# Patient Record
Sex: Male | Born: 1990 | Race: Black or African American | Hispanic: No | Marital: Single | Smoking: Former smoker
Health system: Southern US, Community
[De-identification: ages and names within clinical notes are randomized; demographics above are authoritative.]

## PROBLEM LIST (undated history)

## (undated) DIAGNOSIS — I10 Essential (primary) hypertension: Secondary | ICD-10-CM

## (undated) DIAGNOSIS — I509 Heart failure, unspecified: Secondary | ICD-10-CM

## (undated) HISTORY — PX: THYROID SURGERY: SHX805

---
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
## 2010-06-09 ENCOUNTER — Emergency Department: Payer: Self-pay | Admitting: Emergency Medicine

## 2010-10-12 ENCOUNTER — Emergency Department: Payer: Self-pay | Admitting: Emergency Medicine

## 2011-05-08 ENCOUNTER — Emergency Department: Payer: Self-pay | Admitting: *Deleted

## 2011-05-08 LAB — COMPREHENSIVE METABOLIC PANEL
Albumin: 4.2 g/dL (ref 3.4–5.0)
Alkaline Phosphatase: 54 U/L (ref 50–136)
Anion Gap: 12 (ref 7–16)
Bilirubin,Total: 0.8 mg/dL (ref 0.2–1.0)
Chloride: 103 mmol/L (ref 98–107)
Creatinine: 0.9 mg/dL (ref 0.60–1.30)
EGFR (African American): >60
EGFR (Non-African Amer.): >60
Osmolality: 275 (ref 275–301)
SGOT(AST): 29 U/L (ref 15–37)
SGPT (ALT): 30 U/L
Total Protein: 8.2 g/dL (ref 6.4–8.2)

## 2011-05-08 LAB — URINALYSIS, COMPLETE
Bilirubin,UR: NEGATIVE
Glucose,UR: NEGATIVE mg/dL (ref 0–75)
Nitrite: NEGATIVE
Ph: 6 (ref 4.5–8.0)
Protein: NEGATIVE
Specific Gravity: 1.012 (ref 1.003–1.030)
Squamous Epithelial: 1
WBC UR: 398 /HPF (ref 0–5)

## 2011-05-08 LAB — CBC
HCT: 47.1 % (ref 40.0–52.0)
HGB: 15.6 g/dL (ref 13.0–18.0)
MCH: 28.8 pg (ref 26.0–34.0)
MCHC: 33.2 g/dL (ref 32.0–36.0)
Platelet: 190 10*3/uL (ref 150–440)
RBC: 5.43 10*6/uL (ref 4.40–5.90)
WBC: 14.1 10*3/uL — ABNORMAL HIGH (ref 3.8–10.6)

## 2011-05-10 LAB — URINE CULTURE

## 2011-05-14 LAB — CULTURE, BLOOD (SINGLE)

## 2012-02-02 ENCOUNTER — Emergency Department: Payer: Self-pay | Admitting: Emergency Medicine

## 2012-04-13 ENCOUNTER — Emergency Department: Payer: Self-pay | Admitting: Emergency Medicine

## 2012-04-13 LAB — BASIC METABOLIC PANEL
Anion Gap: 8 (ref 7–16)
BUN: 6 mg/dL — ABNORMAL LOW (ref 7–18)
Chloride: 103 mmol/L (ref 98–107)
Co2: 27 mmol/L (ref 21–32)
EGFR (African American): >60
Potassium: 3.5 mmol/L (ref 3.5–5.1)
Sodium: 138 mmol/L (ref 136–145)

## 2012-04-13 LAB — CBC WITH DIFFERENTIAL/PLATELET
Basophil #: 0.1 10*3/uL (ref 0.0–0.1)
Basophil %: 0.7 %
Eosinophil %: 0.5 %
HCT: 50.1 % (ref 40.0–52.0)
Lymphocyte #: 2.9 10*3/uL (ref 1.0–3.6)
Lymphocyte %: 23.8 %
MCH: 30.4 pg (ref 26.0–34.0)
MCHC: 34.6 g/dL (ref 32.0–36.0)
MCV: 88 fL (ref 80–100)
Monocyte #: 0.7 x10 3/mm (ref 0.2–1.0)
Monocyte %: 6 %
Neutrophil #: 8.3 10*3/uL — ABNORMAL HIGH (ref 1.4–6.5)
Platelet: 216 10*3/uL (ref 150–440)
RBC: 5.71 10*6/uL (ref 4.40–5.90)
RDW: 13.3 % (ref 11.5–14.5)
WBC: 12 10*3/uL — ABNORMAL HIGH (ref 3.8–10.6)

## 2013-04-14 ENCOUNTER — Emergency Department: Payer: Self-pay | Admitting: Emergency Medicine

## 2013-04-19 ENCOUNTER — Other Ambulatory Visit: Payer: Self-pay | Admitting: Ophthalmology

## 2013-04-26 LAB — WOUND CULTURE

## 2014-08-09 ENCOUNTER — Emergency Department
Admission: EM | Admit: 2014-08-09 | Discharge: 2014-08-09 | Disposition: A | Discharge status: Home / Self Care | Payer: Self-pay | Attending: Emergency Medicine | Admitting: Emergency Medicine

## 2014-08-09 ENCOUNTER — Emergency Department: Payer: Medicaid Other

## 2014-08-09 ENCOUNTER — Emergency Department: Payer: Self-pay

## 2014-08-09 ENCOUNTER — Encounter: Payer: Self-pay | Admitting: Emergency Medicine

## 2014-08-09 DIAGNOSIS — Z720 Tobacco use: Secondary | ICD-10-CM | POA: Insufficient documentation

## 2014-08-09 DIAGNOSIS — I10 Essential (primary) hypertension: Secondary | ICD-10-CM | POA: Insufficient documentation

## 2014-08-09 DIAGNOSIS — M79605 Pain in left leg: Secondary | ICD-10-CM | POA: Insufficient documentation

## 2014-08-09 DIAGNOSIS — M79604 Pain in right leg: Secondary | ICD-10-CM | POA: Insufficient documentation

## 2014-08-09 DIAGNOSIS — R0989 Other specified symptoms and signs involving the circulatory and respiratory systems: Secondary | ICD-10-CM | POA: Insufficient documentation

## 2014-08-09 LAB — CBC
HCT: 49 % (ref 40.0–52.0)
Hemoglobin: 16.4 g/dL (ref 13.0–18.0)
MCH: 28.8 pg (ref 26.0–34.0)
MCHC: 33.4 g/dL (ref 32.0–36.0)
MCV: 86.2 fL (ref 80.0–100.0)
PLATELETS: 208 10*3/uL (ref 150–440)
RBC: 5.69 MIL/uL (ref 4.40–5.90)
RDW: 14.1 % (ref 11.5–14.5)
WBC: 10.8 10*3/uL — ABNORMAL HIGH (ref 3.8–10.6)

## 2014-08-09 LAB — COMPREHENSIVE METABOLIC PANEL
ALBUMIN: 3.9 g/dL (ref 3.5–5.0)
ALK PHOS: 46 U/L (ref 38–126)
ALT: 21 U/L (ref 17–63)
AST: 17 U/L (ref 15–41)
Anion gap: 9 (ref 5–15)
BUN: 13 mg/dL (ref 6–20)
CALCIUM: 8.9 mg/dL (ref 8.9–10.3)
CO2: 25 mmol/L (ref 22–32)
Chloride: 105 mmol/L (ref 101–111)
Creatinine, Ser: 1.1 mg/dL (ref 0.61–1.24)
Glucose, Bld: 95 mg/dL (ref 65–99)
Potassium: 3.7 mmol/L (ref 3.5–5.1)
Sodium: 139 mmol/L (ref 135–145)
TOTAL PROTEIN: 7 g/dL (ref 6.5–8.1)
Total Bilirubin: 0.7 mg/dL (ref 0.3–1.2)

## 2014-08-09 LAB — FIBRIN DERIVATIVES D-DIMER (ARMC ONLY): Fibrin derivatives D-dimer (ARMC): 639.36 — ABNORMAL HIGH (ref 0–499)

## 2014-08-09 LAB — BRAIN NATRIURETIC PEPTIDE: B NATRIURETIC PEPTIDE 5: 214 pg/mL — AB (ref 0.0–100.0)

## 2014-08-09 MED ORDER — TRAMADOL HCL 50 MG PO TABS: 50.0000 mg | ORAL_TABLET | Freq: Four times a day (QID) | ORAL | Status: AC | PRN

## 2014-08-09 MED ORDER — KETOROLAC TROMETHAMINE 30 MG/ML IJ SOLN
30.0000 mg | Freq: Once | INTRAMUSCULAR | Status: AC
  Administered 2014-08-09: 30 mg via INTRAMUSCULAR
  Filled 2014-08-09: qty 1

## 2014-08-09 MED ORDER — IPRATROPIUM-ALBUTEROL 0.5-2.5 (3) MG/3ML IN SOLN
3.0000 mL | Freq: Once | RESPIRATORY_TRACT | Status: AC
  Administered 2014-08-09: 3 mL via RESPIRATORY_TRACT
  Filled 2014-08-09: qty 3

## 2014-08-09 MED ORDER — LISINOPRIL 20 MG PO TABS: 20.0000 mg | ORAL_TABLET | Freq: Every day | ORAL | Status: DC

## 2014-08-09 NOTE — ED Provider Notes (Signed)
[facility] Emergency Department Provider Note  ____________________________________________  Time seen: On arrival  I have reviewed the triage vital signs and the nursing notes.   HISTORY  Chief Complaint Shortness of Breath; Knee Pain; and Hemoptysis    HPI Terrance Hawkins is a 24 y.o. male who presents with complaints of bilateral knee pain and shortness of breath which seems to be worse at night. He feels that he is having tremendous sinus drainage at night which is causing him to cough. He notes that he did have a small amount of pinkish blood when he coughed once. He denies chest pain. No fevers no chills. He does smoke. 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 Is likely the cause of his knee pain     History reviewed. No pertinent past medical history.  There are no active problems to display for this patient.   History reviewed. No pertinent past surgical history.  No current outpatient prescriptions on file.  Allergies Review of patient's allergies indicates no known allergies.  History reviewed. No pertinent family history.  Social History History  Substance Use Topics  . Smoking status: Current Every Day Smoker  . Smokeless tobacco: Not on file  . Alcohol Use: Yes    Review of Systems  Constitutional: Negative for fever. Eyes: Negative for visual changes. ENT: Negative for sore throat Cardiovascular: Negative for chest pain. Respiratory: Positive for shortness of breath Gastrointestinal: Negative for abdominal pain, vomiting and diarrhea. Genitourinary: Negative for dysuria. Musculoskeletal: Negative for back pain. Positive for knee pain bilaterally Skin: Negative for rash. Neurological: Negative for headaches or focal weakness Psychiatric: No anxiety  10-point ROS otherwise negative.  ____________________________________________   PHYSICAL EXAM:  VITAL SIGNS: ED Triage  Vitals  Enc Vitals Group     BP 08/09/14 0923 150/115 mmHg     Pulse Rate 08/09/14 0923 95     Resp 08/09/14 0923 20     Temp 08/09/14 0923 98.3 F (36.8 C)     Temp Source 08/09/14 0923 Oral     SpO2 08/09/14 0923 100 %     Weight 08/09/14 0923 300 lb (136.079 kg)     Height 08/09/14 0923 5\' 7"  (1.702 m)     Head Cir --      Peak Flow --      Pain Score 08/09/14 0924 8     Pain Loc --      Pain Edu? --      Excl. in GC? --      Constitutional: Obese. Alert and oriented. Well appearing and in no distress. Eyes: Conjunctivae are normal.  ENT   Head: Normocephalic and atraumatic.   Mouth/Throat: Mucous membranes are moist. Cardiovascular: Normal rate, regular rhythm. Normal and symmetric distal pulses are present in all extremities. No murmurs, rubs, or gallops. Respiratory: Normal respiratory effort without tachypnea nor retractions. Breath sounds are clear and equal bilaterally.  Gastrointestinal: Soft and non-tender in all quadrants. No distention. There is no CVA tenderness. Genitourinary: deferred Musculoskeletal: Nontender with normal range of motion in all extremities. Severe valgus abnormalities of bilateral extremities. 2+ distal pulses bilaterally Neurologic:  Normal speech and language. No gross focal neurologic deficits are appreciated. Skin:  Skin is warm, dry and intact. No rash noted. Psychiatric: Mood and affect are normal. Patient exhibits appropriate insight and judgment.  ____________________________________________    LABS (pertinent positives/negatives)  Labs Reviewed - No data to display  ____________________________________________   EKG  None  ____________________________________________    RADIOLOGY I have personally reviewed any xrays that were ordered on this patient: Enlarged heart and pulmonary vascular congestion ____________________________________________   PROCEDURES  Procedure(s) performed: none  Critical Care  performed: none  ____________________________________________   INITIAL IMPRESSION / ASSESSMENT AND PLAN / ED COURSE  Pertinent labs & imaging results that were available during my care of the patient were reviewed by me and considered in my medical decision making (see chart for details).  Patient well-appearing with no evidence of hypoxia. Differential includes sinusitis, upper respiratory infection, bronchospasm, sleep apnea. We'll give DuoNeb and check a chest x-ray and reevaluate  ----------------------------------------- 12:51 PM on 08/09/2014 -----------------------------------------  Patient with mildly elevated BNP and mildly elevated d-dimer. I do not believe the patient has a blood clot given no tachycardia and symptoms more consistent consistent with pulmonary vascular congestion given PND. Had a long discussion with patient regarding the need for regular visits with his physician, blood pressure control, smoking cessation, diet adjustment and increasing exercise.  ____________________________________________   FINAL CLINICAL IMPRESSION(S) / ED DIAGNOSES  Final diagnoses:  Essential hypertension  Pulmonary vascular congestion  Leg pain, bilateral     Jene Every, MD 08/09/14 1620

## 2014-08-09 NOTE — Discharge Instructions (Signed)

## 2014-08-09 NOTE — ED Notes (Signed)
Pt complaining of right leg pain (offered other means to reduce pain) . Has significant musculoskeletal history/deformity. Will continue to monitor.

## 2014-08-09 NOTE — ED Notes (Signed)
Patient to ED with report of increasing shortness of breath, especially at night when he lays down as well as bilateral knee pain. Reports symptoms have been going on for a couple of weeks.

## 2014-08-12 ENCOUNTER — Emergency Department: Payer: Medicaid Other

## 2014-08-12 ENCOUNTER — Other Ambulatory Visit: Payer: Self-pay

## 2014-08-12 ENCOUNTER — Encounter: Payer: Self-pay | Admitting: Medical Oncology

## 2014-08-12 ENCOUNTER — Emergency Department
Admission: EM | Admit: 2014-08-12 | Discharge: 2014-08-12 | Discharge status: Against Medical Advice | Payer: Medicaid Other | Attending: Emergency Medicine | Admitting: Emergency Medicine

## 2014-08-12 DIAGNOSIS — J810 Acute pulmonary edema: Secondary | ICD-10-CM | POA: Insufficient documentation

## 2014-08-12 DIAGNOSIS — I10 Essential (primary) hypertension: Secondary | ICD-10-CM | POA: Insufficient documentation

## 2014-08-12 DIAGNOSIS — Z720 Tobacco use: Secondary | ICD-10-CM | POA: Insufficient documentation

## 2014-08-12 DIAGNOSIS — I160 Hypertensive urgency: Secondary | ICD-10-CM

## 2014-08-12 DIAGNOSIS — R0600 Dyspnea, unspecified: Secondary | ICD-10-CM

## 2014-08-12 HISTORY — DX: Essential (primary) hypertension: I10

## 2014-08-12 LAB — COMPREHENSIVE METABOLIC PANEL
ALT: 40 U/L (ref 17–63)
AST: 33 U/L (ref 15–41)
Albumin: 3.9 g/dL (ref 3.5–5.0)
Alkaline Phosphatase: 43 U/L (ref 38–126)
Anion gap: 5 (ref 5–15)
BUN: 12 mg/dL (ref 6–20)
CHLORIDE: 110 mmol/L (ref 101–111)
CO2: 24 mmol/L (ref 22–32)
CREATININE: 0.98 mg/dL (ref 0.61–1.24)
Calcium: 8.6 mg/dL — ABNORMAL LOW (ref 8.9–10.3)
GFR calc non Af Amer: >60 mL/min (ref >60)
GLUCOSE: 96 mg/dL (ref 65–99)
POTASSIUM: 3.3 mmol/L — AB (ref 3.5–5.1)
Sodium: 139 mmol/L (ref 135–145)
TOTAL PROTEIN: 6.7 g/dL (ref 6.5–8.1)
Total Bilirubin: 1 mg/dL (ref 0.3–1.2)

## 2014-08-12 LAB — CBC
HCT: 45.5 % (ref 40.0–52.0)
HEMOGLOBIN: 15.3 g/dL (ref 13.0–18.0)
MCH: 28.6 pg (ref 26.0–34.0)
MCHC: 33.6 g/dL (ref 32.0–36.0)
MCV: 85.2 fL (ref 80.0–100.0)
PLATELETS: 208 10*3/uL (ref 150–440)
RBC: 5.34 MIL/uL (ref 4.40–5.90)
RDW: 14 % (ref 11.5–14.5)
WBC: 10.5 10*3/uL (ref 3.8–10.6)

## 2014-08-12 LAB — APTT: aPTT: 31 seconds (ref 24–36)

## 2014-08-12 LAB — TROPONIN I: TROPONIN I: 0.03 ng/mL (ref <0.031)

## 2014-08-12 MED ORDER — LEVOFLOXACIN IN D5W 750 MG/150ML IV SOLN: 750.0000 mg | Freq: Once | INTRAVENOUS | Status: DC

## 2014-08-12 MED ORDER — CLONIDINE HCL 0.1 MG PO TABS
0.1000 mg | ORAL_TABLET | Freq: Once | ORAL | Status: AC
  Administered 2014-08-12: 0.1 mg via ORAL
  Filled 2014-08-12: qty 1

## 2014-08-12 MED ORDER — FUROSEMIDE 40 MG PO TABS
20.0000 mg | ORAL_TABLET | Freq: Once | ORAL | Status: AC
  Administered 2014-08-12: 20 mg via ORAL
  Filled 2014-08-12: qty 1

## 2014-08-12 MED ORDER — LABETALOL HCL 5 MG/ML IV SOLN: 10.0000 mg | Freq: Once | INTRAVENOUS | Status: DC

## 2014-08-12 MED ORDER — FUROSEMIDE 10 MG/ML IJ SOLN: 40.0000 mg | Freq: Once | INTRAMUSCULAR | Status: DC

## 2014-08-12 MED ORDER — FUROSEMIDE 20 MG PO TABS: 20.0000 mg | ORAL_TABLET | Freq: Every day | ORAL | Status: DC

## 2014-08-12 MED ORDER — CLONIDINE HCL 0.1 MG PO TABS: 0.1000 mg | ORAL_TABLET | Freq: Two times a day (BID) | ORAL | Status: DC

## 2014-08-12 MED ORDER — IOHEXOL 350 MG/ML SOLN
100.0000 mL | Freq: Once | INTRAVENOUS | Status: AC | PRN
  Administered 2014-08-12: 100 mL via INTRAVENOUS

## 2014-08-12 NOTE — Discharge Instructions (Signed)
Hypertension °Hypertension, commonly called high blood pressure, is when the force of blood pumping through your arteries is too strong. Your arteries are the blood vessels that carry blood from your heart throughout your body. A blood pressure reading consists of a higher number over a lower number, such as 110/72. The higher number (systolic) is the pressure inside your arteries when your heart pumps. The lower number (diastolic) is the pressure inside your arteries when your heart relaxes. Ideally you want your blood pressure below 120/80. °Hypertension forces your heart to work harder to pump blood. Your arteries may become narrow or stiff. Having hypertension puts you at risk for heart disease, stroke, and other problems.  °RISK FACTORS °Some risk factors for high blood pressure are controllable. Others are not.  °Risk factors you cannot control include:  °· Race. You may be at higher risk if you are African American. °· Age. Risk increases with age. °· Gender. Men are at higher risk than women before age 45 years. After age 65, women are at higher risk than men. °Risk factors you can control include: °· Not getting enough exercise or physical activity. °· Being overweight. °· Getting too much fat, sugar, calories, or salt in your diet. °· Drinking too much alcohol. °SIGNS AND SYMPTOMS °Hypertension does not usually cause signs or symptoms. Extremely high blood pressure (hypertensive crisis) may cause headache, anxiety, shortness of breath, and nosebleed. °DIAGNOSIS  °To check if you have hypertension, your health care provider will measure your blood pressure while you are seated, with your arm held at the level of your heart. It should be measured at least twice using the same arm. Certain conditions can cause a difference in blood pressure between your right and left arms. A blood pressure reading that is higher than normal on one occasion does not mean that you need treatment. If one blood pressure reading  is high, ask your health care provider about having it checked again. °TREATMENT  °Treating high blood pressure includes making lifestyle changes and possibly taking medicine. Living a healthy lifestyle can help lower high blood pressure. You may need to change some of your habits. °Lifestyle changes may include: °· Following the DASH diet. This diet is high in fruits, vegetables, and whole grains. It is low in salt, red meat, and added sugars. °· Getting at least 2½ hours of brisk physical activity every week. °· Losing weight if necessary. °· Not smoking. °· Limiting alcoholic beverages. °· Learning ways to reduce stress. ° If lifestyle changes are not enough to get your blood pressure under control, your health care provider may prescribe medicine. You may need to take more than one. Work closely with your health care provider to understand the risks and benefits. °HOME CARE INSTRUCTIONS °· Have your blood pressure rechecked as directed by your health care provider.   °· Take medicines only as directed by your health care provider. Follow the directions carefully. Blood pressure medicines must be taken as prescribed. The medicine does not work as well when you skip doses. Skipping doses also puts you at risk for problems.   °· Do not smoke.   °· Monitor your blood pressure at home as directed by your health care provider.  °SEEK MEDICAL CARE IF:  °· You think you are having a reaction to medicines taken. °· You have recurrent headaches or feel dizzy. °· You have swelling in your ankles. °· You have trouble with your vision. °SEEK IMMEDIATE MEDICAL CARE IF: °· You develop a severe headache or confusion. °·   You have unusual weakness, numbness, or feel faint.  You have severe chest or abdominal pain.  You vomit repeatedly.  You have trouble breathing. MAKE SURE YOU:   Understand these instructions.  Will watch your condition.  Will get help right away if you are not doing well or get worse. Document  Released: 01/10/2005 Document Revised: 05/27/2013 Document Reviewed: 11/02/2012 [facility] Patient Information 2015 [place], [place]. This information is not intended to replace advice given to you by your health care provider. Make sure you discuss any questions you have with your health care provider.  Pulmonary Edema Pulmonary edema is abnormal fluid buildup in the lungs that can make it hard to breathe. HOME CARE  Talk to your doctor about an exercise program.  Eat a healthy diet:  Eat fresh fruits, vegetables, and lean meats.  Limit high fat and salty foods.  Avoid processed, canned, or fried foods.  Avoid fast food.  Follow your doctor's advice about taking medicine and recording the medicine you take.  Follow your doctor's advice about keeping a record of your weight.  Talk to your doctor about keeping track of your blood pressure.  Do not smoke.  Do not use nicotine patches or nicotine gum.  Make a follow-up appointment with your doctor.  Ask your doctor for a copy of your latest heart tracing (ECG) and keep a copy with you at all times. GET HELP RIGHT AWAY IF:   You have chest pain. THIS IS AN EMERGENCY. Do not wait to see if the pain will go away. Call for local emergency medical help. Do not drive yourself to the hospital.  You have sweating, feel sick to your stomach (nauseous), or are experiencing shortness of breath.  Your weight increases more than your doctor tells you it should.  You start to have shortness of breath.  You notice more swelling in your hands, feet, ankles, or belly.  You have dizziness, blurred vision, headache, or unsteadiness that does not go away.  You cough up bloody spit.  You have a cough that does not go away.  You are unable to sleep because it is hard to breathe.  You begin to feel a "jumping" or "fluttering" sensation (palpitations) in the chest that is unusual for you. MAKE SURE YOU:   Understand these  instructions.  Will watch your condition.  Will get help right away if you are not doing well or get worse. Document Released: 12/29/2008 Document Revised: 01/15/2013 Document Reviewed: 09/17/2012 [facility] Patient Information 2015 [place], [place]. This information is not intended to replace advice given to you by your health care provider. Make sure you discuss any questions you have with your health care provider.

## 2014-08-12 NOTE — ED Provider Notes (Addendum)
[facility] Emergency Department Provider Note     Time seen: ----------------------------------------- 10:21 AM on 08/12/2014 -----------------------------------------    I have reviewed the triage vital signs and the nursing notes.   HISTORY  Chief Complaint Shortness of Breath    HPI Terrance Hawkins is a 24 y.o. male who presents to ER with severe dyspnea on exertion. Patient states she was seen several days ago with shortness of breath and had some blood pressure medication adjustments, he continues to have severe shortness of breath. He has significant orthopnea to the point where he has to sleep sitting up. States his only had a couple doses of lisinopril that he was given a discharge last time. He states the right knee and right leg gives him significant pain, thinks he may be putting on extra fluid.   Past Medical History  Diagnosis Date  . Hypertension     There are no active problems to display for this patient.   History reviewed. No pertinent past surgical history.  Allergies Review of patient's allergies indicates no known allergies.  Social History History  Substance Use Topics  . Smoking status: Current Every Day Smoker  . Smokeless tobacco: Not on file  . Alcohol Use: Yes    Review of Systems Constitutional: Negative for fever. Eyes: Negative for visual changes. ENT: Negative for sore throat. Cardiovascular: Negative for chest pain. Respiratory: Positive for shortness of breath Gastrointestinal: Negative for abdominal pain, vomiting and diarrhea. Genitourinary: Negative for dysuria. Musculoskeletal: Negative for back pain. Positive for leg pain, worse on the right Skin: Negative for rash. Neurological: Negative for headaches, focal weakness or numbness.  10-point ROS otherwise negative.  ____________________________________________   PHYSICAL EXAM:  VITAL SIGNS: ED Triage Vitals  Enc Vitals Group     BP  08/12/14 1015 188/113 mmHg     Pulse Rate 08/12/14 1015 96     Resp 08/12/14 1015 20     Temp 08/12/14 1015 98 F (36.7 C)     Temp Source 08/12/14 1015 Oral     SpO2 08/12/14 1015 96 %     Weight 08/12/14 1015 300 lb (136.079 kg)     Height 08/12/14 1015  (1.702 m)     Head Cir --      Peak Flow --      Pain Score 08/12/14 1016 10     Pain Loc --      Pain Edu? --      Excl. in GC? --     Constitutional: Alert and oriented. Mild distress Eyes: Conjunctivae are normal. PERRL. Normal extraocular movements. ENT   Head: Normocephalic and atraumatic.   Nose: No congestion/rhinnorhea.   Mouth/Throat: Mucous membranes are moist.   Neck: No stridor. Cardiovascular: Normal rate, regular rhythm. Normal and symmetric distal pulses are present in all extremities. No murmurs, rubs, or gallops. Respiratory: Mild tachypnea, Breath sounds are clear and equal bilaterally. No wheezes/rales/rhonchi. Gastrointestinal: Soft and nontender. No distention. No abdominal bruits. There is no CVA tenderness. Musculoskeletal: Nontender with normal range of motion in all extremities. No joint effusions.  No lower extremity tenderness nor edema. Neurologic:  Normal speech and language. No gross focal neurologic deficits are appreciated. Speech is normal. No gait instability. Skin:  Skin is warm, dry and intact. No rash noted. Psychiatric: Mood and affect are normal. Speech and behavior are normal. Patient exhibits appropriate insight and judgment. ____________________________________________  EKG: Interpreted by me. Normal sinus rhythm with a rate of 88 bpm,  septal infarct age indeterminate, normal axis. Normal QT interval.  ____________________________________________  ED COURSE:  Pertinent labs & imaging results that were available during my care of the patient were reviewed by me and considered in my medical decision making (see chart for details). We'll probably go ahead and CT the  chest, recheck cardiac labs. ____________________________________________    LABS (pertinent positives/negatives)  Labs Reviewed  COMPREHENSIVE METABOLIC PANEL - Abnormal; Notable for the following:    Potassium 3.3 (*)    Calcium 8.6 (*)    All other components within normal limits  CBC  TROPONIN I  APTT  BRAIN NATRIURETIC PEPTIDE    RADIOLOGY Images were viewed by me  CT angiogram reveals patchy groundglass consolidations throughout both lungs   ____________________________________________  FINAL ASSESSMENT AND PLAN  Dyspnea and orthopnea, pulmonary edema  Plan: Patient with labs and imaging as dictated above. CT findings would explain his dyspnea. Patient also severely short of breath, with hypertensive urgency. Will start on blood pressure medications as well as Lasix. Patient would likely need observation and echocardiogram.  Patient is advised me he cannot stay in the hospital due to child care needs. I advised him I cannot assure his safety, patient remains very short of breath. He'll be started on clonidine and Lasix here. But he is refusing hospitalization.   Emily FilbertWilliams, [place] E, MD   Emily FilbertJonathan E Pranay Hilbun, MD 08/12/14 [id number]  Emily FilbertJonathan E Nahome Bublitz, MD 08/12/14 1226

## 2014-08-12 NOTE — ED Notes (Signed)
PT ambulatory to triage with reports that he was seen here a few days ago with sob but states that he has continued to have sob. Pt states that he was placed on BP med upon discharge. Denies chest pain.

## 2014-08-12 NOTE — ED Notes (Signed)
Pt states he has been coughing up blood tinged sputum

## 2014-09-07 ENCOUNTER — Emergency Department: Payer: Self-pay

## 2014-09-07 ENCOUNTER — Inpatient Hospital Stay
Admission: EM | Admit: 2014-09-07 | Discharge: 2014-09-09 | DRG: 292 | Disposition: A | Discharge status: Home / Self Care | Payer: Self-pay | Attending: Internal Medicine | Admitting: Internal Medicine

## 2014-09-07 ENCOUNTER — Inpatient Hospital Stay
Admit: 2014-09-07 | Discharge: 2014-09-07 | Disposition: A | Discharge status: Home / Self Care | Payer: Self-pay | Attending: Internal Medicine | Admitting: Internal Medicine

## 2014-09-07 ENCOUNTER — Encounter: Payer: Self-pay | Admitting: Emergency Medicine

## 2014-09-07 DIAGNOSIS — I10 Essential (primary) hypertension: Secondary | ICD-10-CM

## 2014-09-07 DIAGNOSIS — E876 Hypokalemia: Secondary | ICD-10-CM

## 2014-09-07 DIAGNOSIS — Z720 Tobacco use: Secondary | ICD-10-CM

## 2014-09-07 DIAGNOSIS — I429 Cardiomyopathy, unspecified: Secondary | ICD-10-CM | POA: Diagnosis present

## 2014-09-07 DIAGNOSIS — Z9114 Patient's other noncompliance with medication regimen: Secondary | ICD-10-CM | POA: Diagnosis present

## 2014-09-07 DIAGNOSIS — Z6841 Body Mass Index (BMI) 40.0 and over, adult: Secondary | ICD-10-CM

## 2014-09-07 DIAGNOSIS — F1210 Cannabis abuse, uncomplicated: Secondary | ICD-10-CM

## 2014-09-07 DIAGNOSIS — I5041 Acute combined systolic (congestive) and diastolic (congestive) heart failure: Principal | ICD-10-CM

## 2014-09-07 DIAGNOSIS — F1410 Cocaine abuse, uncomplicated: Secondary | ICD-10-CM

## 2014-09-07 DIAGNOSIS — I509 Heart failure, unspecified: Secondary | ICD-10-CM

## 2014-09-07 DIAGNOSIS — R0602 Shortness of breath: Secondary | ICD-10-CM

## 2014-09-07 DIAGNOSIS — J810 Acute pulmonary edema: Secondary | ICD-10-CM

## 2014-09-07 DIAGNOSIS — G4733 Obstructive sleep apnea (adult) (pediatric): Secondary | ICD-10-CM

## 2014-09-07 LAB — COMPREHENSIVE METABOLIC PANEL
ALT: 19 U/L (ref 17–63)
AST: 17 U/L (ref 15–41)
Albumin: 4 g/dL (ref 3.5–5.0)
Alkaline Phosphatase: 45 U/L (ref 38–126)
Anion gap: 7 (ref 5–15)
BUN: 10 mg/dL (ref 6–20)
CALCIUM: 9 mg/dL (ref 8.9–10.3)
CHLORIDE: 106 mmol/L (ref 101–111)
CO2: 26 mmol/L (ref 22–32)
Creatinine, Ser: 1.09 mg/dL (ref 0.61–1.24)
GFR calc Af Amer: >60 mL/min (ref >60)
Glucose, Bld: 96 mg/dL (ref 65–99)
POTASSIUM: 3.3 mmol/L — AB (ref 3.5–5.1)
SODIUM: 139 mmol/L (ref 135–145)
Total Bilirubin: 1.1 mg/dL (ref 0.3–1.2)
Total Protein: 7.1 g/dL (ref 6.5–8.1)

## 2014-09-07 LAB — CBC WITH DIFFERENTIAL/PLATELET
BASOS PCT: 1 %
Basophils Absolute: 0.1 10*3/uL (ref 0–0.1)
EOS PCT: 1 %
Eosinophils Absolute: 0.1 10*3/uL (ref 0–0.7)
HCT: 46.9 % (ref 40.0–52.0)
Hemoglobin: 15.6 g/dL (ref 13.0–18.0)
LYMPHS PCT: 21 %
Lymphs Abs: 2.1 10*3/uL (ref 1.0–3.6)
MCH: 28.4 pg (ref 26.0–34.0)
MCHC: 33.3 g/dL (ref 32.0–36.0)
MCV: 85.5 fL (ref 80.0–100.0)
MONO ABS: 1 10*3/uL (ref 0.2–1.0)
Monocytes Relative: 10 %
NEUTROS ABS: 6.8 10*3/uL — AB (ref 1.4–6.5)
Neutrophils Relative %: 67 %
Platelets: 215 10*3/uL (ref 150–440)
RBC: 5.49 MIL/uL (ref 4.40–5.90)
RDW: 14.1 % (ref 11.5–14.5)
WBC: 10 10*3/uL (ref 3.8–10.6)

## 2014-09-07 LAB — URINE DRUG SCREEN, QUALITATIVE (ARMC ONLY)
AMPHETAMINES, UR SCREEN: NOT DETECTED
Barbiturates, Ur Screen: NOT DETECTED
Benzodiazepine, Ur Scrn: NOT DETECTED
Cannabinoid 50 Ng, Ur ~~LOC~~: POSITIVE — AB
Cocaine Metabolite,Ur ~~LOC~~: POSITIVE — AB
MDMA (Ecstasy)Ur Screen: NOT DETECTED
METHADONE SCREEN, URINE: NOT DETECTED
OPIATE, UR SCREEN: NOT DETECTED
Phencyclidine (PCP) Ur S: NOT DETECTED
Tricyclic, Ur Screen: NOT DETECTED

## 2014-09-07 LAB — TROPONIN I: TROPONIN I: 0.03 ng/mL (ref <0.031)

## 2014-09-07 LAB — TSH: TSH: 0.824 u[IU]/mL (ref 0.350–4.500)

## 2014-09-07 LAB — BRAIN NATRIURETIC PEPTIDE: B NATRIURETIC PEPTIDE 5: 267 pg/mL — AB (ref 0.0–100.0)

## 2014-09-07 LAB — HEMOGLOBIN A1C: Hgb A1c MFr Bld: 5.3 % (ref 4.0–6.0)

## 2014-09-07 MED ORDER — FUROSEMIDE 10 MG/ML IJ SOLN
20.0000 mg | Freq: Once | INTRAMUSCULAR | Status: AC
  Administered 2014-09-07: 20 mg via INTRAVENOUS
  Filled 2014-09-07: qty 4

## 2014-09-07 MED ORDER — FUROSEMIDE 20 MG PO TABS
20.0000 mg | ORAL_TABLET | Freq: Two times a day (BID) | ORAL | Status: DC
  Administered 2014-09-07 – 2014-09-08 (×2): 20 mg via ORAL
  Filled 2014-09-07 (×2): qty 1

## 2014-09-07 MED ORDER — SODIUM CHLORIDE 0.9 % IJ SOLN
3.0000 mL | INTRAMUSCULAR | Status: DC | PRN
  Administered 2014-09-08 – 2014-09-09 (×2): 3 mL via INTRAVENOUS
  Filled 2014-09-07 (×2): qty 10

## 2014-09-07 MED ORDER — ACETAMINOPHEN 650 MG RE SUPP: 650.0000 mg | Freq: Four times a day (QID) | RECTAL | Status: DC | PRN

## 2014-09-07 MED ORDER — SODIUM CHLORIDE 0.9 % IJ SOLN
3.0000 mL | Freq: Two times a day (BID) | INTRAMUSCULAR | Status: DC
  Administered 2014-09-07: 3 mL via INTRAVENOUS

## 2014-09-07 MED ORDER — NITROGLYCERIN 2 % TD OINT
1.0000 [in_us] | TOPICAL_OINTMENT | Freq: Four times a day (QID) | TRANSDERMAL | Status: DC
  Administered 2014-09-07 – 2014-09-09 (×5): 1 [in_us] via TOPICAL
  Filled 2014-09-07 (×5): qty 1

## 2014-09-07 MED ORDER — NICOTINE 21 MG/24HR TD PT24
21.0000 mg | MEDICATED_PATCH | Freq: Every day | TRANSDERMAL | Status: DC
  Administered 2014-09-07 – 2014-09-09 (×3): 21 mg via TRANSDERMAL
  Filled 2014-09-07 (×3): qty 1

## 2014-09-07 MED ORDER — HEPARIN SODIUM (PORCINE) 5000 UNIT/ML IJ SOLN
5000.0000 [IU] | Freq: Three times a day (TID) | INTRAMUSCULAR | Status: DC
  Administered 2014-09-07 – 2014-09-09 (×5): 5000 [IU] via SUBCUTANEOUS
  Filled 2014-09-07 (×5): qty 1

## 2014-09-07 MED ORDER — DOCUSATE SODIUM 100 MG PO CAPS
100.0000 mg | ORAL_CAPSULE | Freq: Two times a day (BID) | ORAL | Status: DC
  Administered 2014-09-07 – 2014-09-09 (×4): 100 mg via ORAL
  Filled 2014-09-07 (×5): qty 1

## 2014-09-07 MED ORDER — LABETALOL HCL 5 MG/ML IV SOLN
INTRAVENOUS | Status: AC
  Administered 2014-09-07: 10 mg via INTRAVENOUS
  Filled 2014-09-07: qty 4

## 2014-09-07 MED ORDER — POTASSIUM CHLORIDE CRYS ER 20 MEQ PO TBCR
40.0000 meq | EXTENDED_RELEASE_TABLET | Freq: Two times a day (BID) | ORAL | Status: DC
  Administered 2014-09-08 – 2014-09-09 (×3): 40 meq via ORAL
  Filled 2014-09-07 (×3): qty 2

## 2014-09-07 MED ORDER — FUROSEMIDE 20 MG PO TABS
20.0000 mg | ORAL_TABLET | Freq: Every day | ORAL | Status: DC
  Administered 2014-09-07: 20 mg via ORAL
  Filled 2014-09-07: qty 1

## 2014-09-07 MED ORDER — ONDANSETRON HCL 4 MG PO TABS: 4.0000 mg | ORAL_TABLET | Freq: Four times a day (QID) | ORAL | Status: DC | PRN

## 2014-09-07 MED ORDER — ACETAMINOPHEN 325 MG PO TABS
650.0000 mg | ORAL_TABLET | Freq: Four times a day (QID) | ORAL | Status: DC | PRN
  Administered 2014-09-08: 650 mg via ORAL
  Filled 2014-09-07: qty 2

## 2014-09-07 MED ORDER — LISINOPRIL 20 MG PO TABS
20.0000 mg | ORAL_TABLET | Freq: Two times a day (BID) | ORAL | Status: DC
  Administered 2014-09-07 – 2014-09-09 (×4): 20 mg via ORAL
  Filled 2014-09-07 (×4): qty 1

## 2014-09-07 MED ORDER — CARVEDILOL 12.5 MG PO TABS
12.5000 mg | ORAL_TABLET | Freq: Two times a day (BID) | ORAL | Status: DC
  Administered 2014-09-07 – 2014-09-08 (×2): 12.5 mg via ORAL
  Filled 2014-09-07 (×2): qty 1

## 2014-09-07 MED ORDER — ONDANSETRON HCL 4 MG/2ML IJ SOLN: 4.0000 mg | Freq: Four times a day (QID) | INTRAMUSCULAR | Status: DC | PRN

## 2014-09-07 MED ORDER — MORPHINE SULFATE 2 MG/ML IJ SOLN
2.0000 mg | INTRAMUSCULAR | Status: DC | PRN
  Administered 2014-09-07 – 2014-09-09 (×3): 2 mg via INTRAVENOUS
  Filled 2014-09-07 (×3): qty 1

## 2014-09-07 MED ORDER — LABETALOL HCL 5 MG/ML IV SOLN
10.0000 mg | Freq: Once | INTRAVENOUS | Status: AC
Start: 2014-09-07 — End: 2014-09-07
  Administered 2014-09-07: 10 mg via INTRAVENOUS

## 2014-09-07 MED ORDER — POTASSIUM CHLORIDE CRYS ER 20 MEQ PO TBCR
40.0000 meq | EXTENDED_RELEASE_TABLET | Freq: Once | ORAL | Status: AC
  Administered 2014-09-07: 40 meq via ORAL
  Filled 2014-09-07: qty 2

## 2014-09-07 MED ORDER — CLONIDINE HCL 0.1 MG PO TABS: 0.1000 mg | ORAL_TABLET | Freq: Two times a day (BID) | ORAL | Status: DC

## 2014-09-07 MED ORDER — LISINOPRIL 20 MG PO TABS
20.0000 mg | ORAL_TABLET | Freq: Every day | ORAL | Status: DC
  Administered 2014-09-07: 20 mg via ORAL
  Filled 2014-09-07: qty 1

## 2014-09-07 NOTE — H&P (Addendum)
[facility] [place] [facility]   PATIENT NAME: Terrance Hawkins    MR#:  [id number]  DATE OF BIRTH:  1990/09/03  SUBJECTIVE:  CHIEF COMPLAINT:   Chief Complaint  Patient presents with  . Shortness of Breath   patient is a 24 year old African American male testicle history significant for laser of nontreated obstructive sleep apnea, history of hypertension, obesity who presents to the hospital with complaints of significant shortness of breath, lower extremity swelling as well as orthopnea and PND. Chest x-ray reveals congestive heart failure and he is admitted for diuresis. Feels a little bit better today. Admits of having some chest pains prior to coming to the hospital, radiating to the left neck area  Review of Systems  Constitutional: Negative for fever, chills and weight loss.  HENT: Negative for congestion.   Eyes: Negative for blurred vision and double vision.  Respiratory: Positive for cough and shortness of breath. Negative for sputum production and wheezing.   Cardiovascular: Positive for chest pain, orthopnea, leg swelling and PND. Negative for palpitations.  Gastrointestinal: Negative for nausea, vomiting, abdominal pain, diarrhea, constipation and blood in stool.  Genitourinary: Negative for dysuria, urgency, frequency and hematuria.  Musculoskeletal: Negative for falls.  Neurological: Negative for dizziness, tremors, focal weakness and headaches.  Endo/Heme/Allergies: Does not bruise/bleed easily.  Psychiatric/Behavioral: Negative for depression. The patient does not have insomnia.     VITAL SIGNS: Blood pressure 171/89, pulse 89, temperature 97.7 F (36.5 C), temperature source Oral, resp. rate 16, height  (1.702 m), weight 136.079 kg (300 lb), SpO2 100 %.  PHYSICAL EXAMINATION:   GENERAL:  24 y.o.-year-old obese younger patient sitting in the bed in mild respiratory distress.  EYES: Pupils equal, round, reactive to light and  accommodation. No scleral icterus. Extraocular muscles intact.  HEENT: Head atraumatic, normocephalic. Oropharynx and nasopharynx clear.  NECK:  Supple, no jugular venous distention. No thyroid enlargement, no tenderness.  LUNGS: He managed breath sounds bilaterally at bases, no wheezing, few rales and crepitations in the lung fields bilaterally. Dermatology uses accessory muscles of respiration especially on exertion.  CARDIOVASCULAR: S1, S2 normal. No murmurs, rubs, or gallops.  ABDOMEN: Soft, nontender, nondistended. Bowel sounds present. No organomegaly or mass.  EXTREMITIES: No pedal edema, cyanosis, or clubbing.  NEUROLOGIC: Cranial nerves II through XII are intact. Muscle strength 5/5 in all extremities. Sensation intact. Gait not checked.  PSYCHIATRIC: The patient is alert and oriented x 3.  SKIN: No obvious rash, lesion, or ulcer.   ORDERS/RESULTS REVIEWED:   CBC  Recent Labs Lab 09/07/14 0324  WBC 10.0  HGB 15.6  HCT 46.9  PLT 215  MCV 85.5  MCH 28.4  MCHC 33.3  RDW 14.1  LYMPHSABS 2.1  MONOABS 1.0  EOSABS 0.1  BASOSABS 0.1   ------------------------------------------------------------------------------------------------------------------  Chemistries   Recent Labs Lab 09/07/14 0324  NA 139  K 3.3*  CL 106  CO2 26  GLUCOSE 96  BUN 10  CREATININE 1.09  CALCIUM 9.0  AST 17  ALT 19  ALKPHOS 45  BILITOT 1.1   ------------------------------------------------------------------------------------------------------------------ estimated creatinine clearance is 137.9 mL/min (by C-G formula based on Cr of 1.09). ------------------------------------------------------------------------------------------------------------------  Recent Labs  09/07/14 0324  TSH 0.824    Cardiac Enzymes  Recent Labs Lab 09/07/14 0324  TROPONINI 0.03    ------------------------------------------------------------------------------------------------------------------ Invalid input(s): POCBNP ---------------------------------------------------------------------------------------------------------------  RADIOLOGY: Dg Chest 2 View  09/07/2014   CLINICAL DATA:  Acute onset of left-sided chest pain, radiating to the neck. Shortness of  breath. Initial encounter.  EXAM: CHEST  2 VIEW  COMPARISON:  Chest radiograph performed 08/09/2014, and CTA of the chest performed 08/12/2014  FINDINGS: The lungs are well-aerated. Vascular congestion is noted. Increased interstitial markings raise concern for mild interstitial edema. There is no evidence of pleural effusion or pneumothorax.  The heart is mildly enlarged. No acute osseous abnormalities are seen.  IMPRESSION: Vascular congestion and mild cardiomegaly. Increased interstitial markings raise concern for mild interstitial edema.   Electronically Signed   By: Roanna Raider M.D.   On: 09/07/2014 04:17    EKG:  Orders placed or performed during the hospital encounter of 09/07/14  . ED EKG  . ED EKG  . EKG 12-Lead  . EKG 12-Lead    ASSESSMENT AND PLAN:  Active Problems:   CHF (congestive heart failure) 1.  acute pulmonary edema due to acute CHF, continue diuresis with Lasix, start patient on nitroglycerin to control his blood pressure better, follow ins and outs, as well as weight. 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 Obstructive sleep apnea. Patient was advised to get sleep study ASAP. He will be referred to Dr. Zenon Mayo office that as a sleep study upon discharge 4. Hypokalemia, supplement orally 5. Obesity. Check TSH as well as lipid panel 6. Tobacco abuse. Discussed this patient approximately 5 minutes. Nicotine replacement therapy will be  initiated   Management plans discussed with the patient, family and they are in agreement.   DRUG ALLERGIES: No Known Allergies  CODE STATUS:     Code Status Orders        Start     Ordered   09/07/14 0949  Full code   Continuous     09/07/14 0948      TOTAL TIME TAKING CARE OF THIS PATIENT: 45 minutes. Discussed extensively with family members and patient explained obstructive sleep apnea and the congestive heart failure relationship. All questions were answered, the family as well as patient voiced understanding. Time spent approximately 15 minutes.  Katharina Caper M.D on 09/07/2014 at 3:49 PM  Between 7am to 6pm - Pager - [phone]  After 6pm go to [email or web address] - password [email or web address] [facility]  [place] [facility]  Office  [phone]  CC: Primary care physician; No PCP Per Patient

## 2014-09-07 NOTE — ED Provider Notes (Signed)
[facility] Emergency Department Provider Note  ____________________________________________  Time seen: Approximately 5:00 AM  I have reviewed the triage vital signs and the nursing notes.   HISTORY  Chief Complaint Shortness of Breath    HPI Terrance Hawkins is a 24 y.o. male with history of hypertensionpresents with over one month of worsening thoughts apnea and dyspnea on exertion, constant since onset. Patient was seen in this emergency department on 08/12/2014 for similar symptoms. He had a CTA of the chest that was negative for PE however it did show pulmonary opacities which were concerning for edema. In addition, his BNP was elevated. He was offered admission but was unable to stay due to inability to arrange child care. He was discharged with Lasix and his symptoms initially improved however he has run out of Lasix and his symptoms have recurred. He has mild intermittent chest tightness, noncardiac. No cough, sneezing, runny nose, congestion, vomiting, diarrhea, fevers or chills. Current severity of symptoms is mild to moderate.   Past Medical History  Diagnosis Date  . Hypertension     There are no active problems to display for this patient.   History reviewed. No pertinent past surgical history.  Current Outpatient Rx  Name  Route  Sig  Dispense  Refill  . cloNIDine (CATAPRES) 0.1 MG tablet   Oral   Take 1 tablet (0.1 mg total) by mouth 2 (two) times daily.   60 tablet   11   . furosemide (LASIX) 20 MG tablet   Oral   Take 1 tablet (20 mg total) by mouth daily.   7 tablet   0   . lisinopril (PRINIVIL,ZESTRIL) 20 MG tablet   Oral   Take 1 tablet (20 mg total) by mouth daily.   30 tablet   11   . traMADol (ULTRAM) 50 MG tablet   Oral   Take 1 tablet (50 mg total) by mouth every 6 (six) hours as needed.   20 tablet   0     Allergies Review of patient's allergies indicates no known allergies.  History reviewed. No pertinent  family history.  Social History Social History  Substance Use Topics  . Smoking status: Current Every Day Smoker  . Smokeless tobacco: None  . Alcohol Use: Yes    Review of Systems Constitutional: No fever/chills Eyes: No visual changes. ENT: No sore throat. Cardiovascular: + chest pain. Respiratory: +shortness of breath. Gastrointestinal: No abdominal pain.  No nausea, no vomiting.  No diarrhea.  No constipation. Genitourinary: Negative for dysuria. Musculoskeletal: Negative for back pain. Skin: Negative for rash. Neurological: Negative for headaches, focal weakness or numbness.  10-point ROS otherwise negative.  ____________________________________________   PHYSICAL EXAM:  VITAL SIGNS: ED Triage Vitals  Enc Vitals Group     BP 09/07/14 0252 161/104 mmHg     Pulse Rate 09/07/14 0252 99     Resp 09/07/14 0252 22     Temp 09/07/14 0252 98.4 F (36.9 C)     Temp Source 09/07/14 0252 Oral     SpO2 09/07/14 0252 97 %     Weight 09/07/14 0252 300 lb (136.079 kg)     Height 09/07/14 0252 5\' 7"  (1.702 m)     Head Cir --      Peak Flow --      Pain Score 09/07/14 0314 0     Pain Loc --      Pain Edu? --      Excl. in GC? --  Constitutional: Alert and oriented. Chronically ill-appearing in no acute distress. Morbidly obese. Eyes: Conjunctivae are normal. PERRL. EOMI. Head: Atraumatic. Nose: No congestion/rhinnorhea. Mouth/Throat: Mucous membranes are moist.  Oropharynx non-erythematous. Neck: No stridor.  Cardiovascular: Normal rate, regular rhythm. Grossly normal heart sounds.  Good peripheral circulation. Respiratory: Normal respiratory effort.  No retractions. Diminished breath sounds in bilateral bases. Mild tachypnea. Gastrointestinal: Soft and nontender. No distention. No abdominal bruits. No CVA tenderness. Genitourinary: Deferred Musculoskeletal: No lower extremity tenderness nor edema.  No joint effusions. Neurologic:  Normal speech and language. No  gross focal neurologic deficits are appreciated. No gait instability. Skin:  Skin is warm, dry and intact. No rash noted. Psychiatric: Mood and affect are normal. Speech and behavior are normal.  ____________________________________________   LABS (all labs ordered are listed, but only abnormal results are displayed)  Labs Reviewed  CBC WITH DIFFERENTIAL/PLATELET - Abnormal; Notable for the following:    Neutro Abs 6.8 (*)    All other components within normal limits  COMPREHENSIVE METABOLIC PANEL - Abnormal; Notable for the following:    Potassium 3.3 (*)    All other components within normal limits  BRAIN NATRIURETIC PEPTIDE - Abnormal; Notable for the following:    B Natriuretic Peptide 267.0 (*)    All other components within normal limits  TROPONIN I   ____________________________________________  EKG  ED ECG REPORT I, Gayla Doss, the attending physician, personally viewed and interpreted this ECG.   Date: 09/07/2014  EKG Time: 03:17  Rate: 92  Rhythm: normal sinus rhythm  Axis: normal  Intervals:none  ST&T Change: No acute ST segment elevation. Q waves in V2, V3  ____________________________________________  RADIOLOGY  CXR  FINDINGS: The lungs are well-aerated. Vascular congestion is noted. Increased interstitial markings raise concern for mild interstitial edema. There is no evidence of pleural effusion or pneumothorax.  The heart is mildly enlarged. No acute osseous abnormalities are seen.  IMPRESSION: Vascular congestion and mild cardiomegaly. Increased interstitial markings raise concern for mild interstitial edema.  ____________________________________________   PROCEDURES  Procedure(s) performed: None  Critical Care performed: No  ____________________________________________   INITIAL IMPRESSION / ASSESSMENT AND PLAN / ED COURSE  Pertinent labs & imaging results that were available during my care of the patient were reviewed by me  and considered in my medical decision making (see chart for details).  Terrance Hawkins is a 24 y.o. male with history of hypertensionpresents with over one month of worsening thoughts apnea and dyspnea on exertion, constant since onset. On exam, he is chronically ill-appearing, mildly tachypneic with diminished breath sounds at bilateral bases. Labs notable for  BNP elevation and chest x-ray concerning for mild interstitial edema. Suspect congestive heart failure exacerbation as the mostly because of his symptoms. He had CTA of the chest last month which was negative for PE, making this diagnosis less likely. Troponin negative. We'll give IV Lasix. Discussed with hospitalist for admission. ____________________________________________   FINAL CLINICAL IMPRESSION(S) / ED DIAGNOSES  Final diagnoses:  Acute exacerbation of CHF (congestive heart failure)  SOB (shortness of breath)      Gayla Doss, MD 09/07/14 [phone]

## 2014-09-07 NOTE — ED Notes (Signed)
Pt's monitor alarming; sats 84% on room air; pt sleeping upon entering room; awakened; sat up; sats improved to 96%

## 2014-09-07 NOTE — Progress Notes (Signed)
Room air. NSR. Pt has not reported any pain. Nicotine patch placed. Takes meds ok. Up in room and tolerated it well. UA sent. EF 40-45%. A & o. K was PO replaced. Pt has no further concerns at this time.

## 2014-09-07 NOTE — Progress Notes (Signed)
Skin checked off by Allayne Butcher RN

## 2014-09-07 NOTE — Consult Note (Signed)
[facility] CLINIC CARDIOLOGY [facility]  CARDIOLOGY CONSULT NOTE  Patient ID: HAIM HANSSON MRN: [id number] DOB/AGE: 17-Aug-1990 24 y.o.  Admit date: 8/14/2016chf/ Referring Physician dr Winona Legato Primary Physician [facility] Primary Cardiologist   Reason for Consultation chf  HPI: Patient is an  24 year old African American male with history of hypertension and a cardiomyopathy.  He has a history hypertension.  He admits to noncompliance with his medications he also eats a high sodium diet.  He has not been on any of his medications recently.  He presented to the emergency room l with shortness of breath and fatigue.  Chest x-ray revealed volume overload with cardiomegaly.  He was placed on antihypertensive is and started back on diuretics.  He has improved somewhat.  He has some atypical chest pain and has ruled out for myocardial infarction.  Echocardiogram revealed mild-to-moderate reduced LV function EF 40 45% globally.  There is trivial to mild MR and TR.  There is no regional wall motion abnormalities.  ROS Review of Systems - General ROS: positive for  - fatigue and weight gain Respiratory ROS: positive for - shortness of breath Cardiovascular ROS: positive for - chest pain, dyspnea on exertion and edema Gastrointestinal ROS: no abdominal pain, change in bowel habits, or black or bloody stools Neurological ROS: no TIA or stroke symptoms  Past Medical History  Diagnosis Date  . Hypertension     History reviewed. No pertinent family history.  Social History   Social History  . Marital Status: Single    Spouse Name: N/A  . Number of Children: N/A  . Years of Education: N/A   Occupational History  . Not on file.   Social History Main Topics  . Smoking status: Current Every Day Smoker  . Smokeless tobacco: Not on file  . Alcohol Use: Yes  . Drug Use: Yes    Special: Marijuana  . Sexual Activity: Not on file   Other Topics Concern  . Not on file    Social History Narrative    History reviewed. No pertinent past surgical history.   Prescriptions prior to admission  Medication Sig Dispense Refill Last Dose  . cloNIDine (CATAPRES) 0.1 MG tablet Take 1 tablet (0.1 mg total) by mouth 2 (two) times daily. 60 tablet 11 09/06/2014 at Unknown time  . furosemide (LASIX) 20 MG tablet Take 1 tablet (20 mg total) by mouth daily. 7 tablet 0 Past Week at Unknown time  . lisinopril (PRINIVIL,ZESTRIL) 20 MG tablet Take 1 tablet (20 mg total) by mouth daily. 30 tablet 11 09/06/2014 at Unknown time  . traMADol (ULTRAM) 50 MG tablet Take 1 tablet (50 mg total) by mouth every 6 (six) hours as needed. 20 tablet 0 Past Week at Unknown time    Physical Exam: Blood pressure 171/89, pulse 89, temperature 97.7 F (36.5 C), temperature source Oral, resp. rate 16, height  (1.702 m), weight 136.079 kg (300 lb), SpO2 100 %.    General appearance: alert and cooperative Resp: rhonchi bilaterally Cardio: regular rate and rhythm, S1, S2 normal, no murmur, click, rub or gallop GI: soft, non-tender; bowel sounds normal; no masses,  no organomegaly Extremities: extremities normal, atraumatic, no cyanosis or edema Neurologic: Grossly normal Labs:   Lab Results  Component Value Date   WBC 10.0 09/07/2014   HGB 15.6 09/07/2014   HCT 46.9 09/07/2014   MCV 85.5 09/07/2014   PLT 215 09/07/2014    Recent Labs Lab 09/07/14 0324  NA 139  K 3.3*  CL 106  CO2 26  BUN 10  CREATININE 1.09  CALCIUM 9.0  PROT 7.1  BILITOT 1.1  ALKPHOS 45  ALT 19  AST 17  GLUCOSE 96   Lab Results  Component Value Date   TROPONINI 0.03 09/07/2014      Radiology:   Cardiomegaly with mild volume overload EKG:  Sinus rhythm with no ischemia ASSESSMENT AND PLAN:   Patient with history hypertension non compliant with his medications EKG reveals evidence of sinus rhythm.   mild volume overload.  We discussed importance compliance with his medications.  Will get him back on  diuretic and anti hypertensives.  Stressed importance of low-fat low-sodium diet.  Patient expresses understanding.  ECHO showed mildly reduced LV function.  Does not appear to require ischemic evaluation at present.  Would continue to carefully diurese discharge when stable with outpatient follow-up.  Patient has [place] heart Association class 3 systolic  /diastolic heart failure. Signed: Dalia Heading MD, [facility] 09/07/2014, 4:07 PM

## 2014-09-07 NOTE — Progress Notes (Signed)
*  PRELIMINARY RESULTS* Echocardiogram 2D Echocardiogram has been performed.  Garrel Ridgel Stills 09/07/2014, 11:58 AM

## 2014-09-07 NOTE — Progress Notes (Signed)
MD vaickute was notified of K was 3.3 and order was to give  K once and then bid.

## 2014-09-07 NOTE — ED Notes (Signed)
Patient reports feeling short of breath.  Reports being seen in the ED approximately 1 month ago for the same, but did not seek follow up.

## 2014-09-08 LAB — CBC
HCT: 47.5 % (ref 40.0–52.0)
HEMOGLOBIN: 16 g/dL (ref 13.0–18.0)
MCH: 29.1 pg (ref 26.0–34.0)
MCHC: 33.7 g/dL (ref 32.0–36.0)
MCV: 86.2 fL (ref 80.0–100.0)
Platelets: 198 10*3/uL (ref 150–440)
RBC: 5.51 MIL/uL (ref 4.40–5.90)
RDW: 14.2 % (ref 11.5–14.5)
WBC: 9.9 10*3/uL (ref 3.8–10.6)

## 2014-09-08 LAB — LIPID PANEL
CHOL/HDL RATIO: 5.2 ratio
CHOLESTEROL: 150 mg/dL (ref 0–200)
HDL: 29 mg/dL — AB (ref >40)
LDL Cholesterol: 95 mg/dL (ref 0–99)
TRIGLYCERIDES: 132 mg/dL (ref <150)
VLDL: 26 mg/dL (ref 0–40)

## 2014-09-08 LAB — TSH: TSH: 2.106 u[IU]/mL (ref 0.350–4.500)

## 2014-09-08 LAB — BASIC METABOLIC PANEL
Anion gap: 8 (ref 5–15)
BUN: 11 mg/dL (ref 6–20)
CHLORIDE: 105 mmol/L (ref 101–111)
CO2: 27 mmol/L (ref 22–32)
Calcium: 8.7 mg/dL — ABNORMAL LOW (ref 8.9–10.3)
Creatinine, Ser: 1.04 mg/dL (ref 0.61–1.24)
GFR calc Af Amer: >60 mL/min (ref >60)
GFR calc non Af Amer: >60 mL/min (ref >60)
GLUCOSE: 92 mg/dL (ref 65–99)
POTASSIUM: 3.5 mmol/L (ref 3.5–5.1)
Sodium: 140 mmol/L (ref 135–145)

## 2014-09-08 LAB — HEMOGLOBIN A1C: Hgb A1c MFr Bld: 5.3 % (ref 4.0–6.0)

## 2014-09-08 MED ORDER — HYDROCODONE-ACETAMINOPHEN 5-325 MG PO TABS
1.0000 | ORAL_TABLET | ORAL | Status: DC | PRN
  Administered 2014-09-08 (×2): 1 via ORAL
  Filled 2014-09-08 (×3): qty 1

## 2014-09-08 MED ORDER — CARVEDILOL 25 MG PO TABS
25.0000 mg | ORAL_TABLET | Freq: Two times a day (BID) | ORAL | Status: DC
  Administered 2014-09-08 – 2014-09-09 (×2): 25 mg via ORAL
  Filled 2014-09-08 (×2): qty 1

## 2014-09-08 MED ORDER — FUROSEMIDE 10 MG/ML IJ SOLN
20.0000 mg | Freq: Two times a day (BID) | INTRAMUSCULAR | Status: DC
  Administered 2014-09-08 – 2014-09-09 (×2): 20 mg via INTRAVENOUS
  Filled 2014-09-08 (×2): qty 2

## 2014-09-08 NOTE — Progress Notes (Signed)
[facility] - [place] at [facility]   PATIENT NAME: Terrance Hawkins    MR#:  [id number]  DATE OF BIRTH:  November 25, 1990  SUBJECTIVE:  CHIEF COMPLAINT:   Chief Complaint  Patient presents with  . Shortness of Breath   patient is a 24 year old African American male testicle history significant for laser of nontreated obstructive sleep apnea, history of hypertension, obesity who presents to the hospital with complaints of significant shortness of breath, lower extremity swelling as well as orthopnea and PND. Chest x-ray reveals congestive heart failure and he is admitted for diuresis. Feels a little bit better today. Admits of having some chest pains prior to coming to the hospital, radiating to the left neck area Shortness of breath is not improving, still has significant swelling in lower extremities. Urine drug screen is positive for cocaine as well as marijuana.  Review of Systems  Constitutional: Negative for fever, chills and weight loss.  HENT: Negative for congestion.   Eyes: Negative for blurred vision and double vision.  Respiratory: Positive for cough and shortness of breath. Negative for sputum production and wheezing.   Cardiovascular: Positive for chest pain, orthopnea, leg swelling and PND. Negative for palpitations.  Gastrointestinal: Negative for nausea, vomiting, abdominal pain, diarrhea, constipation and blood in stool.  Genitourinary: Negative for dysuria, urgency, frequency and hematuria.  Musculoskeletal: Negative for falls.  Neurological: Negative for dizziness, tremors, focal weakness and headaches.  Endo/Heme/Allergies: Does not bruise/bleed easily.  Psychiatric/Behavioral: Negative for depression. The patient does not have insomnia.     VITAL SIGNS: Blood pressure 149/93, pulse 90, temperature 97.6 F (36.4 C), temperature source Oral, resp. rate 20, height 5\' 7"  (1.702 m), weight 136.261 kg (300 lb 6.4 oz), SpO2 95 %.  PHYSICAL  EXAMINATION:   GENERAL:  24 y.o.-year-old obese younger patient sitting in the bed in mild respiratory distress.  EYES: Pupils equal, round, reactive to light and accommodation. No scleral icterus. Extraocular muscles intact.  HEENT: Head atraumatic, normocephalic. Oropharynx and nasopharynx clear.  NECK:  Supple, no jugular venous distention. No thyroid enlargement, no tenderness.  LUNGS: Diminished breath sounds bilaterally at bases, no wheezing bilaterally. Intermittently uses accessory muscles of respiration especially on exertion.  CARDIOVASCULAR: S1, S2 normal. No murmurs, rubs, or gallops.  ABDOMEN: Soft, nontender, nondistended. Bowel sounds present. No organomegaly or mass.  EXTREMITIES: No pedal edema, cyanosis, or clubbing.  NEUROLOGIC: Cranial nerves II through XII are intact. Muscle strength 5/5 in all extremities. Sensation intact. Gait not checked.  PSYCHIATRIC: The patient is alert and oriented x 3.  SKIN: No obvious rash, lesion, or ulcer. Right lower extremity periosteal tenderness on palpation of tibial bone   ORDERS/RESULTS REVIEWED:   CBC  Recent Labs Lab 09/07/14 0324 09/08/14 0502  WBC 10.0 9.9  HGB 15.6 16.0  HCT 46.9 47.5  PLT 215 198  MCV 85.5 86.2  MCH 28.4 29.1  MCHC 33.3 33.7  RDW 14.1 14.2  LYMPHSABS 2.1  --   MONOABS 1.0  --   EOSABS 0.1  --   BASOSABS 0.1  --    ------------------------------------------------------------------------------------------------------------------  Chemistries   Recent Labs Lab 09/07/14 0324 09/08/14 0502  NA 139 140  K 3.3* 3.5  CL 106 105  CO2 26 27  GLUCOSE 96 92  BUN 10 11  CREATININE 1.09 1.04  CALCIUM 9.0 8.7*  AST 17  --   ALT 19  --   ALKPHOS 45  --   BILITOT 1.1  --    ------------------------------------------------------------------------------------------------------------------  estimated creatinine clearance is 144.7 mL/min (by C-G formula based on Cr of  1.04). ------------------------------------------------------------------------------------------------------------------  Recent Labs  09/08/14 0502  TSH 2.106    Cardiac Enzymes  Recent Labs Lab 09/07/14 0324  TROPONINI 0.03   ------------------------------------------------------------------------------------------------------------------ Invalid input(s): POCBNP ---------------------------------------------------------------------------------------------------------------  RADIOLOGY: Dg Chest 2 View  09/07/2014   CLINICAL DATA:  Acute onset of left-sided chest pain, radiating to the neck. Shortness of breath. Initial encounter.  EXAM: CHEST  2 VIEW  COMPARISON:  Chest radiograph performed 08/09/2014, and CTA of the chest performed 08/12/2014  FINDINGS: The lungs are well-aerated. Vascular congestion is noted. Increased interstitial markings raise concern for mild interstitial edema. There is no evidence of pleural effusion or pneumothorax.  The heart is mildly enlarged. No acute osseous abnormalities are seen.  IMPRESSION: Vascular congestion and mild cardiomegaly. Increased interstitial markings raise concern for mild interstitial edema.   Electronically Signed   By: Roanna Raider M.D.   On: 09/07/2014 04:17    EKG:  Orders placed or performed during the hospital encounter of 09/07/14  . ED EKG  . ED EKG  . EKG 12-Lead  . EKG 12-Lead    ASSESSMENT AND PLAN:  Active Problems:   CHF (congestive heart failure) 1.  acute pulmonary edema due to acute combined systolic as well as diastolic CHF, continue diuresis with Lasix, changed from by mouth to IV, continue patient on nitroglycerin topically to control his blood pressure better, follow ins and outs, as well as weight. Net negative of  approximately 700 mL echocardiogram observed and appreciate cardiologist input, follow up with cardiologist as outpatient 2. Malignant hypertension , continue for now patient on nitroglycerin  topically, continue diuresis , now on advanced ACE inhibitor dose, follow creatinine closely, check tomorrow morning 3. Obstructive sleep apnea. Patient was advised to get sleep study ASAP. He will be referred to Dr. Zenon Mayo office that as a sleep study upon discharge 4. Hypokalemia, supplemented orally, resolved 5. Obesity. Check TSH as well as lipid panel and normal, LDL 95, triglycerides 132 6. Tobacco abuse. Discussed this patient approximately 5 minutes. Nicotine replacement therapy to be continued 8 . Cocaine as well as marijuana abuse. Discussed with patient extensively about cardiac risks. All questions were answered, voiced understanding   Management plans discussed with the patient, family and they are in agreement.   DRUG ALLERGIES: No Known Allergies  CODE STATUS:     Code Status Orders        Start     Ordered   09/07/14 0949  Full code   Continuous     09/07/14 0948      TOTAL TIME TAKING CARE OF THIS PATIENT: 45 minutes. Discussed extensively with family members and patient about the risks, using substances, including cocaine, discharge planning was also discussed  Orlanda Frankum M.D on 09/08/2014 at 3:05 PM  Between 7am to 6pm - Pager - [phone]  After 6pm go to [email or web address] - password [email or web address]  [place] [facility]  Office  [phone]  CC: Primary care physician; No PCP Per Patient

## 2014-09-08 NOTE — Care Management (Signed)
Patient from home alone.  States that he does not have health insurance and is unemployed.  Patient states that he has used [facility] before in the past but stopped going due to copays.  I provided patient a copy of the [facility] application, as well as an application to medication management.  Patient states that he will complete and submit the packs.  Patient states that he has applied for disability, but has not yet been approved.

## 2014-09-08 NOTE — Progress Notes (Signed)
Initial Nutrition Assessment     INTERVENTION:   Education: provided verbal/written education on heart healthy diet; discussed sources of sodium, limiting total fat but in particular saturated and trans fat. Reviewed shopping and cooking tips, discussed how to eat healthy when eating out as pt reports he eats out a lot. Encouraged pt to not use salt shaker, pt states he does not. Pt appeared fairly receptive to education, although very sleepy on visit. Fair adherance likely Meals and Snacks: Cater to patient preferences   NUTRITION DIAGNOSIS:   Food and nutrition related knowledge deficit related to  (newly dx CHF) as evidenced by  (no prior education on heart healthy diet).  GOAL:   Patient will meet greater than or equal to 90% of their needs  MONITOR:    (Energy Intake, Anthropometrics, Electrolyte/Renal profile, Knowledge)  REASON FOR ASSESSMENT:   Diagnosis    ASSESSMENT:    Pt admitted with acute CHF, EF 40-45 %, malignant HTN. Per MD note, pt had not been taking HTN meds, eating high sodium diet  Past Medical History  Diagnosis Date  . Hypertension      Diet Order:  Diet Heart Room service appropriate?: Yes; Fluid consistency:: Thin   Energy Intake: recorded po intake 100% of meals  Food and nutrition related history: pt reports good appetite prior to admission Electrolyte and Renal Profile:  Recent Labs Lab 09/07/14 0324 09/08/14 0502  BUN 10 11  CREATININE 1.09 1.04  NA 139 140  K 3.3* 3.5   Glucose Profile: No results for input(s): GLUCAP in the last 72 hours. Lipid Profile:     Component Value Date/Time   CHOL 150 09/08/2014 0502   TRIG 132 09/08/2014 0502   HDL 29* 09/08/2014 0502   CHOLHDL 5.2 09/08/2014 0502   VLDL 26 09/08/2014 0502   LDLCALC 95 09/08/2014 0502    Meds: lasix, coalce  Nutrition Focused Physical Exam: Nutrition-Focused physical exam completed. Findings are WDL for fat depletion, muscle depletion. Mild edema.   Height:    Ht Readings from Last 1 Encounters:  09/07/14  (1.702 m)    Weight:   Wt Readings from Last 1 Encounters:  09/08/14 300 lb 6.4 oz (136.261 kg)    Ideal Body Weight:   67 kg  BMI:  Body mass index is 47.04 kg/(m^2).  Estimated Nutritional Needs:   Kcal:  1681-2010 kcals   Protein:  67-80 g (1.0-1.2 g/kg)   Fluid:  1680-2010 mL  EDUCATION NEEDS:   Education needs addressed  MODERATE Care Level  Terrance Starcher MS, RD, LDN [phone] Pager

## 2014-09-08 NOTE — Plan of Care (Signed)
Problem: Consults Goal: Tobacco Cessation referral if indicated Outcome: Progressing Handouts given  Problem: Phase II Progression Outcomes Goal: Case manager referral Outcome: Progressing ordered

## 2014-09-09 DIAGNOSIS — I5041 Acute combined systolic (congestive) and diastolic (congestive) heart failure: Secondary | ICD-10-CM

## 2014-09-09 DIAGNOSIS — E876 Hypokalemia: Secondary | ICD-10-CM

## 2014-09-09 DIAGNOSIS — Z720 Tobacco use: Secondary | ICD-10-CM

## 2014-09-09 DIAGNOSIS — I10 Essential (primary) hypertension: Secondary | ICD-10-CM

## 2014-09-09 DIAGNOSIS — G4733 Obstructive sleep apnea (adult) (pediatric): Secondary | ICD-10-CM

## 2014-09-09 DIAGNOSIS — J810 Acute pulmonary edema: Secondary | ICD-10-CM

## 2014-09-09 DIAGNOSIS — F1210 Cannabis abuse, uncomplicated: Secondary | ICD-10-CM

## 2014-09-09 DIAGNOSIS — F1410 Cocaine abuse, uncomplicated: Secondary | ICD-10-CM

## 2014-09-09 LAB — BASIC METABOLIC PANEL
Anion gap: 6 (ref 5–15)
BUN: 12 mg/dL (ref 6–20)
CHLORIDE: 106 mmol/L (ref 101–111)
CO2: 28 mmol/L (ref 22–32)
Calcium: 9 mg/dL (ref 8.9–10.3)
Creatinine, Ser: 1.06 mg/dL (ref 0.61–1.24)
GFR calc Af Amer: >60 mL/min (ref >60)
GFR calc non Af Amer: >60 mL/min (ref >60)
GLUCOSE: 98 mg/dL (ref 65–99)
POTASSIUM: 3.7 mmol/L (ref 3.5–5.1)
Sodium: 140 mmol/L (ref 135–145)

## 2014-09-09 MED ORDER — CARVEDILOL 25 MG PO TABS
25.0000 mg | ORAL_TABLET | Freq: Two times a day (BID) | ORAL | Status: DC
Start: 2014-09-09 — End: 2017-03-20

## 2014-09-09 MED ORDER — GUAIFENESIN 100 MG/5ML PO SOLN
10.0000 mL | ORAL | Status: DC | PRN
  Administered 2014-09-09: 200 mg via ORAL
  Filled 2014-09-09 (×2): qty 10

## 2014-09-09 MED ORDER — CLONIDINE HCL 0.1 MG PO TABS: 0.1000 mg | ORAL_TABLET | Freq: Two times a day (BID) | ORAL | Status: DC

## 2014-09-09 MED ORDER — NICOTINE 21 MG/24HR TD PT24: 21.0000 mg | MEDICATED_PATCH | Freq: Every day | TRANSDERMAL | Status: DC

## 2014-09-09 NOTE — Care Management (Signed)
Requested by MD to review medication management and [facility] with patient's significant other.  Significant other at bedside, with patient's permission I reviewed the packets.  Significant other and patient agree to complete the paper work and submit them at the time of DC

## 2014-09-09 NOTE — Progress Notes (Signed)
Pt discharged to home via wc.  Instructions  given to pt.  Questions answered.  No distress.  

## 2014-09-09 NOTE — Discharge Instructions (Signed)
Heart Failure Clinic appointment on September 26, 2014 at 9:00am with Clarisa Kindred, FNP. Please call [phone] to reschedule.

## 2014-09-09 NOTE — Discharge Summary (Signed)
[facility] - [place] at [facility]   PATIENT NAME: Terrance Hawkins    MR#:  [id number]  DATE OF BIRTH:  10-06-1990  DATE OF ADMISSION:  09/07/2014 ADMITTING PHYSICIAN: Arnaldo Natal, MD  DATE OF DISCHARGE: 09/09/2014 12:42 PM  PRIMARY CARE PHYSICIAN: No PCP Per Patient     ADMISSION DIAGNOSIS:  SOB (shortness of breath) [R06.02] Acute exacerbation of CHF (congestive heart failure) [I50.9]  DISCHARGE DIAGNOSIS:  Active Problems:   CHF (congestive heart failure)   Acute pulmonary edema   Acute combined systolic and diastolic CHF, NYHA class 1   Essential hypertension, malignant   Morbid obesity   Obstructive sleep apnea   Tobacco abuse   Cocaine abuse   Hypokalemia   Marijuana abuse   Knee pain   SECONDARY DIAGNOSIS:   Past Medical History  Diagnosis Date  . Hypertension     .pro HOSPITAL COURSE:   Patient is 24 year old African-American male with medical history significant for history of not treated obstructive sleep apnea, hypertension, obesity who came to the hospital with complaints of some shortness of breath, lower extremity swelling, orthopnea as well as PND. Patient's x-ray revealed congestive heart failure and patient was admitted to the hospital for diuresis. With diuresis, patient lost approximately 3 L of fluids with which is condition improved somewhat. He had echocardiogram done while in the hospital and cardiology consultation . Echocardiogram revealed mild mitral regurgitation, LVH and ejection fraction of 40-45%. Patient was advised to follow-up with primary care physician to establish primary care physician. Stop smoking. Get obsessive sleep apnea testing and management she is blood pressure and stop cocaine abuse Discussion by problem 1. acute pulmonary edema due to acute combined systolic as well as diastolic CHF, continue diuresis with Lasix orally, echocardiogram observed and appreciate cardiologist input, follow up with  cardiologist as outpatient, ins and outs negative approximately 3 L 2. Malignant hypertension , continue for now patient on nitroglycerin topically, continue diuresis , now on advanced ACE inhibitor dose, follow creatinine closely, check tomorrow morning 3. Obstructive sleep apnea. Patient was advised to get sleep study ASAP. He is referred to Dr. Zenon Mayo office upon discharge 4. Hypokalemia, supplemented orally, resolved 5. Obesity. TSH was okay as well as lipid panel , LDL 95, triglycerides 132. Weight loss was discussed 6. Tobacco abuse. Discussed this patient approximately 5 minutes in the past. Nicotine replacement therapy to be continued at home 8 . Cocaine as well as marijuana abuse. Discussed with patient extensively about cardiac risks. All questions were answered, voiced understanding DISCHARGE CONDITIONS:   Stable  CONSULTS OBTAINED:     DRUG ALLERGIES:  No Known Allergies  DISCHARGE MEDICATIONS:   Discharge Medication List as of 09/09/2014 12:01 PM    START taking these medications   Details  carvedilol (COREG) 25 MG tablet Take 1 tablet (25 mg total) by mouth 2 (two) times daily with a meal., Starting 09/09/2014, Until Discontinued, Normal    nicotine (NICODERM CQ - DOSED IN MG/24 HOURS) 21 mg/24hr patch Place 1 patch (21 mg total) onto the skin daily., Starting 09/09/2014, Until Discontinued, Normal      CONTINUE these medications which have NOT CHANGED   Details  furosemide (LASIX) 20 MG tablet Take 1 tablet (20 mg total) by mouth daily., Starting 08/12/2014, Until Wed 08/12/15, Print    lisinopril (PRINIVIL,ZESTRIL) 20 MG tablet Take 1 tablet (20 mg total) by mouth daily., Starting 08/09/2014, Until Sun 08/09/15, Print    traMADol (ULTRAM) 50 MG tablet  Take 1 tablet (50 mg total) by mouth every 6 (six) hours as needed., Starting 08/09/2014, Until Sun 08/09/15, Print      STOP taking these medications     cloNIDine (CATAPRES) 0.1 MG tablet          DISCHARGE  INSTRUCTIONS:    [facility],  cardiologist as outpatient as well as Dr. Milta Deiters office for obstructive sleep apnea testing  If you experience worsening of your admission symptoms, develop shortness of breath, life threatening emergency, suicidal or homicidal thoughts you must seek medical attention immediately by calling 911 or calling your MD immediately  if symptoms less severe.  You Must read complete instructions/literature along with all the possible adverse reactions/side effects for all the Medicines you take and that have been prescribed to you. Take any new Medicines after you have completely understood and accept all the possible adverse reactions/side effects.   Please note  You were cared for by a hospitalist during your hospital stay. If you have any questions about your discharge medications or the care you received while you were in the hospital after you are discharged, you can call the unit and asked to speak with the hospitalist on call if the hospitalist that took care of you is not available. Once you are discharged, your primary care physician will handle any further medical issues. Please note that NO REFILLS for any discharge medications will be authorized once you are discharged, as it is imperative that you return to your primary care physician (or establish a relationship with a primary care physician if you do not have one) for your aftercare needs so that they can reassess your need for medications and monitor your lab values.    Today   CHIEF COMPLAINT:   Chief Complaint  Patient presents with  . Shortness of Breath    HISTORY OF PRESENT ILLNESS:  Terrance Hawkins  is a 24 y.o. male with a known history of not treated obstructive sleep apnea, hypertension, obesity who came to the hospital with complaints of some shortness of breath, lower extremity swelling, orthopnea as well as PND. Patient's x-ray revealed congestive heart failure and  patient was admitted to the hospital for diuresis. With diuresis, patient lost approximately 3 L of fluids with which is condition improved somewhat. He had echocardiogram done while in the hospital and cardiology consultation . Echocardiogram revealed mild mitral regurgitation, LVH and ejection fraction of 40-45%. Patient was advised to follow-up with primary care physician to establish primary care physician. Stop smoking. Get obsessive sleep apnea testing and management she is blood pressure and stop cocaine abuse Discussion by problem 1. acute pulmonary edema due to acute combined systolic as well as diastolic CHF, continue diuresis with Lasix orally, echocardiogram observed and appreciate cardiologist input, follow up with cardiologist as outpatient, ins and outs negative approximately 3 L 2. Malignant hypertension , continue for now patient on nitroglycerin topically, continue diuresis , now on advanced ACE inhibitor dose, follow creatinine closely, check tomorrow morning 3. Obstructive sleep apnea. Patient was advised to get sleep study ASAP. He is referred to Dr. Zenon Mayo office upon discharge 4. Hypokalemia, supplemented orally, resolved 5. Obesity. TSH was okay as well as lipid panel , LDL 95, triglycerides 132. Weight loss was discussed 6. Tobacco abuse. Discussed this patient approximately 5 minutes in the past. Nicotine replacement therapy to be continued at home 8 . Cocaine as well as marijuana abuse. Discussed with patient extensively about cardiac risks. All questions  were answered, voiced understanding    VITAL SIGNS:  Blood pressure 153/98, pulse 83, temperature 97.8 F (36.6 C), temperature source Oral, resp. rate 20, height 5\' 7"  (1.702 m), weight 134.265 kg (296 lb), SpO2 96 %.  I/O:   Intake/Output Summary (Last 24 hours) at 09/09/14 1701 Last data filed at 09/09/14 1014  Gross per 24 hour  Intake    893 ml  Output   2425 ml  Net  -1532 ml    PHYSICAL EXAMINATION:   GENERAL:  24 y.o.-year-old patient lying in the bed with no acute distress. Morbidly obese EYES: Pupils equal, round, reactive to light and accommodation. No scleral icterus. Extraocular muscles intact.  HEENT: Head atraumatic, normocephalic. Oropharynx and nasopharynx clear.  NECK:  Supple, no jugular venous distention. No thyroid enlargement, no tenderness.  LUNGS: Normal breath sounds bilaterally, no wheezing, rales,rhonchi or crepitation. No use of accessory muscles of respiration.  CARDIOVASCULAR: S1, S2 normal. No murmurs, rubs, or gallops.  ABDOMEN: Soft, non-tender, non-distended. Bowel sounds present. No organomegaly or mass.  EXTREMITIES: 1+ tibial and pedal edema, cyanosis, or clubbing.  NEUROLOGIC: Cranial nerves II through XII are intact. Muscle strength 5/5 in all extremities. Sensation intact. Gait not checked.  PSYCHIATRIC: The patient is alert and oriented x 3.  SKIN: No obvious rash, lesion, or ulcer.   DATA REVIEW:   CBC  Recent Labs Lab 09/08/14 0502  WBC 9.9  HGB 16.0  HCT 47.5  PLT 198    Chemistries   Recent Labs Lab 09/07/14 0324  09/09/14 0401  NA 139  < > 140  K 3.3*  < > 3.7  CL 106  < > 106  CO2 26  < > 28  GLUCOSE 96  < > 98  BUN 10  < > 12  CREATININE 1.09  < > 1.06  CALCIUM 9.0  < > 9.0  AST 17  --   --   ALT 19  --   --   ALKPHOS 45  --   --   BILITOT 1.1  --   --   < > = values in this interval not displayed.  Cardiac Enzymes  Recent Labs Lab 09/07/14 0324  TROPONINI 0.03    Microbiology Results  Results for orders placed or performed in visit on 04/19/13  Wound culture     Status: None   Collection Time: 04/19/13  8:36 AM  Result Value Ref Range Status   Micro Text Report   Final       SOURCE: R EYE    COMMENT                   NORMAL FLORA PRESENT   COMMENT                   NO ANAEROBES ISOLATED IN 4 DAYS   GRAM STAIN                RARE WHITE BLOOD CELLS   GRAM STAIN                NO ORGANISMS SEEN   ANTIBIOTIC                                                         RADIOLOGY:  No results found.  EKG:  Orders placed or performed during the hospital encounter of 09/07/14  . ED EKG  . ED EKG  . EKG 12-Lead  . EKG 12-Lead      Management plans discussed with the patient, family and they are in agreement.  CODE STATUS:   TOTAL TIME TAKING CARE OF THIS PATIENT: 40 minutes.    Katharina Caper M.D on 09/09/2014 at 5:01 PM  Between 7am to 6pm - Pager - [phone]  After 6pm go to [email or web address] - password [email or web address] [facility]  [facility]  Office  [phone]  CC: Primary care physician; No PCP Per Patient

## 2014-09-09 NOTE — Progress Notes (Signed)
Patient was made an initial appointment at the outpatient [facility] on September 26, 2014 at 9:00am. Thank you.

## 2014-09-09 NOTE — Progress Notes (Signed)
Informed Dr.Hower about patient productive cough, thick greenish phlegm. New order for guaifenesin 10ml Q4 HRS

## 2014-09-09 NOTE — Plan of Care (Signed)
Problem: Consults Goal: Tobacco Cessation referral if indicated Outcome: Progressing On nicoderm patch

## 2014-09-15 DIAGNOSIS — I429 Cardiomyopathy, unspecified: Secondary | ICD-10-CM | POA: Insufficient documentation

## 2014-09-15 DIAGNOSIS — R609 Edema, unspecified: Secondary | ICD-10-CM | POA: Insufficient documentation

## 2014-09-23 NOTE — Progress Notes (Signed)
 Chief Complaint: Chief Complaint  Patient presents with  . Hospital Follow Up  . Shortness of Breath    I still have some   its not much better from the hosp   . Edema    I think the right one has some fluid  . Pain    in my knees   Date of Service: 09/15/2014 Date of Birth: 07/30/1990 PCP: NONE  History of Present Illness: Terrance Hawkins is a 24 y.o.male patient who presents for evaluation of shortness of breath and lower extremity edema.  Was seen in the hospital with complaints of hypertension and shortness of breath.  Had been not compliant with some of his medications.  States he is more compliant with this now.  He still has shortness of breath and edema.  He is on carvedilol  and furosemide  as well as lisinopril .  He states he has stop smoking and is on nicotine  patch.  He attempts to eat a low-sodium diet.  He still complains of shortness of breath.  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  His ejection fraction is around 40-45 %.  Past Medical and Surgical History  Past Medical History Past Medical History  Diagnosis Date  . Hypertension   . Sleep apnea   . Thyroid disease   . Cardiomyopathy, secondary     Past Surgical History He has past surgical history that includes parathryoid.   Medications and Allergies  Current Medications  Current Outpatient Prescriptions  Medication Sig Dispense Refill  . carvedilol  (COREG ) 25 MG tablet Take by mouth.    . FUROsemide  (LASIX ) 40 MG tablet Take 1 tablet (40 mg total) by mouth once daily. 30 tablet 11  . lisinopril  (PRINIVIL ,ZESTRIL ) 20 MG tablet Take by mouth.    . nicotine  (NICODERM CQ ) 21 mg/24 hr patch Place onto the skin.    . traMADol  (ULTRAM ) 50 mg tablet Take by mouth.     No current facility-administered medications for this visit.    Allergies: Review of patient's allergies indicates no known allergies.  Social and Family History  Social History  reports that he has quit  smoking. He does not have any smokeless tobacco history on file. He reports that he drinks about 1.2 oz of alcohol per week. He reports that he uses illicit drugs.  Family History Family History  Problem Relation Age of Onset  . Kidney disease Mother   . No Known Problems Father   . No Known Problems Sister   . No Known Problems Brother   . No Known Problems Sister   . No Known Problems Brother     Review of Systems  Review of Systems  Constitutional: Positive for malaise/fatigue. Negative for fever, chills, weight loss and diaphoresis.  HENT: Negative for congestion, ear discharge, hearing loss and tinnitus.   Eyes: Negative for blurred vision.  Respiratory: Positive for shortness of breath. Negative for cough, hemoptysis, sputum production and wheezing.   Cardiovascular: Negative for chest pain, palpitations, orthopnea, claudication, leg swelling and PND.  Gastrointestinal: Negative for heartburn, nausea, vomiting, abdominal pain, diarrhea, constipation, blood in stool and melena.  Genitourinary: Negative for dysuria, urgency, frequency and hematuria.  Musculoskeletal: Negative for myalgias, back pain, joint pain and falls.  Skin: Negative for itching and rash.  Neurological: Negative for dizziness, tingling, focal weakness, loss of consciousness, weakness and headaches.  Endo/Heme/Allergies: Negative for polydipsia. Does not bruise/bleed easily.  Psychiatric/Behavioral: Negative for depression, memory loss and substance abuse. The patient is not nervous/anxious.  Physical Examination   Vitals:BP 142/80 mmHg  Pulse 60  Resp 10  Ht 170.2 cm (5' 7)  Wt 135.626 kg (299 lb)  BMI 46.82 kg/m2 Ht:170.2 cm (5' 7) Wt:135.626 kg (299 lb) ADJ:Anib surface area is 2.53 meters squared. Body mass index is 46.82 kg/(m^2).  Wt Readings from Last 3 Encounters:  09/15/14 135.626 kg (299 lb)  12/21/07 130.3 kg (287 lb 4.2 oz) (100 %*, Z = 2.91)   * Growth percentiles are based on CDC  2-20 Years data.    BP Readings from Last 3 Encounters:  09/15/14 142/80  04/21/14 197/135  12/21/07 143/78    General appearance  appears  in no acute distress  Head Mouth and Eye exam  Normocephalic, without obvious abnormality, atraumatic  Dentition is good  Eyes appear anicteric    Neck exam  Thyroid: normal    Nodes: no obvious adenopathy  LUNGS  Breath Sounds: Normal  Percussion: Normal  CARDIOVASCULAR  JVP   CV wave: no   HJR: no   Elevation at 90 degrees: None  Carotid   Pulse: normal pulsation bilaterally   Bruit:  None   Apex: apical impulse normal     Auscultation   Rhythm: normal sinus rhythm   S1: normal   S2: normal   Clicks: no   Rub: no   Murmurs: 2/6  medium pitched mid systolic blowing at lower left sternal border    Gallop: None ABDOMEN  Liver enlargement: no  Pulsatile aorta: no  Ascites: no  Bruits: no  EXTREMITIES  Clubbing: no  Edema: 1+ bilateral pedal edema  Pulses: peripheral pulses symmetrical  Femoral Bruits: no  Amputation: no SKIN  Rash: no  Cyanosis: no  Embolic phemonenon:  no  Bruising:  no NEURO  Alert and Oriented to person, place and time:  yes  Non focal:  yes  PSYCH:  Pt appears to have normal affect   LABS REVIEWED Last 3 CBC results: Lab Results  Component Value Date   WBC 8.2 02/18/2005   Lab Results  Component Value Date   HGB 15.4 02/18/2005   Lab Results  Component Value Date   HCT 0.44 02/18/2005    Lab Results  Component Value Date   PLT 225 02/18/2005    Lab Results  Component Value Date   CREATININE 0.6 12/21/2007   BUN 8 12/21/2007   NA 138 12/21/2007   K 4.1 12/21/2007   CL 106 12/21/2007   CO2 24 12/21/2007    Lab Results  Component Value Date   HGBA1C 5.2 12/21/2007    No results found for: HDL No results found for: LDLCALC No results found for: TRIG   Lab Results  Component Value Date   ALT 20 12/21/2007   AST 19 12/21/2007   ALKPHOS 42 12/21/2007       Assessment and Plan   24 y.o. male with    ICD-10-CM ICD-9-CM   1. Chronic edema continue with current meds and furosemide  at 40 mg daily.  Low-sodium diet.  Compliance with medication recommended. R60.9 782.3 FUROsemide  (LASIX ) 40 MG tablet  2. Essential hypertension-as per above I10 401.9   3. Cardiomyopathy-continue with carvedilol  lisinopril  as well as diuresis.  Low-sodium diet compliance with medications I42.9 425.4       Return in about 3 months (around 12/16/2014).   These notes generated with voice recognition software.  I apologize for typographical errors.  VINIE DALE JUDE, MD

## 2014-09-26 ENCOUNTER — Encounter: Payer: Self-pay | Admitting: Family

## 2014-09-26 ENCOUNTER — Ambulatory Visit: Payer: PRIVATE HEALTH INSURANCE | Attending: Family | Admitting: Family

## 2014-09-26 VITALS — BP 180/110 | HR 72 | Resp 20 | Ht 67.0 in | Wt 292.0 lb

## 2014-09-26 DIAGNOSIS — R0683 Snoring: Secondary | ICD-10-CM | POA: Diagnosis not present

## 2014-09-26 DIAGNOSIS — M25561 Pain in right knee: Secondary | ICD-10-CM | POA: Insufficient documentation

## 2014-09-26 DIAGNOSIS — M25562 Pain in left knee: Secondary | ICD-10-CM | POA: Insufficient documentation

## 2014-09-26 DIAGNOSIS — Z79899 Other long term (current) drug therapy: Secondary | ICD-10-CM | POA: Diagnosis not present

## 2014-09-26 DIAGNOSIS — F32A Depression, unspecified: Secondary | ICD-10-CM | POA: Insufficient documentation

## 2014-09-26 DIAGNOSIS — F17210 Nicotine dependence, cigarettes, uncomplicated: Secondary | ICD-10-CM | POA: Insufficient documentation

## 2014-09-26 DIAGNOSIS — I5032 Chronic diastolic (congestive) heart failure: Secondary | ICD-10-CM | POA: Insufficient documentation

## 2014-09-26 DIAGNOSIS — G4730 Sleep apnea, unspecified: Secondary | ICD-10-CM | POA: Diagnosis not present

## 2014-09-26 DIAGNOSIS — R0602 Shortness of breath: Secondary | ICD-10-CM | POA: Insufficient documentation

## 2014-09-26 DIAGNOSIS — G4733 Obstructive sleep apnea (adult) (pediatric): Secondary | ICD-10-CM

## 2014-09-26 DIAGNOSIS — I10 Essential (primary) hypertension: Secondary | ICD-10-CM | POA: Insufficient documentation

## 2014-09-26 DIAGNOSIS — Z720 Tobacco use: Secondary | ICD-10-CM

## 2014-09-26 DIAGNOSIS — F329 Major depressive disorder, single episode, unspecified: Secondary | ICD-10-CM | POA: Insufficient documentation

## 2014-09-26 DIAGNOSIS — I5022 Chronic systolic (congestive) heart failure: Secondary | ICD-10-CM | POA: Insufficient documentation

## 2014-09-26 NOTE — Patient Instructions (Addendum)
Continue weighing daily and call for an overnight weight gain of > 2 pounds or a weekly weight gain of >5 pounds.  Low-Sodium Eating Plan Sodium raises blood pressure and causes water to be held in the body. Getting less sodium from food will help lower your blood pressure, reduce any swelling, and protect your heart, liver, and kidneys. We get sodium by adding salt (sodium chloride) to food. Most of our sodium comes from canned, boxed, and frozen foods. Restaurant foods, fast foods, and pizza are also very high in sodium. Even if you take medicine to lower your blood pressure or to reduce fluid in your body, getting less sodium from your food is important. WHAT IS MY PLAN? Most people should limit their sodium intake to 2,300 mg a day. Your health care Sherrise Liberto recommends that you limit your sodium intake to __2000mg__ a day.  WHAT DO I NEED TO KNOW ABOUT THIS EATING PLAN? For the low-sodium eating plan, you will follow these general guidelines:  Choose foods with a % Daily Value for sodium of less than 5% (as listed on the food label).   Use salt-free seasonings or herbs instead of table salt or sea salt.   Check with your health care Ethel Meisenheimer or pharmacist before using salt substitutes.   Eat fresh foods.  Eat more vegetables and fruits.  Limit canned vegetables. If you do use them, rinse them well to decrease the sodium.   Limit cheese to 1 oz (28 g) per day.   Eat lower-sodium products, often labeled as "lower sodium" or "no salt added."  Avoid foods that contain monosodium glutamate (MSG). MSG is sometimes added to Chinese food and some canned foods.  Check food labels (Nutrition Facts labels) on foods to learn how much sodium is in one serving.  Eat more home-cooked food and less restaurant, buffet, and fast food.  When eating at a restaurant, ask that your food be prepared with less salt or none, if possible.  HOW DO I READ FOOD LABELS FOR SODIUM INFORMATION? The  Nutrition Facts label lists the amount of sodium in one serving of the food. If you eat more than one serving, you must multiply the listed amount of sodium by the number of servings. Food labels may also identify foods as:  Sodium free--Less than 5 mg in a serving.  Very low sodium--35 mg or less in a serving.  Low sodium--140 mg or less in a serving.  Light in sodium--50% less sodium in a serving. For example, if a food that usually has 300 mg of sodium is changed to become light in sodium, it will have 150 mg of sodium.  Reduced sodium--25% less sodium in a serving. For example, if a food that usually has 400 mg of sodium is changed to reduced sodium, it will have 300 mg of sodium. WHAT FOODS CAN I EAT? Grains Low-sodium cereals, including oats, puffed wheat and rice, and shredded wheat cereals. Low-sodium crackers. Unsalted rice and pasta. Lower-sodium bread.  Vegetables Frozen or fresh vegetables. Low-sodium or reduced-sodium canned vegetables. Low-sodium or reduced-sodium tomato sauce and paste. Low-sodium or reduced-sodium tomato and vegetable juices.  Fruits Fresh, frozen, and canned fruit. Fruit juice.  Meat and Other Protein Products Low-sodium canned tuna and salmon. Fresh or frozen meat, poultry, seafood, and fish. Lamb. Unsalted nuts. Dried beans, peas, and lentils without added salt. Unsalted canned beans. Homemade soups without salt. Eggs.  Dairy Milk. Soy milk. Ricotta cheese. Low-sodium or reduced-sodium cheeses. Yogurt.  Condiments Fresh   and dried herbs and spices. Salt-free seasonings. Onion and garlic powders. Low-sodium varieties of mustard and ketchup. Lemon juice.  Fats and Oils Reduced-sodium salad dressings. Unsalted butter.  Other Unsalted popcorn and pretzels.  The items listed above may not be a complete list of recommended foods or beverages. Contact your dietitian for more options. WHAT FOODS ARE NOT RECOMMENDED? Grains Instant hot cereals.  Bread stuffing, pancake, and biscuit mixes. Croutons. Seasoned rice or pasta mixes. Noodle soup cups. Boxed or frozen macaroni and cheese. Self-rising flour. Regular salted crackers. Vegetables Regular canned vegetables. Regular canned tomato sauce and paste. Regular tomato and vegetable juices. Frozen vegetables in sauces. Salted french fries. Olives. Pickles. Relishes. Sauerkraut. Salsa. Meat and Other Protein Products Salted, canned, smoked, spiced, or pickled meats, seafood, or fish. Bacon, ham, sausage, hot dogs, corned beef, chipped beef, and packaged luncheon meats. Salt pork. Jerky. Pickled herring. Anchovies, regular canned tuna, and sardines. Salted nuts. Dairy Processed cheese and cheese spreads. Cheese curds. Blue cheese and cottage cheese. Buttermilk.  Condiments Onion and garlic salt, seasoned salt, table salt, and sea salt. Canned and packaged gravies. Worcestershire sauce. Tartar sauce. Barbecue sauce. Teriyaki sauce. Soy sauce, including reduced sodium. Steak sauce. Fish sauce. Oyster sauce. Cocktail sauce. Horseradish. Regular ketchup and mustard. Meat flavorings and tenderizers. Bouillon cubes. Hot sauce. Tabasco sauce. Marinades. Taco seasonings. Relishes. Fats and Oils Regular salad dressings. Salted butter. Margarine. Ghee. Bacon fat.  Other Potato and tortilla chips. Corn chips and puffs. Salted popcorn and pretzels. Canned or dried soups. Pizza. Frozen entrees and pot pies.  The items listed above may not be a complete list of foods and beverages to avoid. Contact your dietitian for more information. Document Released: 07/02/2001 Document Revised: 01/15/2013 Document Reviewed: 11/14/2012 Exit[employer] Patient Information 2015 [employer]. This information is not intended to replace advice given to you by your health care Mccrae Speciale. Make sure you discuss any questions you have with your health care Barry Culverhouse.  

## 2014-09-26 NOTE — Progress Notes (Signed)
Subjective:    Patient ID: Terrance Hawkins, male    DOB: 05/09/1990, 24 y.o.   MRN: [id number]  Congestive Heart Failure Presents for initial visit. The disease course has been stable. Associated symptoms include edema (around knees), fatigue, paroxysmal nocturnal dyspnea and shortness of breath. Pertinent negatives include no abdominal pain, chest pain, chest pressure or palpitations. The symptoms have been stable. Past treatments include ACE inhibitors, beta blockers and salt and fluid restriction. The treatment provided moderate relief. Compliance with prior treatments has been variable. Prior compliance problems include medication issues ("forget to sometimes take the 2nd doses"). Compliance with total regimen is 76-100%. Compliance problems include adherence to diet.   Hypertension This is a chronic problem. The current episode started more than 1 year ago. The problem has been gradually improving since onset. The problem is uncontrolled. Associated symptoms include malaise/fatigue and shortness of breath. Pertinent negatives include no anxiety, chest pain, headaches, palpitations or peripheral edema. Agents associated with hypertension include NSAIDs. Risk factors for coronary artery disease include male gender, obesity and smoking/tobacco exposure. Past treatments include ACE inhibitors, alpha 1 blockers, beta blockers and diuretics. The current treatment provides moderate improvement. Compliance problems: sometimes forgets the 2nd doses.  Hypertensive end-organ damage includes heart failure. There is no history of kidney disease or CVA.  Other This is a chronic (bilateral knee pain) problem. The current episode started more than 1 year ago. The problem occurs constantly. The problem has been gradually worsening. Associated symptoms include arthralgias (both knees and hips), coughing, fatigue and joint swelling. Pertinent negatives include no abdominal pain, chest pain, congestion, headaches or  sore throat. The symptoms are aggravated by walking, standing and exertion. He has tried position changes for the symptoms. The treatment provided mild relief.   Past Medical History  Diagnosis Date  . Hypertension     Past Surgical History  Procedure Laterality Date  . Thyroid surgery      Family History  Problem Relation Age of Onset  . Diabetes Mother   . Kidney failure Mother   . Hypertension Father   . Diabetes Maternal Grandmother   . Breast cancer Paternal Grandmother     Social History  Substance Use Topics  . Smoking status: Current Every Day Smoker -- 1.50 packs/day  . Smokeless tobacco: Never Used  . Alcohol Use: 0.0 oz/week    0 Standard drinks or equivalent per week     Comment: occassional    No Known Allergies  Prior to Admission medications   Medication Sig Start Date End Date Taking? Authorizing Provider  carvedilol (COREG) 25 MG tablet Take 1 tablet (25 mg total) by mouth 2 (two) times daily with a meal. 09/09/14  Yes Katharina Caper, MD  cloNIDine (CATAPRES) 0.1 MG tablet Take 0.1 mg by mouth 2 (two) times daily.   Yes Historical Provider, MD  furosemide (LASIX) 20 MG tablet Take 1 tablet (20 mg total) by mouth daily. Patient taking differently: Take 40 mg by mouth daily.  08/12/14 08/12/15 Yes Emily Filbert, MD  lisinopril (PRINIVIL,ZESTRIL) 20 MG tablet Take 1 tablet (20 mg total) by mouth daily. 08/09/14 08/09/15 Yes Jene Every, MD  nicotine (NICODERM CQ - DOSED IN MG/24 HOURS) 21 mg/24hr patch Place 1 patch (21 mg total) onto the skin daily. 09/09/14  Yes Katharina Caper, MD  traMADol (ULTRAM) 50 MG tablet Take 1 tablet (50 mg total) by mouth every 6 (six) hours as needed. 08/09/14 08/09/15 Yes Jene Every, MD  Review of Systems  Constitutional: Positive for malaise/fatigue, appetite change and fatigue.  HENT: Negative for congestion, postnasal drip and sore throat.   Eyes: Negative.   Respiratory: Positive for cough, shortness of breath and  wheezing (with hot days). Negative for chest tightness.   Cardiovascular: Negative for chest pain, palpitations and leg swelling.  Gastrointestinal: Negative for abdominal pain and abdominal distention.  Endocrine: Negative.   Genitourinary: Negative.   Musculoskeletal: Positive for joint swelling and arthralgias (both knees and hips).  Skin: Negative.   Allergic/Immunologic: Negative.   Neurological: Positive for dizziness (when bending over for long periods). Negative for light-headedness and headaches.  Hematological: Negative for adenopathy. Does not bruise/bleed easily.  Psychiatric/Behavioral: Positive for sleep disturbance (sleeping on 2 pillows) and dysphoric mood. The patient is not nervous/anxious.        Objective:   Physical Exam  Constitutional: He is oriented to person, place, and time. He appears well-developed and well-nourished.  HENT:  Head: Normocephalic and atraumatic.  Eyes: Conjunctivae are normal. Pupils are equal, round, and reactive to light.  Neck: Normal range of motion. Neck supple.  Cardiovascular: Normal rate and regular rhythm.   Pulmonary/Chest: Effort normal. He has no wheezes. He has no rales.  Abdominal: Soft. He exhibits no distension. There is no tenderness.  Musculoskeletal: He exhibits no edema.       Right knee: He exhibits swelling, deformity, abnormal alignment and bony tenderness.       Left knee: He exhibits decreased range of motion, swelling and deformity. Tenderness found.  Neurological: He is alert and oriented to person, place, and time.  Skin: Skin is warm and dry.  Psychiatric: His behavior is normal. His mood appears not anxious. He exhibits a depressed mood.  Nursing note and vitals reviewed.   BP 180/110 mmHg  Pulse 72  Resp 20  Ht 5\' 7"  (1.702 m)  Wt 292 lb (132.45 kg)  BMI 45.72 kg/m2  SpO2 98%       Assessment & Plan:  1: Chronic heart failure with preserved ejection fraction- Patient presents with fatigue upon  exertion and occasional shortness of breath upon exertion. He denies being short of breath upon walking into the office today. When he does get tired, he will stop and sit down until his energy level improves. He does not have scales to weigh on so a set of scales was given to him. He was instructed to weigh every day after using the bathroom and to call for an overnight weight gain of >2 pounds or a weekly weight gain of >5 pounds. Weight chart given to him so that he can write it down and bring back for review. He is not adding any salt to his food but does eat sandwiches. Discussed a 2000mg  sodium diet in detail and written information was given to him about how to read food labels. He did not bring his medications in with him and he was instructed to bring them to every visit. He does say that he sometimes forgets to take his 2nd doses during the day. Discussed ways to help him remember. 2: HTN- Blood pressure elevated even on recheck. Patient says that he did not take his medications yet this morning because he woke up late. He says that he checks it at home and the last time he checked it it was 159/79. Discussed the importance of taking the medications before coming to the office as well as remembering to take the 2nd daily doses of medications.  3: Sleep apnea- Patient admits to snoring as well as waking himself up with apneic episodes. Has never had a sleep study. Referral made to Freda Munro, MD for evaluation and sleep study. 4: Tobacco use- Patient is currently wearing a nicotine patch which he says helps. Has not used cocaine since 09/07/14 and occasionally smokes marijuana due to his depression and pain. Cessation of all substances was discussed for 5 minutes with him. 5: Knee pain- Patient has a physical deformity where both knees bow in causing pain especially with walking. Now feels like the joints are getting swollen and pain is beginning in his hips. He says he started having pain in 5th grade  but his legs were normal. By 8th grade, his legs were bowed in. Does not have a PCP so an appointment was made with [facility] in 3 weeks. Most likely needs an orthopedic referral. 6: Depression- Patient does admit that he feels depressed with all his current health issues, pain and his inability to do a lot of activities that his friends do. Denies suicidal thoughts. He will talk with his PCP in a few weeks about possible treatment of his depression.   Return in 2 weeks or sooner for any questions/problems before then.

## 2014-10-10 ENCOUNTER — Ambulatory Visit: Payer: PRIVATE HEALTH INSURANCE | Admitting: Family

## 2014-10-10 ENCOUNTER — Telehealth: Payer: Self-pay | Admitting: Family

## 2014-10-10 NOTE — Telephone Encounter (Signed)
Patient did not show up for [facility]. Will attempt to reschedule.

## 2014-10-17 ENCOUNTER — Ambulatory Visit: Payer: Self-pay | Admitting: Family Medicine

## 2015-04-05 ENCOUNTER — Emergency Department
Admission: EM | Admit: 2015-04-05 | Discharge: 2015-04-05 | Disposition: A | Discharge status: Home / Self Care | Payer: PRIVATE HEALTH INSURANCE | Attending: Emergency Medicine | Admitting: Emergency Medicine

## 2015-04-05 ENCOUNTER — Emergency Department: Payer: PRIVATE HEALTH INSURANCE

## 2015-04-05 DIAGNOSIS — I10 Essential (primary) hypertension: Secondary | ICD-10-CM | POA: Insufficient documentation

## 2015-04-05 DIAGNOSIS — F17200 Nicotine dependence, unspecified, uncomplicated: Secondary | ICD-10-CM | POA: Diagnosis not present

## 2015-04-05 DIAGNOSIS — R079 Chest pain, unspecified: Secondary | ICD-10-CM | POA: Insufficient documentation

## 2015-04-05 LAB — BASIC METABOLIC PANEL
ANION GAP: 7 (ref 5–15)
BUN: 8 mg/dL (ref 6–20)
CHLORIDE: 102 mmol/L (ref 101–111)
CO2: 27 mmol/L (ref 22–32)
Calcium: 8.8 mg/dL — ABNORMAL LOW (ref 8.9–10.3)
Creatinine, Ser: 0.83 mg/dL (ref 0.61–1.24)
GFR calc Af Amer: >60 mL/min (ref >60)
GLUCOSE: 111 mg/dL — AB (ref 65–99)
POTASSIUM: 3.3 mmol/L — AB (ref 3.5–5.1)
SODIUM: 136 mmol/L (ref 135–145)

## 2015-04-05 LAB — CBC
HEMATOCRIT: 48.8 % (ref 40.0–52.0)
HEMOGLOBIN: 16.5 g/dL (ref 13.0–18.0)
MCH: 28.8 pg (ref 26.0–34.0)
MCHC: 33.9 g/dL (ref 32.0–36.0)
MCV: 85 fL (ref 80.0–100.0)
Platelets: 201 10*3/uL (ref 150–440)
RBC: 5.74 MIL/uL (ref 4.40–5.90)
RDW: 14.1 % (ref 11.5–14.5)
WBC: 11.3 10*3/uL — AB (ref 3.8–10.6)

## 2015-04-05 LAB — TROPONIN I: TROPONIN I: 0.03 ng/mL (ref <0.031)

## 2015-04-05 NOTE — ED Notes (Signed)
Pt not in lobby when called

## 2015-04-05 NOTE — ED Notes (Signed)
Pt states here for chest pain that started a couple hours ago, states that 3 days ago he had similar sensation and thought it was gas, so today he became concerned about the recurrent pain. Pt points to the left side of his chest when asked where his pain is, pt states that "its a real tight feeling"

## 2015-08-11 ENCOUNTER — Emergency Department
Admission: EM | Admit: 2015-08-11 | Discharge: 2015-08-11 | Disposition: A | Discharge status: Home / Self Care | Payer: PRIVATE HEALTH INSURANCE | Attending: Emergency Medicine | Admitting: Emergency Medicine

## 2015-08-11 ENCOUNTER — Encounter: Payer: Self-pay | Admitting: Emergency Medicine

## 2015-08-11 ENCOUNTER — Emergency Department: Payer: PRIVATE HEALTH INSURANCE

## 2015-08-11 DIAGNOSIS — Z79899 Other long term (current) drug therapy: Secondary | ICD-10-CM | POA: Insufficient documentation

## 2015-08-11 DIAGNOSIS — M7918 Myalgia, other site: Secondary | ICD-10-CM

## 2015-08-11 DIAGNOSIS — I10 Essential (primary) hypertension: Secondary | ICD-10-CM | POA: Insufficient documentation

## 2015-08-11 DIAGNOSIS — M791 Myalgia: Secondary | ICD-10-CM | POA: Insufficient documentation

## 2015-08-11 DIAGNOSIS — M1710 Unilateral primary osteoarthritis, unspecified knee: Secondary | ICD-10-CM

## 2015-08-11 DIAGNOSIS — G8929 Other chronic pain: Secondary | ICD-10-CM

## 2015-08-11 DIAGNOSIS — M1990 Unspecified osteoarthritis, unspecified site: Secondary | ICD-10-CM | POA: Insufficient documentation

## 2015-08-11 DIAGNOSIS — F17200 Nicotine dependence, unspecified, uncomplicated: Secondary | ICD-10-CM | POA: Insufficient documentation

## 2015-08-11 LAB — BASIC METABOLIC PANEL
ANION GAP: 6 (ref 5–15)
BUN: 10 mg/dL (ref 6–20)
CALCIUM: 9.1 mg/dL (ref 8.9–10.3)
CO2: 27 mmol/L (ref 22–32)
Chloride: 104 mmol/L (ref 101–111)
Creatinine, Ser: 0.72 mg/dL (ref 0.61–1.24)
GFR calc Af Amer: >60 mL/min (ref >60)
GLUCOSE: 94 mg/dL (ref 65–99)
POTASSIUM: 3.7 mmol/L (ref 3.5–5.1)
SODIUM: 137 mmol/L (ref 135–145)

## 2015-08-11 LAB — TROPONIN I

## 2015-08-11 LAB — CBC
HCT: 48.6 % (ref 40.0–52.0)
HEMOGLOBIN: 16.5 g/dL (ref 13.0–18.0)
MCH: 29.3 pg (ref 26.0–34.0)
MCHC: 34 g/dL (ref 32.0–36.0)
MCV: 86.3 fL (ref 80.0–100.0)
Platelets: 205 10*3/uL (ref 150–440)
RBC: 5.63 MIL/uL (ref 4.40–5.90)
RDW: 14.3 % (ref 11.5–14.5)
WBC: 11.1 10*3/uL — AB (ref 3.8–10.6)

## 2015-08-11 MED ORDER — METHOCARBAMOL 500 MG PO TABS: 500.0000 mg | ORAL_TABLET | Freq: Four times a day (QID) | ORAL | Status: DC | PRN

## 2015-08-11 MED ORDER — AMLODIPINE BESYLATE 5 MG PO TABS: 5.0000 mg | ORAL_TABLET | Freq: Every day | ORAL | Status: DC

## 2015-08-11 MED ORDER — KETOROLAC TROMETHAMINE 30 MG/ML IJ SOLN
15.0000 mg | INTRAMUSCULAR | Status: AC
  Administered 2015-08-11: 15 mg via INTRAVENOUS
  Filled 2015-08-11: qty 1

## 2015-08-11 MED ORDER — NAPROXEN 500 MG PO TABS: 500.0000 mg | ORAL_TABLET | Freq: Two times a day (BID) | ORAL | Status: DC

## 2015-08-11 MED ORDER — METHOCARBAMOL 500 MG PO TABS
500.0000 mg | ORAL_TABLET | Freq: Once | ORAL | Status: AC
  Administered 2015-08-11: 500 mg via ORAL
  Filled 2015-08-11 (×2): qty 1

## 2015-08-11 NOTE — ED Notes (Signed)
Pt reports frontal headache x2 days, reports he doesn't take his lisinopril because he doesn't like the way it makes him feel. Pt reports he doesn't have a PCP, he comes here for his care. Pt also with c/o bilateral leg pain "for years".

## 2015-08-11 NOTE — Discharge Instructions (Signed)
Arthritis Arthritis is a term that is commonly used to refer to joint pain or joint disease. There are more than 100 types of arthritis. CAUSES The most common cause of this condition is wear and tear of a joint. Other causes include:  Gout.  Inflammation of a joint.  An infection of a joint.  Sprains and other injuries near the joint.  A drug reaction or allergic reaction. In some cases, the cause may not be known. SYMPTOMS The main symptom of this condition is pain in the joint with movement. Other symptoms include:  Redness, swelling, or stiffness at a joint.  Warmth coming from the joint.  Fever.  Overall feeling of illness. DIAGNOSIS This condition may be diagnosed with a physical exam and tests, including:  Blood tests.  Urine tests.  Imaging tests, such as MRI, X-rays, or a CT scan. Sometimes, fluid is removed from a joint for testing. TREATMENT Treatment for this condition may involve:  Treatment of the cause, if it is known.  Rest.  Raising (elevating) the joint.  Applying cold or hot packs to the joint.  Medicines to improve symptoms and reduce inflammation.  Injections of a steroid such as cortisone into the joint to help reduce pain and inflammation. Depending on the cause of your arthritis, you may need to make lifestyle changes to reduce stress on your joint. These changes may include exercising more and losing weight. HOME CARE INSTRUCTIONS Medicines  Take over-the-counter and prescription medicines only as told by your health care provider.  Do not take aspirin to relieve pain if gout is suspected. Activities  Rest your joint if told by your health care provider. Rest is important when your disease is active and your joint feels painful, swollen, or stiff.  Avoid activities that make the pain worse. It is important to balance activity with rest.  Exercise your joint regularly with range-of-motion exercises as told by your health care  provider. Try doing low-impact exercise, such as:  Swimming.  Water aerobics.  Biking.  Walking. Joint Care  If your joint is swollen, keep it elevated if told by your health care provider.  If your joint feels stiff in the morning, try taking a warm shower.  If directed, apply heat to the joint. If you have diabetes, do not apply heat without permission from your health care provider.  Put a towel between the joint and the hot pack or heating pad.  Leave the heat on the area for 20-30 minutes.  If directed, apply ice to the joint:  Put ice in a plastic bag.  Place a towel between your skin and the bag.  Leave the ice on for 20 minutes, 2-3 times per day.  Keep all follow-up visits as told by your health care provider. This is important. SEEK MEDICAL CARE IF:  The pain gets worse.  You have a fever. SEEK IMMEDIATE MEDICAL CARE IF:  You develop severe joint pain, swelling, or redness.  Many joints become painful and swollen.  You develop severe back pain.  You develop severe weakness in your leg.  You cannot control your bladder or bowels.   This information is not intended to replace advice given to you by your health care provider. Make sure you discuss any questions you have with your health care provider.   Document Released: 02/18/2004 Document Revised: 10/01/2014 Document Reviewed: 04/07/2014 Elsevier Interactive Patient Education 2016 [employer]  Chronic Pain Chronic pain can be defined as pain that is off and on  and lasts for 3-6 months or longer. Many things cause chronic pain, which can make it difficult to make a diagnosis. There are many treatment options available for chronic pain. However, finding a treatment that works well for you may require trying various approaches until the right one is found. Many people benefit from a combination of two or more types of treatment to control their pain. SYMPTOMS  Chronic pain can occur anywhere in the  body and can range from mild to very severe. Some types of chronic pain include:  Headache.  Low back pain.  Cancer pain.  Arthritis pain.  Neurogenic pain. This is pain resulting from damage to nerves. People with chronic pain may also have other symptoms such as:  Depression.  Anger.  Insomnia.  Anxiety. DIAGNOSIS  Your health care provider will help diagnose your condition over time. In many cases, the initial focus will be on excluding possible conditions that could be causing the pain. Depending on your symptoms, your health care provider may order tests to diagnose your condition. Some of these tests may include:   Blood tests.   CT scan.   MRI.   X-rays.   Ultrasounds.   Nerve conduction studies.  You may need to see a specialist.  TREATMENT  Finding treatment that works well may take time. You may be referred to a pain specialist. He or she may prescribe medicine or therapies, such as:   Mindful meditation or yoga.  Shots (injections) of numbing or pain-relieving medicines into the spine or area of pain.  Local electrical stimulation.  Acupuncture.   Massage therapy.   Aroma, color, light, or sound therapy.   Biofeedback.   Working with a physical therapist to keep from getting stiff.   Regular, gentle exercise.   Cognitive or behavioral therapy.   Group support.  Sometimes, surgery may be recommended.  HOME CARE INSTRUCTIONS   Take all medicines as directed by your health care provider.   Lessen stress in your life by relaxing and doing things such as listening to calming music.   Exercise or be active as directed by your health care provider.   Eat a healthy diet and include things such as vegetables, fruits, fish, and lean meats in your diet.   Keep all follow-up appointments with your health care provider.   Attend a support group with others suffering from chronic pain. SEEK MEDICAL CARE IF:   Your pain gets  worse.   You develop a new pain that was not there before.   You cannot tolerate medicines given to you by your health care provider.   You have new symptoms since your last visit with your health care provider.  SEEK IMMEDIATE MEDICAL CARE IF:   You feel weak.   You have decreased sensation or numbness.   You lose control of bowel or bladder function.   Your pain suddenly gets much worse.   You develop shaking.  You develop chills.  You develop confusion.  You develop chest pain.  You develop shortness of breath.  MAKE SURE YOU:  Understand these instructions.  Will watch your condition.  Will get help right away if you are not doing well or get worse.   This information is not intended to replace advice given to you by your health care provider. Make sure you discuss any questions you have with your health care provider.   Document Released: 10/02/2001 Document Revised: 09/12/2012 Document Reviewed: 07/06/2012 Elsevier Interactive Patient Education [employer]  Hypertension Hypertension, commonly called high blood pressure, is when the force of blood pumping through your arteries is too strong. Your arteries are the blood vessels that carry blood from your heart throughout your body. A blood pressure reading consists of a higher number over a lower number, such as 110/72. The higher number (systolic) is the pressure inside your arteries when your heart pumps. The lower number (diastolic) is the pressure inside your arteries when your heart relaxes. Ideally you want your blood pressure below 120/80. Hypertension forces your heart to work harder to pump blood. Your arteries may become narrow or stiff. Having untreated or uncontrolled hypertension can cause heart attack, stroke, kidney disease, and other problems. RISK FACTORS Some risk factors for high blood pressure are controllable. Others are not.  Risk factors you cannot control include:   Race. You  may be at higher risk if you are African American.  Age. Risk increases with age.  Gender. Men are at higher risk than women before age 80 years. After age 61, women are at higher risk than men. Risk factors you can control include:  Not getting enough exercise or physical activity.  Being overweight.  Getting too much fat, sugar, calories, or salt in your diet.  Drinking too much alcohol. SIGNS AND SYMPTOMS Hypertension does not usually cause signs or symptoms. Extremely high blood pressure (hypertensive crisis) may cause headache, anxiety, shortness of breath, and nosebleed. DIAGNOSIS To check if you have hypertension, your health care provider will measure your blood pressure while you are seated, with your arm held at the level of your heart. It should be measured at least twice using the same arm. Certain conditions can cause a difference in blood pressure between your right and left arms. A blood pressure reading that is higher than normal on one occasion does not mean that you need treatment. If it is not clear whether you have high blood pressure, you may be asked to return on a different day to have your blood pressure checked again. Or, you may be asked to monitor your blood pressure at home for 1 or more weeks. TREATMENT Treating high blood pressure includes making lifestyle changes and possibly taking medicine. Living a healthy lifestyle can help lower high blood pressure. You may need to change some of your habits. Lifestyle changes may include:  Following the DASH diet. This diet is high in fruits, vegetables, and whole grains. It is low in salt, red meat, and added sugars.  Keep your sodium intake below 2,300 mg per day.  Getting at least 30-45 minutes of aerobic exercise at least 4 times per week.  Losing weight if necessary.  Not smoking.  Limiting alcoholic beverages.  Learning ways to reduce stress. Your health care provider may prescribe medicine if lifestyle  changes are not enough to get your blood pressure under control, and if one of the following is true:  You are 51-56 years of age and your systolic blood pressure is above 140.  You are 63 years of age or older, and your systolic blood pressure is above 150.  Your diastolic blood pressure is above 90.  You have diabetes, and your systolic blood pressure is over 140 or your diastolic blood pressure is over 90.  You have kidney disease and your blood pressure is above 140/90.  You have heart disease and your blood pressure is above 140/90. Your personal target blood pressure may vary depending on your medical conditions, your age, and other factors. HOME CARE  INSTRUCTIONS  Have your blood pressure rechecked as directed by your health care provider.   Take medicines only as directed by your health care provider. Follow the directions carefully. Blood pressure medicines must be taken as prescribed. The medicine does not work as well when you skip doses. Skipping doses also puts you at risk for problems.  Do not smoke.   Monitor your blood pressure at home as directed by your health care provider. SEEK MEDICAL CARE IF:   You think you are having a reaction to medicines taken.  You have recurrent headaches or feel dizzy.  You have swelling in your ankles.  You have trouble with your vision. SEEK IMMEDIATE MEDICAL CARE IF:  You develop a severe headache or confusion.  You have unusual weakness, numbness, or feel faint.  You have severe chest or abdominal pain.  You vomit repeatedly.  You have trouble breathing. MAKE SURE YOU:   Understand these instructions.  Will watch your condition.  Will get help right away if you are not doing well or get worse.   This information is not intended to replace advice given to you by your health care provider. Make sure you discuss any questions you have with your health care provider.   Document Released: 01/10/2005 Document  Revised: 05/27/2014 Document Reviewed: 11/02/2012 Elsevier Interactive Patient Education 2016 [employer] Inc[employer]  Obesity Obesity is defined as having too much total body fat and a body mass index (BMI) of 30 or more. BMI is an estimate of body fat and is calculated from your height and weight. BMI is typically calculated by your health care provider during regular wellness visits. Obesity happens when you consume more calories than you can burn by exercising or performing daily physical tasks. Prolonged obesity can cause major illnesses or emergencies, such as:  Stroke.  Heart disease.  Diabetes.  Cancer.  Arthritis.  High blood pressure (hypertension).  High cholesterol.  Sleep apnea.  Erectile dysfunction.  Infertility problems. CAUSES   Regularly eating unhealthy foods.  Physical inactivity.  Certain disorders, such as an underactive thyroid (hypothyroidism), Cushing's syndrome, and polycystic ovarian syndrome.  Certain medicines, such as steroids, some depression medicines, and antipsychotics.  Genetics.  Lack of sleep. DIAGNOSIS A health care provider can diagnose obesity after calculating your BMI. Obesity will be diagnosed if your BMI is 30 or higher. There are other methods of measuring obesity levels. Some other methods include measuring your skinfold thickness, your waist circumference, and comparing your hip circumference to your waist circumference. TREATMENT  A healthy treatment program includes some or all of the following:  Long-term dietary changes.  Exercise and physical activity.  Behavioral and lifestyle changes.  Medicine only under the supervision of your health care provider. Medicines may help, but only if they are used with diet and exercise programs. If your BMI is 40 or higher, your health care provider may recommend specialized surgery or programs to help with weight loss. An unhealthy treatment program includes:  Fasting.  Fad  diets.  Supplements and drugs. These choices do not succeed in long-term weight control. HOME CARE INSTRUCTIONS  Exercise and perform physical activity as directed by your health care provider. To increase physical activity, try the following:  Use stairs instead of elevators.  Park farther away from store entrances.  Garden, bike, or walk instead of watching television or using the computer.  Eat healthy, low-calorie foods and drinks on a regular basis. Eat more fruits and vegetables. Use low-calorie cookbooks or take healthy  cooking classes.  Limit fast food, sweets, and processed snack foods.  Eat smaller portions.  Keep a daily journal of everything you eat. There are many free websites to help you with this. It may be helpful to measure your foods so you can determine if you are eating the correct portion sizes.  Avoid drinking alcohol. Drink more water and drinks without calories.  Take vitamins and supplements only as recommended by your health care provider.  Weight-loss support groups, Government social research officer, counselors, and stress reduction education can also be very helpful. SEEK IMMEDIATE MEDICAL CARE IF:  You have chest pain or tightness.  You have trouble breathing or feel short of breath.  You have weakness or leg numbness.  You feel confused or have trouble talking.  You have sudden changes in your vision.   This information is not intended to replace advice given to you by your health care provider. Make sure you discuss any questions you have with your health care provider.   Document Released: 02/18/2004 Document Revised: 01/31/2014 Document Reviewed: 02/16/2011 Elsevier Interactive Patient Education [employer]

## 2015-08-11 NOTE — ED Notes (Signed)
Pt returned from Xray at this time  

## 2015-08-11 NOTE — ED Provider Notes (Signed)
[facility] Emergency Department Provider Note  ____________________________________________  Time seen: 3:30 PM  I have reviewed the triage vital signs and the nursing notes.   HISTORY  Chief Complaint Hypertension and Leg Pain    HPI Terrance Hawkins is a 25 y.o. male who complains of chronic right knee pain. He states he's had this for many years and been told that he has degenerative disease of his knee.Denies any recent injuries or falls. No sudden change. No chest pain or shortness of breath. He has had a headache last few days, and he took Tylenol for his knee pain and found that her resolved his headache. He is on multiple antihypertensives including clonidine and Lasix Coreg and lisinopril but he states he takes them only intermittently and does not take the lisinopril at all because it makes him feel bad. He has not been following up with any primary care doctors, has been referred to specialist but has not seen them due to the cost.     Past Medical History  Diagnosis Date  . Hypertension      Patient Active Problem List   Diagnosis Date Noted  . Depression 09/26/2014  . Chronic diastolic heart failure (HCC) 09/26/2014  . Essential hypertension, malignant 09/09/2014  . Morbid obesity (HCC) 09/09/2014  . Obstructive sleep apnea 09/09/2014  . Hypokalemia 09/09/2014  . Tobacco abuse 09/09/2014  . Cocaine abuse 09/09/2014  . Marijuana abuse 09/09/2014  . Knee pain 09/09/2014  . CHF (congestive heart failure) (HCC) 09/07/2014     Past Surgical History  Procedure Laterality Date  . Thyroid surgery       Current Outpatient Rx  Name  Route  Sig  Dispense  Refill  . carvedilol (COREG) 25 MG tablet   Oral   Take 1 tablet (25 mg total) by mouth 2 (two) times daily with a meal. Patient taking differently: Take 25 mg by mouth daily.    60 tablet   2   . cloNIDine (CATAPRES) 0.1 MG tablet   Oral   Take 0.1 mg by mouth daily.           . furosemide (LASIX) 40 MG tablet   Oral   Take 40 mg by mouth daily.         Marland Kitchen. amLODipine (NORVASC) 5 MG tablet   Oral   Take 1 tablet (5 mg total) by mouth daily.   30 tablet   1   . methocarbamol (ROBAXIN) 500 MG tablet   Oral   Take 1 tablet (500 mg total) by mouth every 6 (six) hours as needed for muscle spasms.   20 tablet   0   . naproxen (NAPROSYN) 500 MG tablet   Oral   Take 1 tablet (500 mg total) by mouth 2 (two) times daily with a meal.   20 tablet   0      Allergies Review of patient's allergies indicates no known allergies.   Family History  Problem Relation Age of Onset  . Diabetes Mother   . Kidney failure Mother   . Hypertension Father   . Diabetes Maternal Grandmother   . Breast cancer Paternal Grandmother     Social History Social History  Substance Use Topics  . Smoking status: Current Every Day Smoker -- 1.50 packs/day  . Smokeless tobacco: Never Used  . Alcohol Use: 0.0 oz/week    0 Standard drinks or equivalent per week     Comment: occassional    Review of  Systems  Constitutional:   No fever or chills.  ENT:   No sore throat. No rhinorrhea. Cardiovascular:   No chest pain. Respiratory:   No dyspnea or cough. Gastrointestinal:   Negative for abdominal pain, vomiting and diarrhea.  Genitourinary:   Negative for dysuria or difficulty urinating. Musculoskeletal:   Chronic bilateral knee pain Neurological:   Bilateral frontal headache 2 days, now resolved 10-point ROS otherwise negative.  ____________________________________________   PHYSICAL EXAM:  VITAL SIGNS: ED Triage Vitals  Enc Vitals Group     BP 08/11/15 1446 198/121 mmHg     Pulse Rate 08/11/15 1444 85     Resp 08/11/15 1530 31     Temp 08/11/15 1444 98.1 F (36.7 C)     Temp Source 08/11/15 1444 Oral     SpO2 08/11/15 1444 99 %     Weight 08/11/15 1444 300 lb (136.079 kg)     Height 08/11/15 1444 5\' 7"  (1.702 m)     Head Cir --      Peak Flow --       Pain Score 08/11/15 1455 10     Pain Loc --      Pain Edu? --      Excl. in GC? --     Vital signs reviewed, nursing assessments reviewed.   Constitutional:   Alert and oriented. Well appearing and in no distress. Eyes:   No scleral icterus. No conjunctival pallor. PERRL. EOMI.  No nystagmus. ENT   Head:   Normocephalic and atraumatic.   Nose:   No congestion/rhinnorhea. No septal hematoma   Mouth/Throat:   MMM, no pharyngeal erythema. No peritonsillar mass.    Neck:   No stridor. No SubQ emphysema. No meningismus. Hematological/Lymphatic/Immunilogical:   No cervical lymphadenopathy. Cardiovascular:   RRR. Symmetric bilateral radial and DP pulses.  No murmurs.  Respiratory:   Normal respiratory effort without tachypnea nor retractions. Breath sounds are clear and equal bilaterally. No wheezes/rales/rhonchi. Gastrointestinal:   Soft and nontender. Non distended. There is no CVA tenderness.  No rebound, rigidity, or guarding. Genitourinary:   deferred Musculoskeletal:   Nontender with normal range of motion in all extremities. No joint effusions.  No lower extremity tenderness.  No edema. There is symmetric valgus deformity of bilateral knees Neurologic:   Normal speech and language.  CN 2-10 normal. Motor grossly intact. No gross focal neurologic deficits are appreciated.  Skin:    Skin is warm, dry and intact. No rash noted.  No petechiae, purpura, or bullae.  ____________________________________________    LABS (pertinent positives/negatives) (all labs ordered are listed, but only abnormal results are displayed) Labs Reviewed  CBC - Abnormal; Notable for the following:    WBC 11.1 (*)    All other components within normal limits  BASIC METABOLIC PANEL  TROPONIN I   ____________________________________________   EKG  Interpreted by me Normal sinus rhythm rate of 77, normal axis and intervals. Poor R-wave progression in anterior precordial leads. Voltage  criteria for LVH. No acute ischemic changes.  ____________________________________________    RADIOLOGY  Chest x-ray consistent with cardiomegaly but no other evidence of heart failure. No pulmonary edema.  ____________________________________________   PROCEDURES   ____________________________________________   INITIAL IMPRESSION / ASSESSMENT AND PLAN / ED COURSE  Pertinent labs & imaging results that were available during my care of the patient were reviewed by me and considered in my medical decision making (see chart for details).  Patient well appearing no acute distress, complains of chronic musculoskeletal  pain the exam is unremarkable. No evidence of DVT or soft tissue infection or fracture or dislocation. He is neurovascularly intact. Patient is requesting pain medicine for his chronic pain, and I advised him that due to the nature of this pain that has been going on for a couple of years and is likely to go on for many more years if not the rest of his life, opioids are very poor choice for pain control and we will need to find other options including weight loss NSAIDs and exercise therapy. I'll have him follow-up with his assigned primary care doctor. Since he takes the lisinopril very sporadically, I advised him to stop taking it and I'll replace it with amlodipine while he follows up. I encouraged him to take all his medications as prescribed. Counseled him on the effects of chronically elevated and uncontrolled hypertension including increased risk of heart attack strokes and kidney failure.     ____________________________________________   FINAL CLINICAL IMPRESSION(S) / ED DIAGNOSES  Final diagnoses:  Morbid obesity, unspecified obesity type (HCC)  Tricompartment degenerative joint disease of knee  Chronic musculoskeletal pain  Essential hypertension       Portions of this note were generated with dragon dictation software. Dictation errors may occur  despite best attempts at proofreading.   Sharman Cheek, MD 08/11/15 1622

## 2016-01-05 ENCOUNTER — Encounter: Payer: Self-pay | Admitting: Emergency Medicine

## 2016-01-05 ENCOUNTER — Emergency Department: Payer: PRIVATE HEALTH INSURANCE

## 2016-01-05 DIAGNOSIS — I509 Heart failure, unspecified: Secondary | ICD-10-CM | POA: Insufficient documentation

## 2016-01-05 DIAGNOSIS — I110 Hypertensive heart disease with heart failure: Secondary | ICD-10-CM | POA: Insufficient documentation

## 2016-01-05 DIAGNOSIS — F17200 Nicotine dependence, unspecified, uncomplicated: Secondary | ICD-10-CM | POA: Diagnosis not present

## 2016-01-05 DIAGNOSIS — Z5321 Procedure and treatment not carried out due to patient leaving prior to being seen by health care provider: Secondary | ICD-10-CM | POA: Insufficient documentation

## 2016-01-05 DIAGNOSIS — R51 Headache: Secondary | ICD-10-CM | POA: Insufficient documentation

## 2016-01-05 LAB — BASIC METABOLIC PANEL
ANION GAP: 6 (ref 5–15)
BUN: 11 mg/dL (ref 6–20)
CALCIUM: 9.1 mg/dL (ref 8.9–10.3)
CHLORIDE: 106 mmol/L (ref 101–111)
CO2: 26 mmol/L (ref 22–32)
Creatinine, Ser: 0.9 mg/dL (ref 0.61–1.24)
GFR calc non Af Amer: >60 mL/min (ref >60)
Glucose, Bld: 123 mg/dL — ABNORMAL HIGH (ref 65–99)
POTASSIUM: 3.5 mmol/L (ref 3.5–5.1)
Sodium: 138 mmol/L (ref 135–145)

## 2016-01-05 LAB — CBC
HEMATOCRIT: 47 % (ref 40.0–52.0)
HEMOGLOBIN: 16.1 g/dL (ref 13.0–18.0)
MCH: 29.2 pg (ref 26.0–34.0)
MCHC: 34.2 g/dL (ref 32.0–36.0)
MCV: 85.4 fL (ref 80.0–100.0)
Platelets: 208 10*3/uL (ref 150–440)
RBC: 5.51 MIL/uL (ref 4.40–5.90)
RDW: 13.3 % (ref 11.5–14.5)
WBC: 10.2 10*3/uL (ref 3.8–10.6)

## 2016-01-05 LAB — TROPONIN I

## 2016-01-05 NOTE — ED Triage Notes (Signed)
Pt presents to ED with c/o headache and left hand numbness x 5-6 days. Pt hx of CHF and HTN. Pt reports has been out of blood pressure medicine and "fluid pills" for the past 3 months due to insurance. Pt denies chest pain, vision problems, or abd pain. Pt alert and oriented x 4, speaking in complete sentences.

## 2016-01-06 ENCOUNTER — Emergency Department
Admission: EM | Admit: 2016-01-06 | Discharge: 2016-01-06 | Discharge status: Against Medical Advice | Payer: PRIVATE HEALTH INSURANCE | Attending: Emergency Medicine | Admitting: Emergency Medicine

## 2016-01-06 HISTORY — DX: Heart failure, unspecified: I50.9

## 2016-09-02 IMAGING — CR DG CHEST 2V
2 series · 2 of 2 positions shown · non-contrast
Comparison: 02/02/2012

CLINICAL DATA: Shortness of breath.

EXAM:
CHEST  2 VIEW

[chest pa]
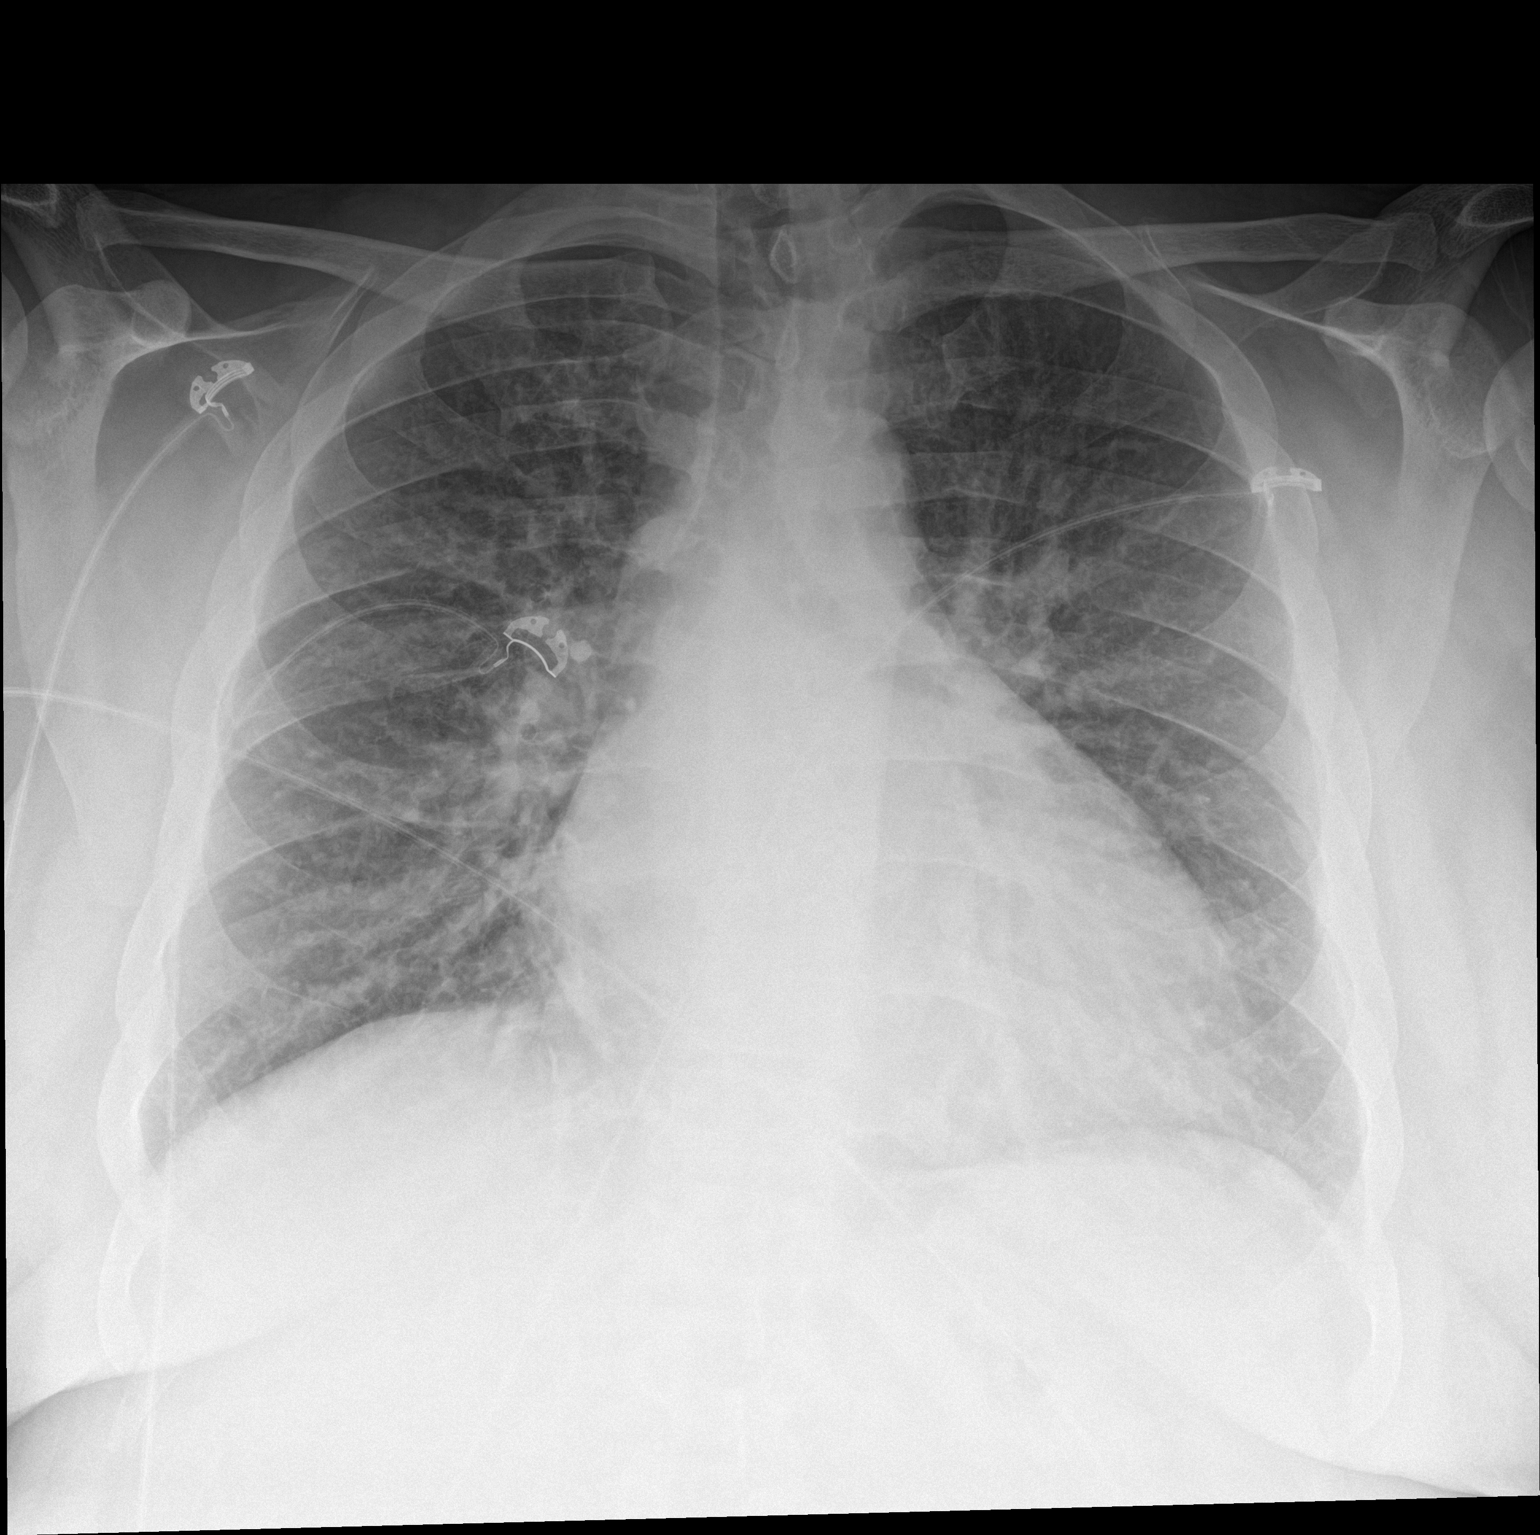

[chest lat]
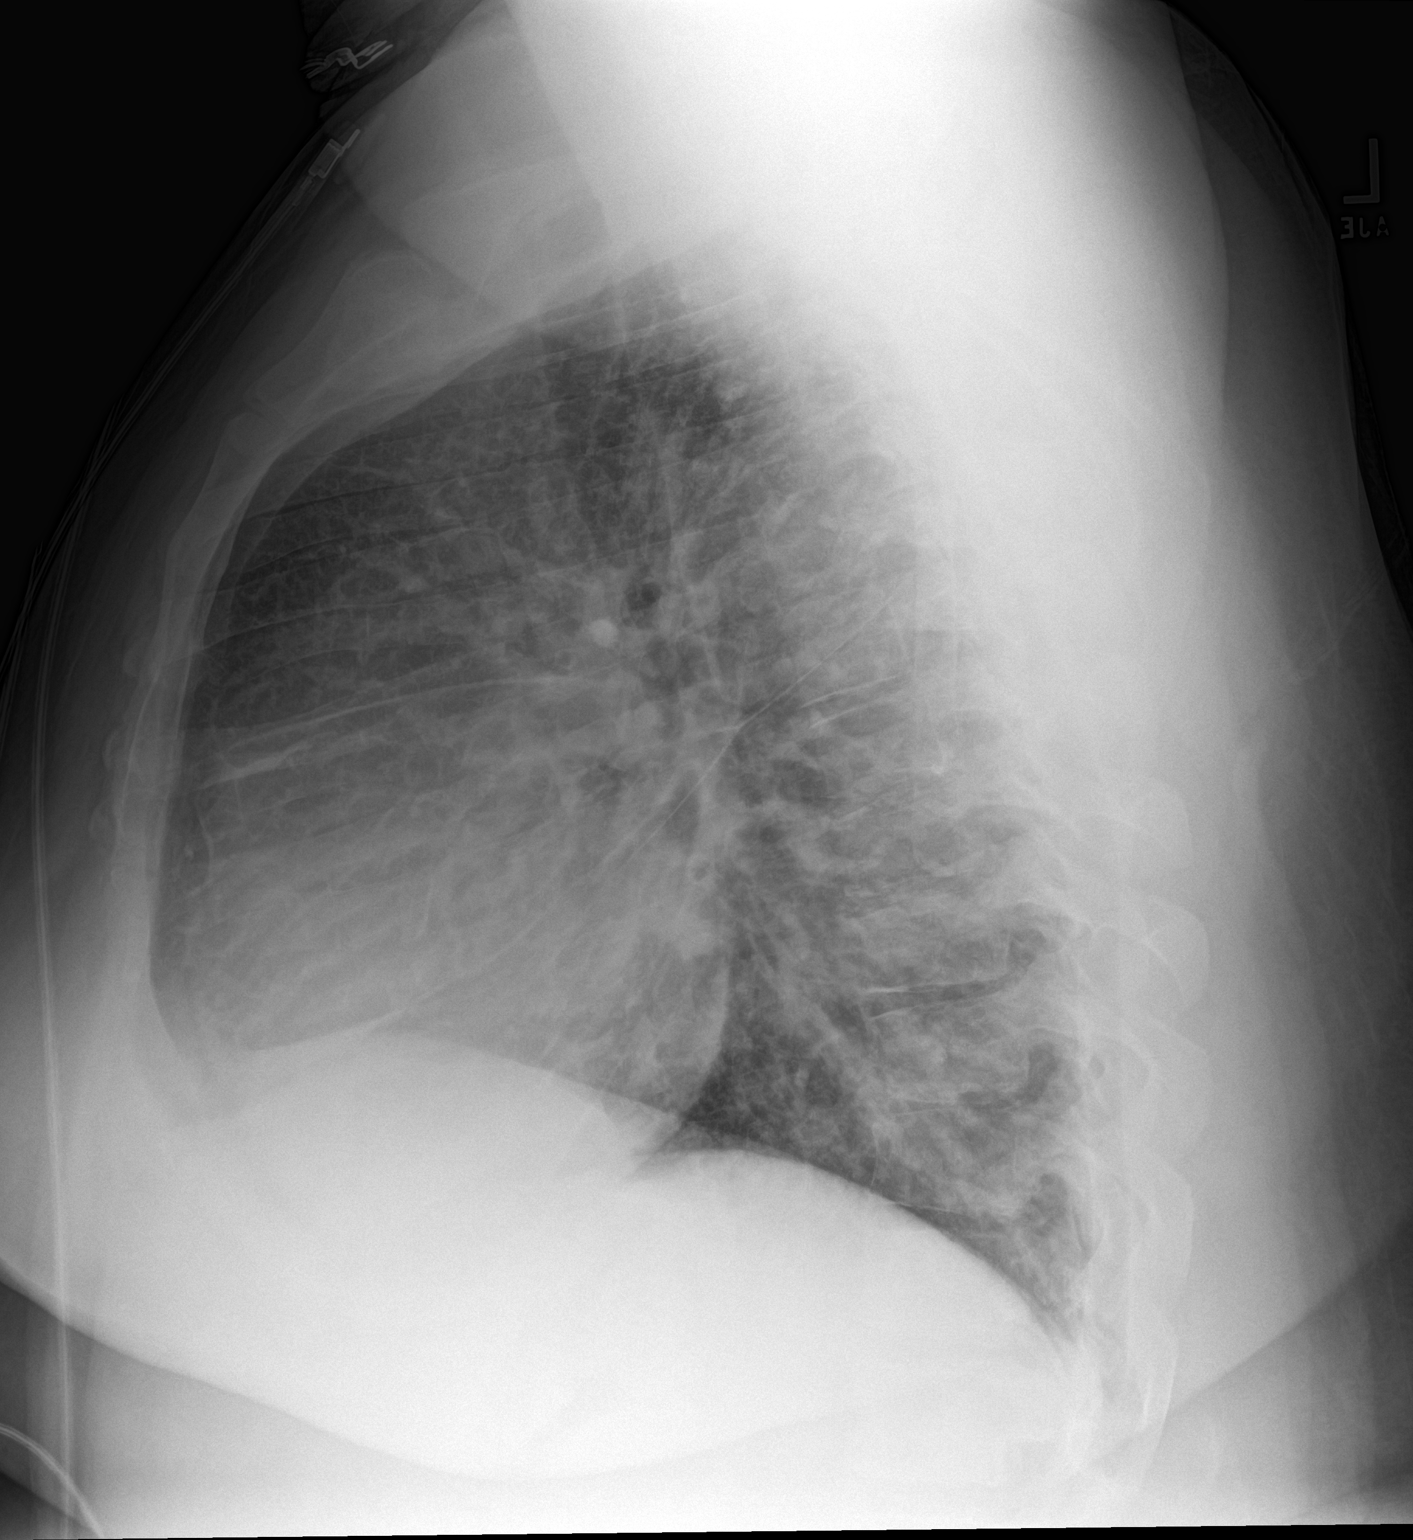

[2 of 2 positions shown; findings below may reference images not displayed]

FINDINGS: Moderate cardiac enlargement. Pulmonary vascular congestion is
noted. No pleural effusion or edema. No airspace consolidation
identified.
IMPRESSION: 1. Cardiac enlargement and pulmonary vascular congestion

## 2016-10-01 IMAGING — CR DG CHEST 2V
2 series · 2 of 2 positions shown · non-contrast
Comparison: Chest radiograph performed 08/09/2014, and CTA of the
chest performed 08/12/2014

CLINICAL DATA: Acute onset of left-sided chest pain, radiating to
the neck. Shortness of breath. Initial encounter.

EXAM:
CHEST  2 VIEW

[chest pa]
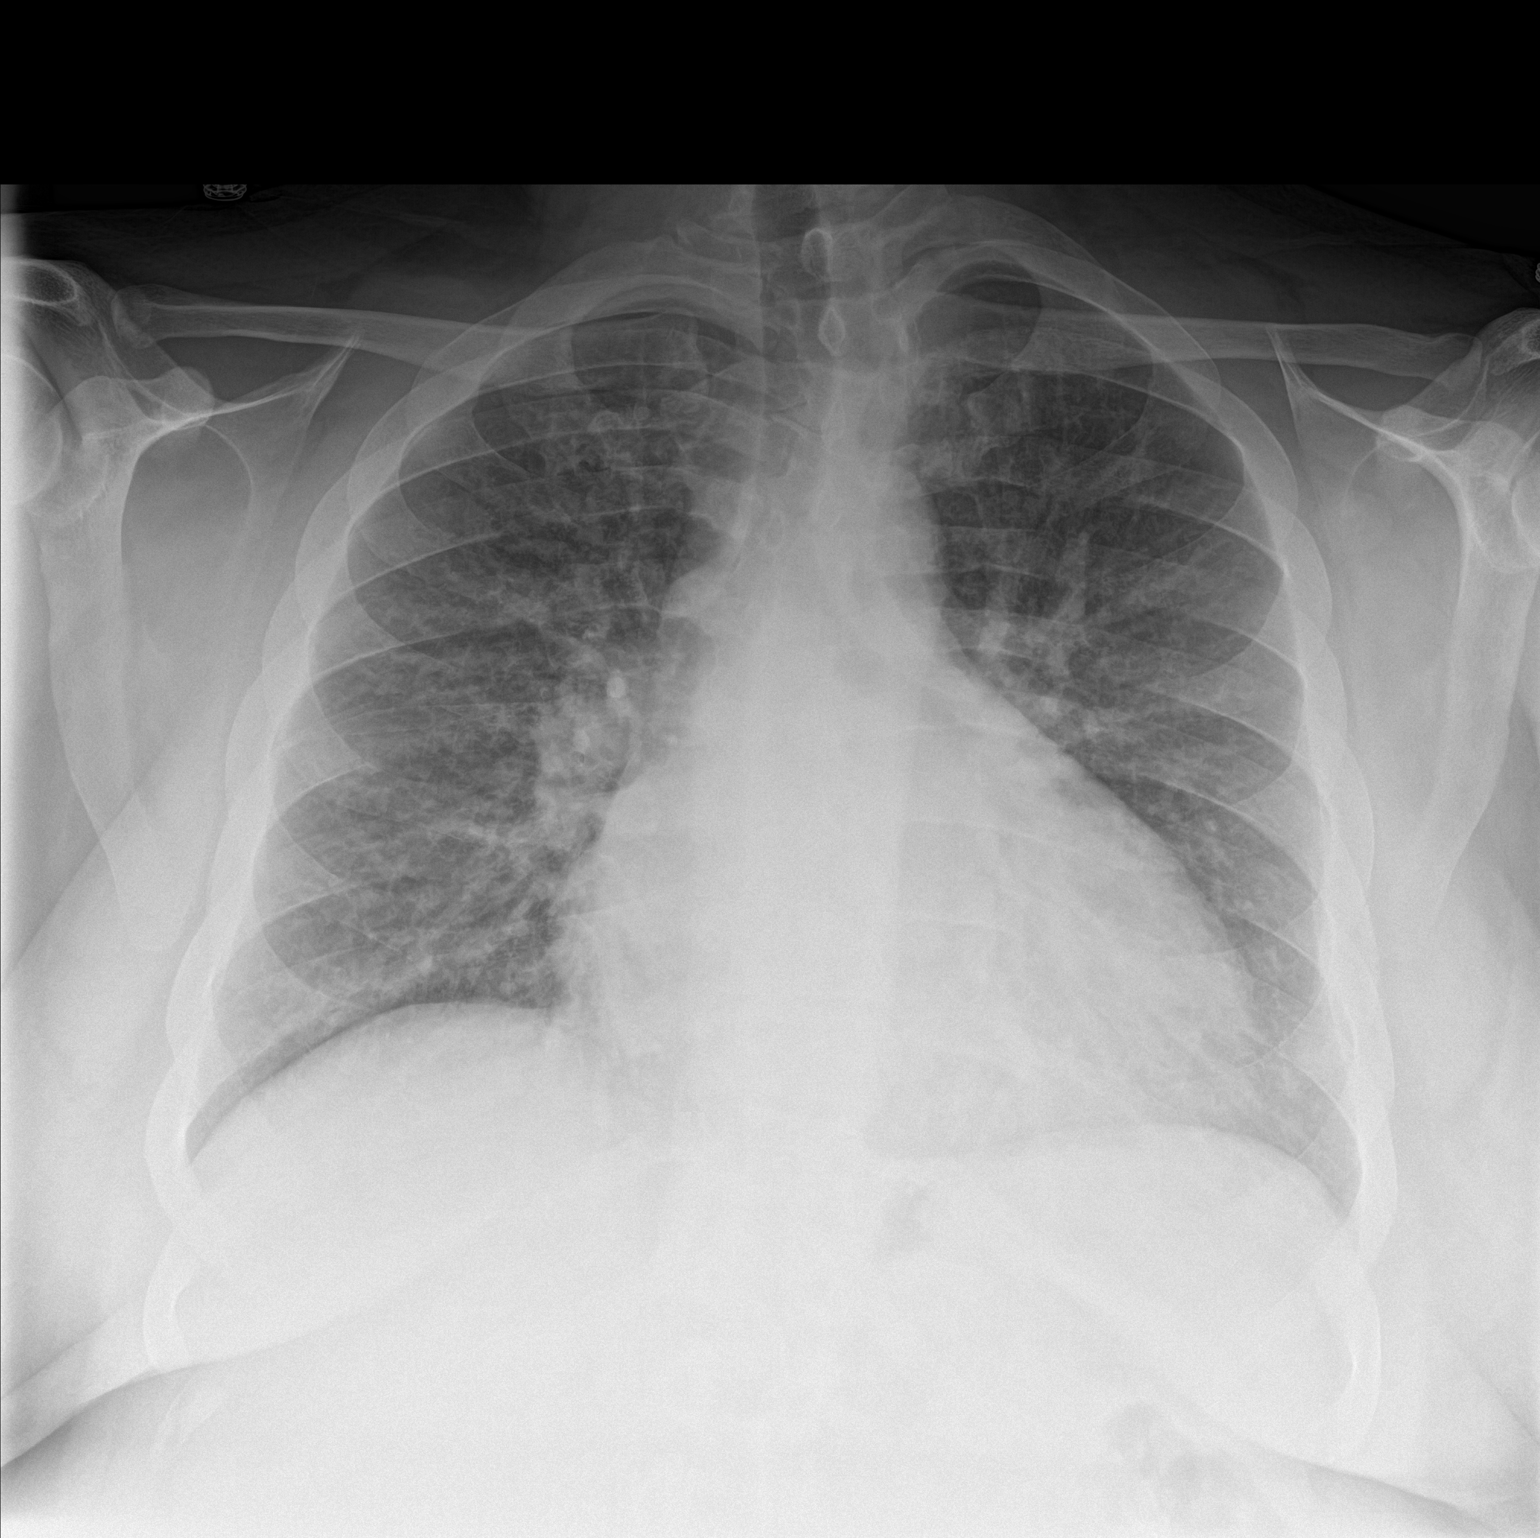

[chest lat]
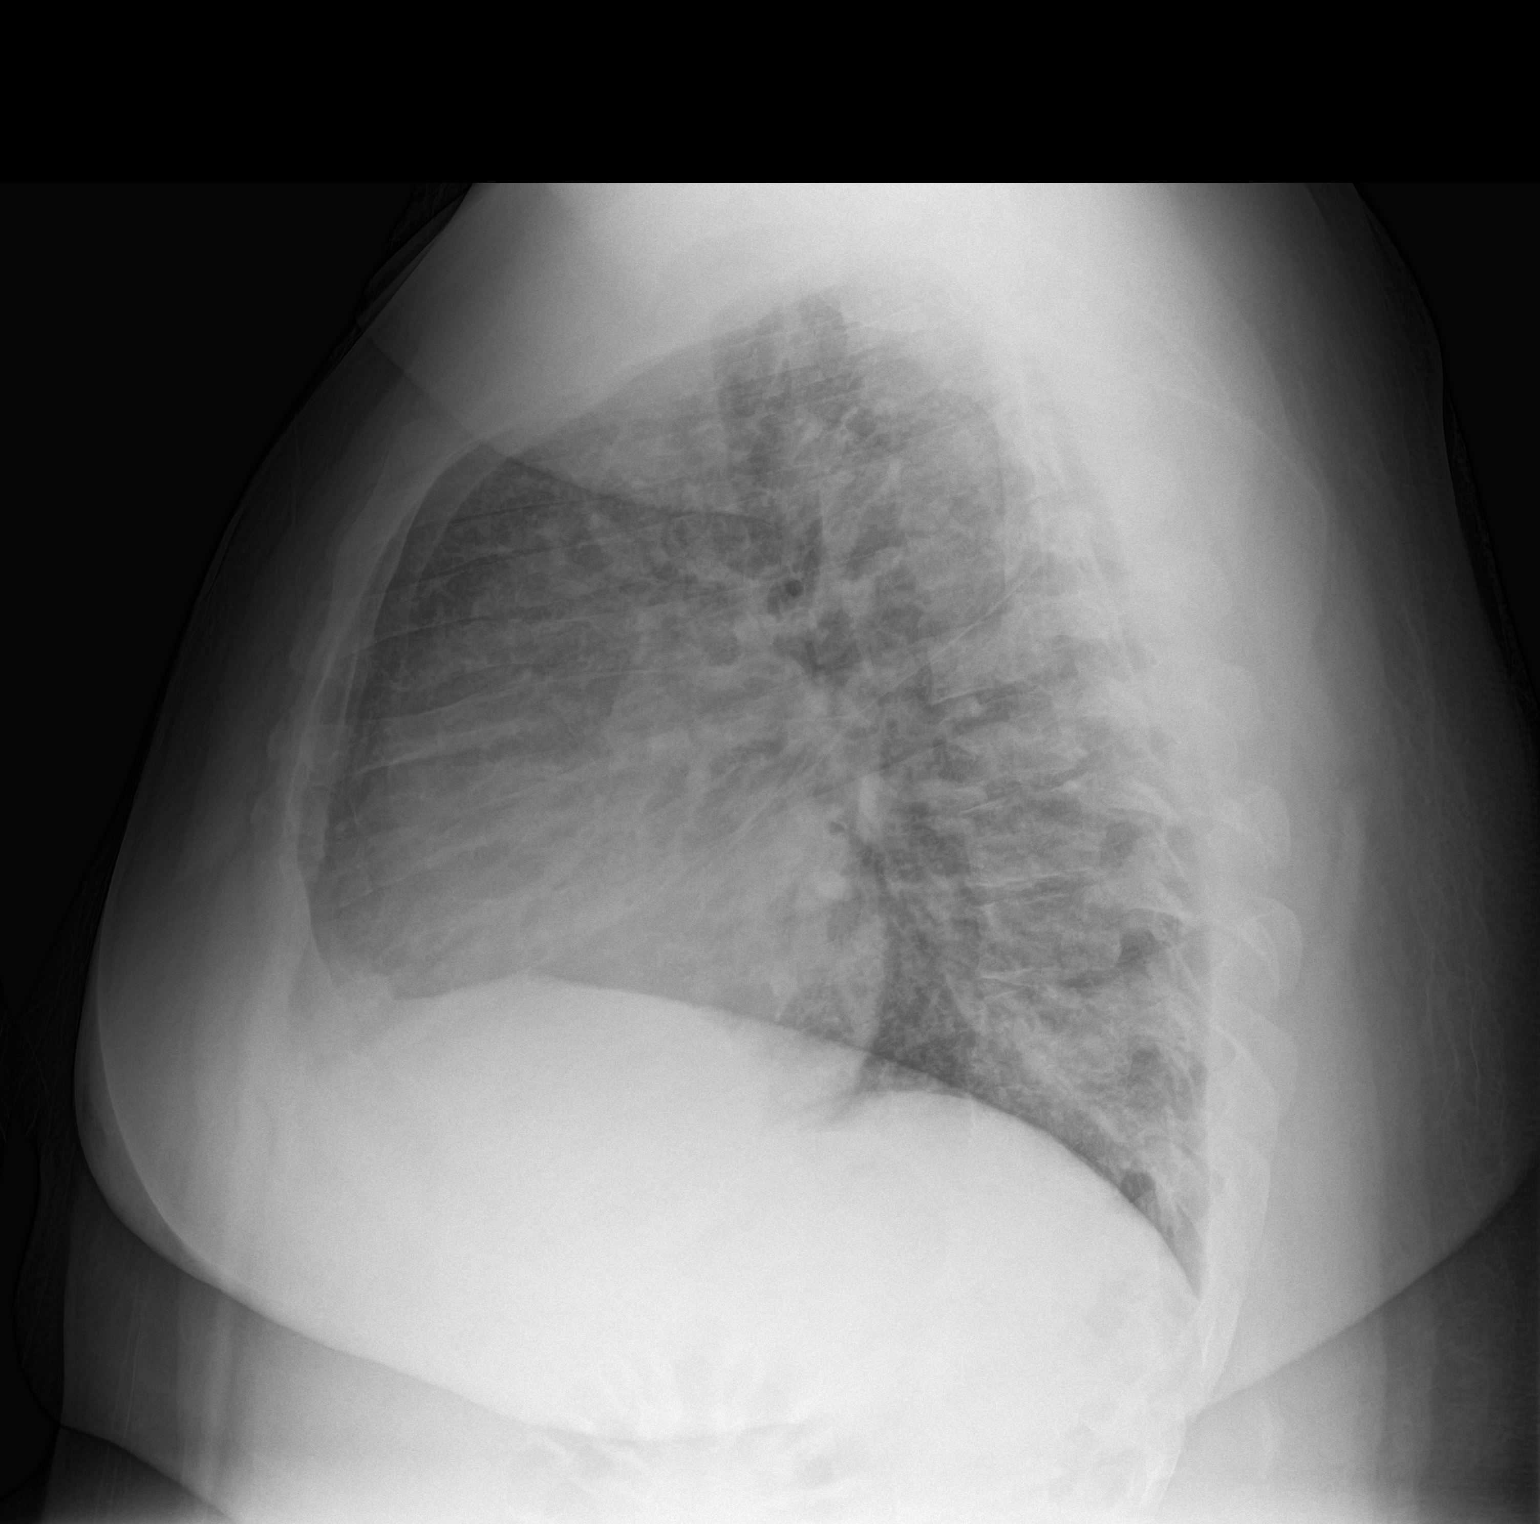

[2 of 2 positions shown; findings below may reference images not displayed]

FINDINGS: The lungs are well-aerated. Vascular congestion is noted. Increased
interstitial markings raise concern for mild interstitial edema.
There is no evidence of pleural effusion or pneumothorax.

The heart is mildly enlarged. No acute osseous abnormalities are
seen.
IMPRESSION: Vascular congestion and mild cardiomegaly. Increased interstitial
markings raise concern for mild interstitial edema.

## 2017-03-18 ENCOUNTER — Emergency Department: Payer: Medicaid Other

## 2017-03-18 ENCOUNTER — Other Ambulatory Visit: Payer: Self-pay

## 2017-03-18 DIAGNOSIS — I5033 Acute on chronic diastolic (congestive) heart failure: Secondary | ICD-10-CM | POA: Diagnosis present

## 2017-03-18 DIAGNOSIS — Z6841 Body Mass Index (BMI) 40.0 and over, adult: Secondary | ICD-10-CM

## 2017-03-18 DIAGNOSIS — Z91120 Patient's intentional underdosing of medication regimen due to financial hardship: Secondary | ICD-10-CM

## 2017-03-18 DIAGNOSIS — E669 Obesity, unspecified: Secondary | ICD-10-CM | POA: Diagnosis present

## 2017-03-18 DIAGNOSIS — I160 Hypertensive urgency: Principal | ICD-10-CM | POA: Diagnosis present

## 2017-03-18 DIAGNOSIS — G4733 Obstructive sleep apnea (adult) (pediatric): Secondary | ICD-10-CM | POA: Diagnosis present

## 2017-03-18 DIAGNOSIS — G8929 Other chronic pain: Secondary | ICD-10-CM | POA: Diagnosis present

## 2017-03-18 DIAGNOSIS — Z8249 Family history of ischemic heart disease and other diseases of the circulatory system: Secondary | ICD-10-CM

## 2017-03-18 DIAGNOSIS — Z833 Family history of diabetes mellitus: Secondary | ICD-10-CM

## 2017-03-18 DIAGNOSIS — Z841 Family history of disorders of kidney and ureter: Secondary | ICD-10-CM

## 2017-03-18 DIAGNOSIS — M25561 Pain in right knee: Secondary | ICD-10-CM | POA: Diagnosis present

## 2017-03-18 DIAGNOSIS — T461X6A Underdosing of calcium-channel blockers, initial encounter: Secondary | ICD-10-CM | POA: Diagnosis present

## 2017-03-18 DIAGNOSIS — I110 Hypertensive heart disease with heart failure: Secondary | ICD-10-CM | POA: Diagnosis present

## 2017-03-18 DIAGNOSIS — F17210 Nicotine dependence, cigarettes, uncomplicated: Secondary | ICD-10-CM | POA: Diagnosis present

## 2017-03-18 DIAGNOSIS — Z803 Family history of malignant neoplasm of breast: Secondary | ICD-10-CM

## 2017-03-18 NOTE — ED Notes (Signed)
Pt sitting in wheelchair in lobby, waiting patiently for treatment room; awake and alert; watching videos on cell phone; understands he will be taken to a treatment room as soon as possible; verbalized understanding

## 2017-03-18 NOTE — ED Triage Notes (Addendum)
Patient reports being short of breath for several weeks.  Patient is a smoker.  Reports cough and nasal congestion.  Patient is speaking in complete sentences no acute respiratory distress noted.

## 2017-03-19 ENCOUNTER — Inpatient Hospital Stay
Admission: EM | Admit: 2017-03-19 | Discharge: 2017-03-20 | DRG: 304 | Disposition: A | Discharge status: Home / Self Care | Payer: Medicaid Other | Attending: Internal Medicine | Admitting: Internal Medicine

## 2017-03-19 ENCOUNTER — Encounter: Payer: Self-pay | Admitting: *Deleted

## 2017-03-19 DIAGNOSIS — Z91120 Patient's intentional underdosing of medication regimen due to financial hardship: Secondary | ICD-10-CM | POA: Diagnosis not present

## 2017-03-19 DIAGNOSIS — F1210 Cannabis abuse, uncomplicated: Secondary | ICD-10-CM

## 2017-03-19 DIAGNOSIS — R0602 Shortness of breath: Secondary | ICD-10-CM | POA: Diagnosis not present

## 2017-03-19 DIAGNOSIS — I161 Hypertensive emergency: Secondary | ICD-10-CM | POA: Diagnosis not present

## 2017-03-19 DIAGNOSIS — G8929 Other chronic pain: Secondary | ICD-10-CM

## 2017-03-19 DIAGNOSIS — G4733 Obstructive sleep apnea (adult) (pediatric): Secondary | ICD-10-CM | POA: Diagnosis present

## 2017-03-19 DIAGNOSIS — I5033 Acute on chronic diastolic (congestive) heart failure: Secondary | ICD-10-CM | POA: Diagnosis present

## 2017-03-19 DIAGNOSIS — F17210 Nicotine dependence, cigarettes, uncomplicated: Secondary | ICD-10-CM | POA: Diagnosis present

## 2017-03-19 DIAGNOSIS — I160 Hypertensive urgency: Secondary | ICD-10-CM | POA: Diagnosis not present

## 2017-03-19 DIAGNOSIS — E669 Obesity, unspecified: Secondary | ICD-10-CM | POA: Diagnosis present

## 2017-03-19 DIAGNOSIS — I5023 Acute on chronic systolic (congestive) heart failure: Secondary | ICD-10-CM | POA: Diagnosis present

## 2017-03-19 DIAGNOSIS — M25561 Pain in right knee: Secondary | ICD-10-CM

## 2017-03-19 DIAGNOSIS — I110 Hypertensive heart disease with heart failure: Secondary | ICD-10-CM | POA: Diagnosis present

## 2017-03-19 DIAGNOSIS — Z8249 Family history of ischemic heart disease and other diseases of the circulatory system: Secondary | ICD-10-CM | POA: Diagnosis not present

## 2017-03-19 DIAGNOSIS — Z841 Family history of disorders of kidney and ureter: Secondary | ICD-10-CM | POA: Diagnosis not present

## 2017-03-19 DIAGNOSIS — Z803 Family history of malignant neoplasm of breast: Secondary | ICD-10-CM | POA: Diagnosis not present

## 2017-03-19 DIAGNOSIS — Z833 Family history of diabetes mellitus: Secondary | ICD-10-CM | POA: Diagnosis not present

## 2017-03-19 DIAGNOSIS — I509 Heart failure, unspecified: Secondary | ICD-10-CM

## 2017-03-19 DIAGNOSIS — T461X6A Underdosing of calcium-channel blockers, initial encounter: Secondary | ICD-10-CM | POA: Diagnosis present

## 2017-03-19 DIAGNOSIS — Z6841 Body Mass Index (BMI) 40.0 and over, adult: Secondary | ICD-10-CM | POA: Diagnosis not present

## 2017-03-19 LAB — CBC WITH DIFFERENTIAL/PLATELET
Basophils Absolute: 0.1 10*3/uL (ref 0–0.1)
Basophils Relative: 1 %
EOS ABS: 0.3 10*3/uL (ref 0–0.7)
Eosinophils Relative: 3 %
HCT: 45.1 % (ref 40.0–52.0)
Hemoglobin: 14.9 g/dL (ref 13.0–18.0)
Lymphocytes Relative: 25 %
Lymphs Abs: 2.6 10*3/uL (ref 1.0–3.6)
MCH: 28.7 pg (ref 26.0–34.0)
MCHC: 33.1 g/dL (ref 32.0–36.0)
MCV: 86.6 fL (ref 80.0–100.0)
MONO ABS: 1.1 10*3/uL — AB (ref 0.2–1.0)
MONOS PCT: 11 %
Neutro Abs: 6.2 10*3/uL (ref 1.4–6.5)
Neutrophils Relative %: 60 %
Platelets: 214 10*3/uL (ref 150–440)
RBC: 5.21 MIL/uL (ref 4.40–5.90)
RDW: 14.4 % (ref 11.5–14.5)
WBC: 10.2 10*3/uL (ref 3.8–10.6)

## 2017-03-19 LAB — BASIC METABOLIC PANEL
Anion gap: 7 (ref 5–15)
BUN: 20 mg/dL (ref 6–20)
CALCIUM: 8.6 mg/dL — AB (ref 8.9–10.3)
CO2: 25 mmol/L (ref 22–32)
CREATININE: 1.09 mg/dL (ref 0.61–1.24)
Chloride: 109 mmol/L (ref 101–111)
GFR calc Af Amer: >60 mL/min (ref >60)
GFR calc non Af Amer: >60 mL/min (ref >60)
Glucose, Bld: 104 mg/dL — ABNORMAL HIGH (ref 65–99)
Potassium: 3.5 mmol/L (ref 3.5–5.1)
SODIUM: 141 mmol/L (ref 135–145)

## 2017-03-19 LAB — TROPONIN I
TROPONIN I: 0.04 ng/mL — AB (ref <0.03)
Troponin I: <0.03 ng/mL (ref <0.03)
Troponin I: 0.06 ng/mL (ref <0.03)

## 2017-03-19 LAB — MRSA PCR SCREENING: MRSA by PCR: NEGATIVE

## 2017-03-19 LAB — URINE DRUG SCREEN, QUALITATIVE (ARMC ONLY)
Amphetamines, Ur Screen: NOT DETECTED
Barbiturates, Ur Screen: NOT DETECTED
Benzodiazepine, Ur Scrn: NOT DETECTED
CANNABINOID 50 NG, UR ~~LOC~~: POSITIVE — AB
Cocaine Metabolite,Ur ~~LOC~~: NOT DETECTED
MDMA (ECSTASY) UR SCREEN: NOT DETECTED
Methadone Scn, Ur: NOT DETECTED
OPIATE, UR SCREEN: NOT DETECTED
PHENCYCLIDINE (PCP) UR S: NOT DETECTED
Tricyclic, Ur Screen: NOT DETECTED

## 2017-03-19 LAB — TSH: TSH: 2.52 u[IU]/mL (ref 0.350–4.500)

## 2017-03-19 LAB — BRAIN NATRIURETIC PEPTIDE: B NATRIURETIC PEPTIDE 5: 153 pg/mL — AB (ref 0.0–100.0)

## 2017-03-19 LAB — GLUCOSE, CAPILLARY: Glucose-Capillary: 104 mg/dL — ABNORMAL HIGH (ref 65–99)

## 2017-03-19 MED ORDER — NITROGLYCERIN IN D5W 200-5 MCG/ML-% IV SOLN
0.0000 ug/min | Freq: Once | INTRAVENOUS | Status: AC
  Administered 2017-03-19: 45 ug/min via INTRAVENOUS
  Administered 2017-03-19: 40 ug/min via INTRAVENOUS

## 2017-03-19 MED ORDER — HYDRALAZINE HCL 20 MG/ML IJ SOLN
10.0000 mg | Freq: Once | INTRAMUSCULAR | Status: AC
  Administered 2017-03-19: 10 mg via INTRAVENOUS
  Filled 2017-03-19: qty 1

## 2017-03-19 MED ORDER — CLONIDINE HCL 0.1 MG PO TABS
0.2000 mg | ORAL_TABLET | Freq: Two times a day (BID) | ORAL | Status: DC
  Administered 2017-03-19 – 2017-03-20 (×2): 0.2 mg via ORAL
  Filled 2017-03-19 (×2): qty 2

## 2017-03-19 MED ORDER — HYDRALAZINE HCL 20 MG/ML IJ SOLN
10.0000 mg | Freq: Four times a day (QID) | INTRAMUSCULAR | Status: DC | PRN
  Filled 2017-03-19: qty 1

## 2017-03-19 MED ORDER — NITROGLYCERIN IN D5W 200-5 MCG/ML-% IV SOLN: 0.0000 ug/min | INTRAVENOUS | Status: DC

## 2017-03-19 MED ORDER — CLONIDINE HCL 0.1 MG PO TABS: 0.1000 mg | ORAL_TABLET | Freq: Every day | ORAL | Status: DC

## 2017-03-19 MED ORDER — NICOTINE 21 MG/24HR TD PT24
21.0000 mg | MEDICATED_PATCH | Freq: Every day | TRANSDERMAL | Status: DC
  Administered 2017-03-19: 21 mg via TRANSDERMAL
  Filled 2017-03-19: qty 1

## 2017-03-19 MED ORDER — FUROSEMIDE 10 MG/ML IJ SOLN: 40.0000 mg | Freq: Four times a day (QID) | INTRAMUSCULAR | Status: DC

## 2017-03-19 MED ORDER — FUROSEMIDE 10 MG/ML IJ SOLN
40.0000 mg | Freq: Two times a day (BID) | INTRAMUSCULAR | Status: DC
  Administered 2017-03-19 (×2): 40 mg via INTRAVENOUS
  Filled 2017-03-19 (×2): qty 4

## 2017-03-19 MED ORDER — ONDANSETRON HCL 4 MG/2ML IJ SOLN: 4.0000 mg | Freq: Four times a day (QID) | INTRAMUSCULAR | Status: DC | PRN

## 2017-03-19 MED ORDER — NITROGLYCERIN IN D5W 200-5 MCG/ML-% IV SOLN
INTRAVENOUS | Status: AC
  Filled 2017-03-19: qty 250

## 2017-03-19 MED ORDER — FUROSEMIDE 20 MG PO TABS: 40.0000 mg | ORAL_TABLET | Freq: Every day | ORAL | Status: DC

## 2017-03-19 MED ORDER — ACETAMINOPHEN 650 MG RE SUPP: 650.0000 mg | Freq: Four times a day (QID) | RECTAL | Status: DC | PRN

## 2017-03-19 MED ORDER — NITROGLYCERIN 2 % TD OINT
1.0000 [in_us] | TOPICAL_OINTMENT | Freq: Once | TRANSDERMAL | Status: AC
  Administered 2017-03-19: 1 [in_us] via TOPICAL
  Filled 2017-03-19: qty 1

## 2017-03-19 MED ORDER — ALPRAZOLAM 0.5 MG PO TABS
0.5000 mg | ORAL_TABLET | ORAL | Status: AC
  Administered 2017-03-19: 0.5 mg via ORAL
  Filled 2017-03-19: qty 1

## 2017-03-19 MED ORDER — ENOXAPARIN SODIUM 40 MG/0.4ML ~~LOC~~ SOLN
40.0000 mg | Freq: Two times a day (BID) | SUBCUTANEOUS | Status: DC
  Administered 2017-03-19: 40 mg via SUBCUTANEOUS
  Filled 2017-03-19 (×2): qty 0.4

## 2017-03-19 MED ORDER — CLONIDINE HCL 0.1 MG PO TABS
0.1000 mg | ORAL_TABLET | Freq: Two times a day (BID) | ORAL | Status: DC
  Administered 2017-03-19: 0.1 mg via ORAL
  Filled 2017-03-19: qty 1

## 2017-03-19 MED ORDER — NICARDIPINE HCL IN NACL 20-0.86 MG/200ML-% IV SOLN
3.0000 mg/h | INTRAVENOUS | Status: DC
  Filled 2017-03-19: qty 200

## 2017-03-19 MED ORDER — FUROSEMIDE 10 MG/ML IJ SOLN
20.0000 mg | Freq: Once | INTRAMUSCULAR | Status: AC
  Administered 2017-03-19: 20 mg via INTRAVENOUS
  Filled 2017-03-19: qty 4

## 2017-03-19 MED ORDER — OXYCODONE-ACETAMINOPHEN 5-325 MG PO TABS
1.0000 | ORAL_TABLET | Freq: Once | ORAL | Status: AC
  Administered 2017-03-19: 1 via ORAL
  Filled 2017-03-19: qty 1

## 2017-03-19 MED ORDER — AMLODIPINE BESYLATE 5 MG PO TABS
5.0000 mg | ORAL_TABLET | Freq: Every day | ORAL | Status: DC
  Administered 2017-03-19: 5 mg via ORAL
  Filled 2017-03-19: qty 1

## 2017-03-19 MED ORDER — CARVEDILOL 12.5 MG PO TABS
12.5000 mg | ORAL_TABLET | Freq: Two times a day (BID) | ORAL | Status: DC
  Administered 2017-03-19 – 2017-03-20 (×3): 12.5 mg via ORAL
  Filled 2017-03-19 (×3): qty 1

## 2017-03-19 MED ORDER — DOCUSATE SODIUM 100 MG PO CAPS
100.0000 mg | ORAL_CAPSULE | Freq: Two times a day (BID) | ORAL | Status: DC
  Administered 2017-03-19: 100 mg via ORAL
  Filled 2017-03-19 (×2): qty 1

## 2017-03-19 MED ORDER — ONDANSETRON HCL 4 MG PO TABS: 4.0000 mg | ORAL_TABLET | Freq: Four times a day (QID) | ORAL | Status: DC | PRN

## 2017-03-19 MED ORDER — ALPRAZOLAM 0.5 MG PO TABS
0.5000 mg | ORAL_TABLET | Freq: Three times a day (TID) | ORAL | Status: DC | PRN
  Administered 2017-03-19: 0.5 mg via ORAL
  Filled 2017-03-19: qty 1

## 2017-03-19 MED ORDER — ASPIRIN 81 MG PO CHEW
324.0000 mg | CHEWABLE_TABLET | Freq: Once | ORAL | Status: AC
  Administered 2017-03-19: 324 mg via ORAL
  Filled 2017-03-19: qty 4

## 2017-03-19 MED ORDER — NITROGLYCERIN IN D5W 200-5 MCG/ML-% IV SOLN
INTRAVENOUS | Status: AC
  Administered 2017-03-19: 45 ug/min via INTRAVENOUS
  Filled 2017-03-19: qty 250

## 2017-03-19 MED ORDER — AMLODIPINE BESYLATE 10 MG PO TABS
10.0000 mg | ORAL_TABLET | Freq: Every day | ORAL | Status: DC
  Administered 2017-03-20: 10 mg via ORAL
  Filled 2017-03-19: qty 1

## 2017-03-19 MED ORDER — ACETAMINOPHEN 325 MG PO TABS
650.0000 mg | ORAL_TABLET | Freq: Four times a day (QID) | ORAL | Status: DC | PRN
  Administered 2017-03-19: 650 mg via ORAL
  Filled 2017-03-19: qty 2

## 2017-03-19 MED ORDER — AMLODIPINE BESYLATE 5 MG PO TABS
5.0000 mg | ORAL_TABLET | Freq: Once | ORAL | Status: AC
  Administered 2017-03-19: 5 mg via ORAL
  Filled 2017-03-19: qty 1

## 2017-03-19 MED ORDER — HYDROCODONE-ACETAMINOPHEN 5-325 MG PO TABS
1.0000 | ORAL_TABLET | ORAL | Status: DC | PRN
  Administered 2017-03-19 (×2): 1 via ORAL
  Filled 2017-03-19 (×2): qty 1

## 2017-03-19 MED ORDER — HYDRALAZINE HCL 20 MG/ML IJ SOLN: 10.0000 mg | Freq: Four times a day (QID) | INTRAMUSCULAR | Status: DC | PRN

## 2017-03-19 NOTE — ED Notes (Signed)
md aware of blood pressure and in to examine pt.

## 2017-03-19 NOTE — Consult Note (Signed)
[facility] Critical Care Medicine Consultation   ASSESSMENT/PLAN   Hypertensive emergency. Patient complaining of headache, chest x-ray reveals pulmonary edema with small effusions, EKG reveals hypertensive abnormalities with an ossific ST-T wave changes. Patient is presently on a nitroglycerin infusion, will change to oral antihypertensives. Patient presently on amlodipine, Coreg and clonidine along with diuretic therapy. Patient is a 27 had 2 L diuresis. Will check echocardiogram.   RINTAKE / OUTPUT:  Intake/Output Summary (Last 24 hours) at 03/19/2017 0754 Last data filed at 03/19/2017 5409 Gross per 24 hour  Intake -  Output 2200 ml  Net -2200 ml    Name: BOLUWATIFE FLIGHT MRN: [id number] DOB: [birth year]    ADMISSION DATE:  03/19/2017 CONSULTATION DATE:  03/19/2017  REFERRING MD :  Dr. Nemiah Commander  CHIEF COMPLAINT:  Shortness of breath   HISTORY OF PRESENT ILLNESS:  Mr. Aragones is a 27 year old Afro-American male with a past medical history remarkable for hypertension, congestive heart failure presented to the emergency department with complaints of shortness of breath. Per patient he has had worsening shortness of breath over the past 1 week. He is also complaining of a nonproductive cough. In emergency department he was found to be hypertensive and started on a nitroglycerin drip for hypertensive emergency. Pertinent laboratory evaluation revealed chest x-ray with pulmonary edema and bilateral effusions. Urine drug skin was positive for cannabinoids.  PAST MEDICAL HISTORY :  Past Medical History:  Diagnosis Date  . CHF (congestive heart failure) (HCC)   . Hypertension    Past Surgical History:  Procedure Laterality Date  . THYROID SURGERY     Prior to Admission medications   Medication Sig Start Date End Date Taking? Authorizing Provider  amLODipine (NORVASC) 5 MG tablet Take 1 tablet (5 mg total) by mouth daily. Patient not taking: Reported on 03/19/2017 08/11/15    Sharman Cheek, MD  carvedilol (COREG) 25 MG tablet Take 1 tablet (25 mg total) by mouth 2 (two) times daily with a meal. Patient not taking: Reported on 03/19/2017 09/09/14   Katharina Caper, MD  cloNIDine (CATAPRES) 0.1 MG tablet Take 0.1 mg by mouth daily.     [provider]  furosemide (LASIX) 40 MG tablet Take 40 mg by mouth daily.    [provider]  methocarbamol (ROBAXIN) 500 MG tablet Take 1 tablet (500 mg total) by mouth every 6 (six) hours as needed for muscle spasms. Patient not taking: Reported on 03/19/2017 08/11/15   Sharman Cheek, MD  naproxen (NAPROSYN) 500 MG tablet Take 1 tablet (500 mg total) by mouth 2 (two) times daily with a meal. Patient not taking: Reported on 03/19/2017 08/11/15   Sharman Cheek, MD   No Known Allergies  FAMILY HISTORY:  Family History  Problem Relation Age of Onset  . Diabetes Mother   . Kidney failure Mother   . Hypertension Father   . Diabetes Maternal Grandmother   . Breast cancer Paternal Grandmother    SOCIAL HISTORY:  reports that he has been smoking.  He has been smoking about 0.50 packs per day. he has never used smokeless tobacco. He reports that he drinks alcohol. He reports that he uses drugs. Drugs: Marijuana and Cocaine. Frequency: 4.00 times per week.  REVIEW OF SYSTEMS:   Constitutional: Feels well. Cardiovascular: No chest pain.  Pulmonary: Denies dyspnea.   The remainder of systems were reviewed and were found to be negative other than what is documented in the HPI.    VITAL SIGNS: Temp:  [97.6 F (36.4  C)-98.6 F (37 C)] 97.6 F (36.4 C) (02/24 0655) Pulse Rate:  [72-91] 90 (02/24 0700) Resp:  [13-37] 19 (02/24 0700) BP: (145-195)/(83-150) 167/150 (02/24 0700) SpO2:  [90 %-98 %] 97 % (02/24 0700) Weight:  [127 kg (280 lb)-140.7 kg (310 lb 3 oz)] 140.7 kg (310 lb 3 oz) (02/24 0655) HEMODYNAMICS:  INTAKE / OUTPUT:  Intake/Output Summary (Last 24 hours) at 03/19/2017 0754 Last data filed at  03/19/2017 [id number] Gross per 24 hour  Intake -  Output 2200 ml  Net -2200 ml    Physical Examination:   VS: BP (!) 167/150   Pulse 90   Temp 97.6 F (36.4 C) (Oral)   Resp 19   Ht 5\' 7"  (1.702 m)   Wt (!) 140.7 kg (310 lb 3 oz)   SpO2 97%   BMI 48.58 kg/m   General Appearance: Patient is awake, alert, complaining of headache Neuro:without focal findings, mental status, speech normal,. HEENT: PERRLA, EOM intact, no ptosis, no other lesions noticed;  Pulmonary: Scant basilar crackles noted CardiovascularNormal S1,S2.  No m/r/g.    Abdomen: Benign, Soft, non-tender, No masses, hepatosplenomegaly, No lymphadenopathy. Skin:   warm, no rashes, no ecchymosis  Extremities: normal, no cyanosis, clubbing, no edema, warm with normal capillary refill.   LABS: Reviewed  LABORATORY PANEL:   CBC Recent Labs  Lab 03/19/17 0241  WBC 10.2  HGB 14.9  HCT 45.1  PLT 214    Chemistries  Recent Labs  Lab 03/19/17 0241  NA 141  K 3.5  CL 109  CO2 25  GLUCOSE 104*  BUN 20  CREATININE 1.09  CALCIUM 8.6*    Recent Labs  Lab 03/19/17 0654  GLUCAP 104*   No results for input(s): PHART, PCO2ART, PO2ART in the last 168 hours. No results for input(s): AST, ALT, ALKPHOS, BILITOT, ALBUMIN in the last 168 hours.  Cardiac Enzymes Recent Labs  Lab 03/19/17 0600  TROPONINI <0.03    RADIOLOGY:  Dg Chest 2 View  Result Date: 03/18/2017 CLINICAL DATA:  Shortness of breath for several weeks. EXAM: CHEST  2 VIEW COMPARISON:  01/05/2016 and prior radiograph FINDINGS: Cardiomegaly and pulmonary vascular congestion noted. There is no evidence of focal airspace disease, pulmonary edema, suspicious pulmonary nodule/mass, pleural effusion, or pneumothorax. No acute bony abnormalities are identified. IMPRESSION: Cardiomegaly with pulmonary vascular congestion. Electronically Signed   By: Harmon PierJeffrey  Hu M.D.   On: 03/18/2017 22:38    Tora KindredJohn Eulon Allnutt, DO  03/19/2017, 7:54 AM

## 2017-03-19 NOTE — ED Notes (Signed)
Delay in taking pt to floor, ccu rn requesting additional iv access on pt before pt arrives to floor. Elon JesterMichele, rn in to attempt int.

## 2017-03-19 NOTE — Progress Notes (Signed)
Patient has been rude to staff and threatened to leave twice during shift.  This RN along with Florentina AddisonKatie, RN charge nurse have both spoke with him separately about him needing to stay for medical care until his blood pressure is well controlled with oral medications.  Patient is not compliant at times with blood pressure being monitored and continues to bend his arm and moves it when blood pressure is being taken regardless of nurse explaining to patient that he needs to keep his arm still when blood pressure cuff is taking his blood pressure for an accurate reading.  Patient stated "I'm not gonna just sit here and stay still."  Patient takes blood pressure cuff off at times as well.

## 2017-03-19 NOTE — ED Provider Notes (Signed)
[facility] Emergency Department Provider Note   ____________________________________________   First MD Initiated Contact with Patient 03/19/17 [phone]     (approximate)  I have reviewed the triage vital signs and the nursing notes.   HISTORY  Chief Complaint Cough and Shortness of Breath    HPI Terrance Hawkins is a 27 y.o. male who presents to the ED from home with a chief complaint of shortness of breath.  Patient has a history of congestive heart failure, malignant hypertension, substance abuse, chronic right knee pain since he was 27 years old, who has been out of his medicines for a year.  Has noticed progressive shortness of breath especially with exertion over the past several weeks.  Currently has dry cough and nasal congestion as well.  Denies fever, chills, chest pain, abdominal pain, nausea, vomiting, diarrhea.  No recent travel or trauma.   Past Medical History:  Diagnosis Date  . CHF (congestive heart failure) (HCC)   . Hypertension     Patient Active Problem List   Diagnosis Date Noted  . Depression 09/26/2014  . Chronic diastolic heart failure (HCC) 09/26/2014  . Essential hypertension, malignant 09/09/2014  . Morbid obesity (HCC) 09/09/2014  . Obstructive sleep apnea 09/09/2014  . Hypokalemia 09/09/2014  . Tobacco abuse 09/09/2014  . Cocaine abuse (HCC) 09/09/2014  . Marijuana abuse 09/09/2014  . Knee pain 09/09/2014  . CHF (congestive heart failure) (HCC) 09/07/2014    Past Surgical History:  Procedure Laterality Date  . THYROID SURGERY      Prior to Admission medications   Medication Sig Start Date End Date Taking? Authorizing Provider  amLODipine (NORVASC) 5 MG tablet Take 1 tablet (5 mg total) by mouth daily. Patient not taking: Reported on 03/19/2017 08/11/15   Sharman Cheek, MD  carvedilol (COREG) 25 MG tablet Take 1 tablet (25 mg total) by mouth 2 (two) times daily with a meal. Patient not taking: Reported on  03/19/2017 09/09/14   Katharina Caper, MD  cloNIDine (CATAPRES) 0.1 MG tablet Take 0.1 mg by mouth daily.     [provider]  furosemide (LASIX) 40 MG tablet Take 40 mg by mouth daily.    [provider]  methocarbamol (ROBAXIN) 500 MG tablet Take 1 tablet (500 mg total) by mouth every 6 (six) hours as needed for muscle spasms. Patient not taking: Reported on 03/19/2017 08/11/15   Sharman Cheek, MD  naproxen (NAPROSYN) 500 MG tablet Take 1 tablet (500 mg total) by mouth 2 (two) times daily with a meal. Patient not taking: Reported on 03/19/2017 08/11/15   Sharman Cheek, MD    Allergies Patient has no known allergies.  Family History  Problem Relation Age of Onset  . Diabetes Mother   . Kidney failure Mother   . Hypertension Father   . Diabetes Maternal Grandmother   . Breast cancer Paternal Grandmother     Social History Social History   Tobacco Use  . Smoking status: Current Every Day Smoker    Packs/day: 0.50  . Smokeless tobacco: Never Used  Substance Use Topics  . Alcohol use: Yes    Alcohol/week: 0.0 oz    Comment: occassional  . Drug use: Yes    Frequency: 4.0 times per week    Types: Marijuana, Cocaine    Comment: last use was before 09/07/2014  smokes 3-4 blunts a day about 1 gram each    Review of Systems  Constitutional: No fever/chills. Eyes: No visual changes. ENT: Positive for nasal  congestion.  No sore throat. Cardiovascular: Denies chest pain. Respiratory: Positive for cough and shortness of breath. Gastrointestinal: No abdominal pain.  No nausea, no vomiting.  No diarrhea.  No constipation. Genitourinary: Negative for dysuria. Musculoskeletal: Positive for chronic right knee pain.  Negative for back pain. Skin: Negative for rash. Neurological: Negative for headaches, focal weakness or numbness.   ____________________________________________   PHYSICAL EXAM:  VITAL SIGNS: ED Triage Vitals  Enc Vitals Group     BP 03/18/17  2201 (!) 179/125     Pulse Rate 03/18/17 2201 91     Resp 03/18/17 2201 18     Temp 03/18/17 2201 98.6 F (37 C)     Temp Source 03/18/17 2201 Oral     SpO2 03/18/17 2201 97 %     Weight 03/18/17 2202 280 lb (127 kg)     Height 03/18/17 2202 5\' 7"  (1.702 m)     Head Circumference --      Peak Flow --      Pain Score 03/18/17 2204 10     Pain Loc --      Pain Edu? --      Excl. in GC? --     Constitutional: Alert and oriented. Well appearing and in no acute distress. Eyes: Conjunctivae are normal. PERRL. EOMI. Head: Atraumatic. Nose: No congestion/rhinnorhea. Mouth/Throat: Mucous membranes are moist.  Oropharynx non-erythematous. Neck: No stridor.   Cardiovascular: Normal rate, regular rhythm. Grossly normal heart sounds.  Good peripheral circulation. Respiratory: Increased respiratory effort.  No retractions. Lungs with bibasilar rales. Gastrointestinal: Obese.  Soft and nontender. No distention. No abdominal bruits. No CVA tenderness. Musculoskeletal: Right anterior knee tender to palpation.  Full range of motion without pain.  2+ femoral and distal pulses.  Bilateral calves symmetrical and nontender.  No joint effusions. Neurologic:  Normal speech and language. No gross focal neurologic deficits are appreciated.  Skin:  Skin is warm, dry and intact. No rash noted. Psychiatric: Mood and affect are normal. Speech and behavior are normal.  ____________________________________________   LABS (all labs ordered are listed, but only abnormal results are displayed)  Labs Reviewed  CBC WITH DIFFERENTIAL/PLATELET - Abnormal; Notable for the following components:      Result Value   Monocytes Absolute 1.1 (*)    All other components within normal limits  BASIC METABOLIC PANEL - Abnormal; Notable for the following components:   Glucose, Bld 104 (*)    Calcium 8.6 (*)    All other components within normal limits  TROPONIN I - Abnormal; Notable for the following components:    Troponin I 0.04 (*)    All other components within normal limits  BRAIN NATRIURETIC PEPTIDE - Abnormal; Notable for the following components:   B Natriuretic Peptide 153.0 (*)    All other components within normal limits  URINE DRUG SCREEN, QUALITATIVE ([facility] ONLY) - Abnormal; Notable for the following components:   Cannabinoid 50 Ng, Ur [place] POSITIVE (*)    All other components within normal limits  TROPONIN I   ____________________________________________  EKG  ED ECG REPORT I, SUNG,JADE J, the attending physician, personally viewed and interpreted this ECG.   Date: 03/19/2017  EKG Time: 2204  Rate: 92  Rhythm: normal EKG, normal sinus rhythm  Axis: WNL  Intervals:none  ST&T Change: T wave inversion in inferior leads New T wave inversion compared to 01/05/2016  ____________________________________________  RADIOLOGY  ED MD interpretation: Pulmonary edema  Official radiology report(s): Dg Chest 2 View  Result Date:  03/18/2017 CLINICAL DATA:  Shortness of breath for several weeks. EXAM: CHEST  2 VIEW COMPARISON:  01/05/2016 and prior radiograph FINDINGS: Cardiomegaly and pulmonary vascular congestion noted. There is no evidence of focal airspace disease, pulmonary edema, suspicious pulmonary nodule/mass, pleural effusion, or pneumothorax. No acute bony abnormalities are identified. IMPRESSION: Cardiomegaly with pulmonary vascular congestion. Electronically Signed   By: Harmon Pier M.D.   On: 03/18/2017 22:38    ____________________________________________   PROCEDURES  Procedure(s) performed: None  Procedures  Critical Care performed: Yes, see critical care note(s)   CRITICAL CARE Performed by: Irean Hong   Total critical care time: 45 minutes  Critical care time was exclusive of separately billable procedures and treating other patients.  Critical care was necessary to treat or prevent imminent or life-threatening deterioration.  Critical care was time spent  personally by me on the following activities: development of treatment plan with patient and/or surrogate as well as nursing, discussions with consultants, evaluation of patient's response to treatment, examination of patient, obtaining history from patient or surrogate, ordering and performing treatments and interventions, ordering and review of laboratory studies, ordering and review of radiographic studies, pulse oximetry and re-evaluation of patient's condition.  ____________________________________________   INITIAL IMPRESSION / ASSESSMENT AND PLAN / ED COURSE  As part of my medical decision making, I reviewed the following data within the electronic MEDICAL RECORD NUMBER Nursing notes reviewed and incorporated, Labs reviewed, EKG interpreted, Old EKG reviewed, Old chart reviewed, Radiograph reviewed and Notes from prior ED visits.   27 year old male with a history of congestive heart failure, hypertension, substance abuse, noncompliant with medication who presents with shortness of breath. Differential includes, but is not limited to, viral syndrome, bronchitis including COPD exacerbation, pneumonia, reactive airway disease including asthma, CHF including exacerbation with or without pulmonary/interstitial edema, pneumothorax, ACS, thoracic trauma, and pulmonary embolism.  Patient's blood pressure noted. Will administer ASA, ntg paste, Lasix and reassess.  Clinical Course as of Mar 19 510  Wynelle Link Mar 19, 2017  0327 Mild elevation in troponin noted.  Will repeat timed troponin.  [JS]  0358 UOP .  Blood pressure remains elevated.  Will try hydralazine.  [JS]  0511 Blood pressure remains persistently elevated.  Will initiate nitroglycerin drip.  Discussed with hospitalist who will evaluate patient in the emergency department for admission.  [JS]    Clinical Course User Index [JS] Irean Hong, MD     ____________________________________________   FINAL CLINICAL IMPRESSION(S) / ED  DIAGNOSES  Final diagnoses:  Shortness of breath  Acute on chronic congestive heart failure, unspecified heart failure type (HCC)  Chronic pain of right knee  Marijuana abuse  Hypertensive urgency     ED Discharge Orders    None       Note:  This document was prepared using Dragon voice recognition software and may include unintentional dictation errors.    Irean Hong, MD 03/19/17 [phone]

## 2017-03-19 NOTE — Progress Notes (Signed)
Patient arrived from ED at approximately [phone]. CHG wipedown completed. Patient comfortable in the bed at th is time. Nitro is running at 45 mcg.

## 2017-03-19 NOTE — Progress Notes (Signed)
RN asked Dr. Lonn Georgiaonforti for nitro drip parameters.  MD gave order to keep Systolic blood pressure less than 160 and not below 120.

## 2017-03-19 NOTE — ED Notes (Signed)
Critical troponin of 0.04 called from lab. Dr. Sung notified, no new orders received.  

## 2017-03-19 NOTE — H&P (Addendum)
Terrance Hawkins is an 27 y.o. male.   Chief Complaint: Shortness of breath HPI: The patient with past medical history of CHF and hypertension presents to the emergency department complaining of shortness of breath.  The patient states that he has had worsening dyspnea for at least 1 week.  He also complains of cough which she feels is wet but is nonproductive.  In the emergency department patient blood pressure was found to be greatly elevated which prompted the treating team to start a nitroglycerin drip.  The patient's pressure slowly improved but still remained grossly elevated.  Chest x-ray showed small effusions as well as likely pulmonary edema consistent with CHF exacerbation.  The patient received a dose of IV Lasix prior to the emergency department staff calling the hospitalist service for admission.  Past Medical History:  Diagnosis Date  . CHF (congestive heart failure) ([place])   . Hypertension     Past Surgical History:  Procedure Laterality Date  . THYROID SURGERY      Family History  Problem Relation Age of Onset  . Diabetes Mother   . Kidney failure Mother   . Hypertension Father   . Diabetes Maternal Grandmother   . Breast cancer Paternal Grandmother    Social History:  reports that he has been smoking.  He has been smoking about 0.50 packs per day. he has never used smokeless tobacco. He reports that he drinks alcohol. He reports that he uses drugs. Drugs: Marijuana and Cocaine. Frequency: 4.00 times per week.  Allergies: No Known Allergies  Medications Prior to Admission  Medication Sig Dispense Refill  . amLODipine (NORVASC) 5 MG tablet Take 1 tablet (5 mg total) by mouth daily. (Patient not taking: Reported on 03/19/2017) 30 tablet 1  . carvedilol (COREG) 25 MG tablet Take 1 tablet (25 mg total) by mouth 2 (two) times daily with a meal. (Patient not taking: Reported on 03/19/2017) 60 tablet 2  . cloNIDine (CATAPRES) 0.1 MG tablet Take 0.1 mg by mouth daily.     .  furosemide (LASIX) 40 MG tablet Take 40 mg by mouth daily.    . methocarbamol (ROBAXIN) 500 MG tablet Take 1 tablet (500 mg total) by mouth every 6 (six) hours as needed for muscle spasms. (Patient not taking: Reported on 03/19/2017) 20 tablet 0  . naproxen (NAPROSYN) 500 MG tablet Take 1 tablet (500 mg total) by mouth 2 (two) times daily with a meal. (Patient not taking: Reported on 03/19/2017) 20 tablet 0    Results for orders placed or performed during the hospital encounter of 03/19/17 (from the past 48 hour(s))  CBC with Differential     Status: Abnormal   Collection Time: 03/19/17  2:41 AM  Result Value Ref Range   WBC 10.2 3.8 - 10.6 K/uL   RBC 5.21 4.40 - 5.90 MIL/uL   Hemoglobin 14.9 13.0 - 18.0 g/dL   HCT 45.1 40.0 - 52.0 %   MCV 86.6 80.0 - 100.0 fL   MCH 28.7 26.0 - 34.0 pg   MCHC 33.1 32.0 - 36.0 g/dL   RDW 14.4 11.5 - 14.5 %   Platelets 214 150 - 440 K/uL   Neutrophils Relative % 60 %   Neutro Abs 6.2 1.4 - 6.5 K/uL   Lymphocytes Relative 25 %   Lymphs Abs 2.6 1.0 - 3.6 K/uL   Monocytes Relative 11 %   Monocytes Absolute 1.1 (H) 0.2 - 1.0 K/uL   Eosinophils Relative 3 %   Eosinophils Absolute 0.3  0 - 0.7 K/uL   Basophils Relative 1 %   Basophils Absolute 0.1 0 - 0.1 K/uL    Comment: Performed at [facility], [address] [place], [place] [address]  Basic metabolic panel     Status: Abnormal   Collection Time: 03/19/17  2:41 AM  Result Value Ref Range   Sodium 141 135 - 145 mmol/L   Potassium 3.5 3.5 - 5.1 mmol/L   Chloride 109 101 - 111 mmol/L   CO2 25 22 - 32 mmol/L   Glucose, Bld 104 (H) 65 - 99 mg/dL   BUN 20 6 - 20 mg/dL   Creatinine, Ser 1.09 0.61 - 1.24 mg/dL   Calcium 8.6 (L) 8.9 - 10.3 mg/dL   GFR calc non Af Amer >60 >60 mL/min   GFR calc Af Amer >60 >60 mL/min    Comment: (NOTE) The eGFR has been calculated using the CKD EPI equation. This calculation has not been validated in all clinical situations. eGFR's persistently <60 mL/min  signify possible Chronic Kidney Disease.    Anion gap 7 5 - 15    Comment: Performed at [facility], [address] [place], [place] [address]  Troponin I     Status: Abnormal   Collection Time: 03/19/17  2:41 AM  Result Value Ref Range   Troponin I 0.04 (HH) <0.03 ng/mL    Comment: CRITICAL RESULT CALLED TO, READ BACK BY AND VERIFIED WITH APRIL BRUMGARD AT 0315 03/19/17.PMH Performed at [facility], [address] [place], [place] [address]   Brain natriuretic peptide     Status: Abnormal   Collection Time: 03/19/17  2:41 AM  Result Value Ref Range   B Natriuretic Peptide 153.0 (H) 0.0 - 100.0 pg/mL    Comment: Performed at [facility], [address], [place], [place] [address]  Urine Drug Screen, Qualitative     Status: Abnormal   Collection Time: 03/19/17  2:41 AM  Result Value Ref Range   Tricyclic, Ur Screen NONE DETECTED NONE DETECTED   Amphetamines, Ur Screen NONE DETECTED NONE DETECTED   MDMA (Ecstasy)Ur Screen NONE DETECTED NONE DETECTED   Cocaine Metabolite,Ur [place] NONE DETECTED NONE DETECTED   Opiate, Ur Screen NONE DETECTED NONE DETECTED   Phencyclidine (PCP) Ur S NONE DETECTED NONE DETECTED   Cannabinoid 50 Ng, Ur [place] POSITIVE (A) NONE DETECTED   Barbiturates, Ur Screen NONE DETECTED NONE DETECTED   Benzodiazepine, Ur Scrn NONE DETECTED NONE DETECTED   Methadone Scn, Ur NONE DETECTED NONE DETECTED    Comment: (NOTE) Tricyclics + metabolites, urine    Cutoff 1000 ng/mL Amphetamines + metabolites, urine  Cutoff 1000 ng/mL MDMA (Ecstasy), urine              Cutoff 500 ng/mL Cocaine Metabolite, urine          Cutoff 300 ng/mL Opiate + metabolites, urine        Cutoff 300 ng/mL Phencyclidine (PCP), urine         Cutoff 25 ng/mL Cannabinoid, urine                 Cutoff 50 ng/mL Barbiturates + metabolites, urine  Cutoff 200 ng/mL Benzodiazepine, urine              Cutoff 200 ng/mL Methadone, urine                   Cutoff 300 ng/mL The  urine drug screen provides only a preliminary, unconfirmed analytical  test result and should not be used for non-medical purposes. Clinical consideration and professional judgment should be applied to any positive drug screen result due to possible interfering substances. A more specific alternate chemical method must be used in order to obtain a confirmed analytical result. Gas chromatography / mass spectrometry (GC/MS) is the preferred confirmat ory method. Performed at [facility], [address] [place], [place] [address]   Troponin I     Status: None   Collection Time: 03/19/17  6:00 AM  Result Value Ref Range   Troponin I <0.03 <0.03 ng/mL    Comment: Performed at [facility], [address] [place], [place] [address]  Glucose, capillary     Status: Abnormal   Collection Time: 03/19/17  6:54 AM  Result Value Ref Range   Glucose-Capillary 104 (H) 65 - 99 mg/dL   Dg Chest 2 View  Result Date: 03/18/2017 CLINICAL DATA:  Shortness of breath for several weeks. EXAM: CHEST  2 VIEW COMPARISON:  01/05/2016 and prior radiograph FINDINGS: Cardiomegaly and pulmonary vascular congestion noted. There is no evidence of focal airspace disease, pulmonary edema, suspicious pulmonary nodule/mass, pleural effusion, or pneumothorax. No acute bony abnormalities are identified. IMPRESSION: Cardiomegaly with pulmonary vascular congestion. Electronically Signed   By: Margarette Canada M.D.   On: 03/18/2017 22:38    Review of Systems  Constitutional: Negative for chills and fever.  HENT: Negative for sore throat and tinnitus.   Eyes: Negative for blurred vision and redness.  Respiratory: Positive for cough and shortness of breath.   Cardiovascular: Negative for chest pain, palpitations, orthopnea and PND.  Gastrointestinal: Negative for abdominal pain, diarrhea, nausea and vomiting.  Genitourinary: Negative for dysuria, frequency and urgency.  Musculoskeletal: Negative for joint  pain and myalgias.  Skin: Negative for rash.       No lesions  Neurological: Negative for speech change, focal weakness and weakness.  Endo/Heme/Allergies: Does not bruise/bleed easily.       No temperature intolerance  Psychiatric/Behavioral: Negative for depression and suicidal ideas.    Blood pressure (!) 167/150, pulse 90, temperature 97.6 F (36.4 C), temperature source Oral, resp. rate 19, height 5' 7"  (1.702 m), weight (!) 140.7 kg (310 lb 3 oz), SpO2 97 %. Physical Exam  Vitals reviewed. Constitutional: He is oriented to person, place, and time. He appears well-developed and well-nourished. No distress.  HENT:  Head: Normocephalic and atraumatic.  Mouth/Throat: Oropharynx is clear and moist.  Eyes: Conjunctivae and EOM are normal. Pupils are equal, round, and reactive to light. No scleral icterus.  Neck: Normal range of motion. Neck supple. No JVD present. No tracheal deviation present. No thyromegaly present.  Cardiovascular: Normal rate, regular rhythm, normal heart sounds and intact distal pulses. Exam reveals no gallop and no friction rub.  No murmur heard. Respiratory: Breath sounds normal. Tachypnea noted. No respiratory distress.  GI: Soft. Bowel sounds are normal. He exhibits no distension. There is no tenderness.  Genitourinary:  Genitourinary Comments: Deferred  Musculoskeletal: Normal range of motion. He exhibits no edema.  Lymphadenopathy:    He has no cervical adenopathy.  Neurological: He is alert and oriented to person, place, and time. No cranial nerve deficit.  Skin: Skin is warm and dry. No rash noted. No erythema.  Psychiatric: He has a normal mood and affect. His behavior is normal. Judgment and thought content normal.     Assessment/Plan This is a 27 year old male admitted for CHF exacerbation. 1.  CHF: Systolic; acute on chronic.  Last EF 40-45%.  Continue diuresis.  Monitor I's and O's.  Daily weights. 2.  Hypertensive urgency: Continue nitroglycerin  drip.  Resume clonidine and amlodipine per home regimen.  I have also ordered Labetalol as needed.  Emphasized compliance with medication as an outpatient 3.  Obstructive sleep apnea: Resume CPAP nightly per home regimen 4.  Obesity: BMI is 43.9; encouraged healthy diet and exercise 5.  DVT prophylaxis: Lovenox 6.  GI prophylaxis: None The patient is a full code.  Time spent on admission orders and patient care approximately 45 minutes  Harrie Foreman, MD 03/19/2017, 7:46 AM

## 2017-03-19 NOTE — Progress Notes (Signed)
Patient admitted this morning for dyspnea and noted to have hypertensive urgency. Not taking his meds at home due to financial issues Admitted to ICU for nicardipine drip. BP still elevated For more details, see H&P this AM. Started oral meds for today-  Norvasc, clonidine, lasix. Adjust meds further as needed.

## 2017-03-19 NOTE — Progress Notes (Signed)
Went into pt's room to administer medication and pt stated that his nurse has been busy all day and he has had to tell her how to "do her motherf--cking job". Pt also expressed that he did not understand why he is here and that he just wants to leave. This RN presented pt with AMA paperwork, explaining that he is free to leave whenever he would like. Pt stated he wasn't going to sign same and would stay for now at the advice of his friends who are in the room. Will continue to monitor.

## 2017-03-19 NOTE — ED Notes (Signed)
Pt remains hypertensive, md ordering medication. Pt updated on repeat troponin time, pt verbalizes understanding.

## 2017-03-19 NOTE — Progress Notes (Signed)
Lovenox changed to 40 mg BID for CrCl >30 and BMI >40. 

## 2017-03-20 LAB — BASIC METABOLIC PANEL
Anion gap: 11 (ref 5–15)
BUN: 12 mg/dL (ref 6–20)
CHLORIDE: 101 mmol/L (ref 101–111)
CO2: 26 mmol/L (ref 22–32)
CREATININE: 0.85 mg/dL (ref 0.61–1.24)
Calcium: 9.2 mg/dL (ref 8.9–10.3)
GFR calc non Af Amer: >60 mL/min (ref >60)
Glucose, Bld: 108 mg/dL — ABNORMAL HIGH (ref 65–99)
Potassium: 3.5 mmol/L (ref 3.5–5.1)
Sodium: 138 mmol/L (ref 135–145)

## 2017-03-20 MED ORDER — FUROSEMIDE 40 MG PO TABS: 40.0000 mg | ORAL_TABLET | Freq: Every day | ORAL | 3 refills | Status: DC

## 2017-03-20 MED ORDER — FUROSEMIDE 20 MG PO TABS
40.0000 mg | ORAL_TABLET | Freq: Every day | ORAL | Status: DC
  Administered 2017-03-20: 40 mg via ORAL
  Filled 2017-03-20: qty 2

## 2017-03-20 MED ORDER — CLONIDINE HCL 0.2 MG PO TABS: 0.2000 mg | ORAL_TABLET | Freq: Two times a day (BID) | ORAL | 11 refills | Status: DC

## 2017-03-20 MED ORDER — CARVEDILOL 12.5 MG PO TABS: 12.5000 mg | ORAL_TABLET | Freq: Two times a day (BID) | ORAL | 3 refills | Status: DC

## 2017-03-20 MED ORDER — NICOTINE 21 MG/24HR TD PT24: 21.0000 mg | MEDICATED_PATCH | Freq: Every day | TRANSDERMAL | 0 refills | Status: DC

## 2017-03-20 MED ORDER — AMLODIPINE BESYLATE 10 MG PO TABS: 10.0000 mg | ORAL_TABLET | Freq: Every day | ORAL | 3 refills | Status: DC

## 2017-03-20 NOTE — Discharge Summary (Addendum)
[facility] [place] [facility]   PATIENT NAME: Terrance Hawkins    MR#:  [id number]  DATE OF BIRTH:  10-12-1990  DATE OF ADMISSION:  03/19/2017   ADMITTING PHYSICIAN: Arnaldo Natal, MD  DATE OF DISCHARGE: 03/20/17  PRIMARY CARE PHYSICIAN: Patient, No Pcp Per   ADMISSION DIAGNOSIS:   Shortness of breath [R06.02] Marijuana abuse [F12.10] Hypertensive urgency [I16.0] Chronic pain of right knee [M25.561, G89.29] Acute on chronic congestive heart failure, unspecified heart failure type (HCC) [I50.9]  DISCHARGE DIAGNOSIS:   Active Problems:   Acute on chronic systolic heart failure (HCC)   SECONDARY DIAGNOSIS:   Past Medical History:  Diagnosis Date  . CHF (congestive heart failure) (HCC)   . Hypertension     HOSPITAL COURSE:   27 year old male with past medical history significant for uncontrolled hypertension, noncompliant with medications, diastolic dysfunction and LVH presents to hospital secondary to worsening dyspnea.  1. Hypertensive urgency-started on nitroglycerin drip. He was admitted to ICU for the same. -Was not taking any oral medications at home. We started her on clonidine, Norvasc, Coreg and Lasix. -Blood pressure is still elevated but much better controlled -Has been off of nitroglycerin drip since yesterday. -Will be discharged from  2. Acute on chronic diastolic CHF exacerbation-significant LVH and diastolic dysfunction from uncontrolled hypertension. Received IV Lasix in the hospital. Discharged on oral Lasix.  3. Tobacco use disorder-counseled. Requested nicotine patch at discharge.  Patient is otherwise ambulatory. -Strongly counseled about being compliant with his medications and also following with the PCP.  DISCHARGE CONDITIONS:   Guarded  CONSULTS OBTAINED:   Treatment Team:  Tora Kindred, DO  DRUG ALLERGIES:   No Known Allergies DISCHARGE MEDICATIONS:   Allergies as of 03/20/2017   No Known  Allergies     Medication List    STOP taking these medications   methocarbamol 500 MG tablet Commonly known as:  ROBAXIN   naproxen 500 MG tablet Commonly known as:  NAPROSYN     TAKE these medications   amLODipine 10 MG tablet Commonly known as:  NORVASC Take 1 tablet (10 mg total) by mouth daily. What changed:    medication strength  how much to take   carvedilol 12.5 MG tablet Commonly known as:  COREG Take 1 tablet (12.5 mg total) by mouth 2 (two) times daily with a meal. What changed:    medication strength  how much to take   cloNIDine 0.2 MG tablet Commonly known as:  CATAPRES Take 1 tablet (0.2 mg total) by mouth 2 (two) times daily. What changed:    medication strength  how much to take  when to take this   furosemide 40 MG tablet Commonly known as:  LASIX Take 1 tablet (40 mg total) by mouth daily.   nicotine 21 mg/24hr patch Commonly known as:  NICODERM CQ - dosed in mg/24 hours Place 1 patch (21 mg total) onto the skin daily.        DISCHARGE INSTRUCTIONS:   1. PCP follow-up in 1-2 weeks  DIET:   Cardiac diet  ACTIVITY:   Activity as tolerated  OXYGEN:   Home Oxygen: No.  Oxygen Delivery: room air  DISCHARGE LOCATION:   home   If you experience worsening of your admission symptoms, develop shortness of breath, life threatening emergency, suicidal or homicidal thoughts you must seek medical attention immediately by calling 911 or calling your MD immediately  if symptoms less severe.  You Must read complete instructions/literature along  with all the possible adverse reactions/side effects for all the Medicines you take and that have been prescribed to you. Take any new Medicines after you have completely understood and accpet all the possible adverse reactions/side effects.   Please note  You were cared for by a hospitalist during your hospital stay. If you have any questions about your discharge medications or the care you  received while you were in the hospital after you are discharged, you can call the unit and asked to speak with the hospitalist on call if the hospitalist that took care of you is not available. Once you are discharged, your primary care physician will handle any further medical issues. Please note that NO REFILLS for any discharge medications will be authorized once you are discharged, as it is imperative that you return to your primary care physician (or establish a relationship with a primary care physician if you do not have one) for your aftercare needs so that they can reassess your need for medications and monitor your lab values.    On the day of Discharge:  VITAL SIGNS:   Blood pressure (!) 165/109, pulse 92, temperature (!) 97.1 F (36.2 C), temperature source Axillary, resp. rate (!) 33, height 5\' 7"  (1.702 m), weight 132 kg (291 lb 0.1 oz), SpO2 91 %.  PHYSICAL EXAMINATION:    GENERAL:  27 y.o.-year-old obese patient lying in the bed with no acute distress.  EYES: Pupils equal, round, reactive to light and accommodation. No scleral icterus. Extraocular muscles intact.  HEENT: Head atraumatic, normocephalic. Oropharynx and nasopharynx clear.  NECK:  Supple, no jugular venous distention. No thyroid enlargement, no tenderness.  LUNGS: Normal breath sounds bilaterally, no wheezing, rales,rhonchi or crepitation. No use of accessory muscles of respiration. Decreased bibasilar breath sounds CARDIOVASCULAR: S1, S2 normal. No murmurs, rubs, or gallops.  ABDOMEN: Soft, non-tender, non-distended. Bowel sounds present. No organomegaly or mass.  EXTREMITIES: No pedal edema, cyanosis, or clubbing.  NEUROLOGIC: Cranial nerves II through XII are intact. Muscle strength 5/5 in all extremities. Sensation intact. Gait not checked.  PSYCHIATRIC: The patient is alert and oriented x 3.  SKIN: No obvious rash, lesion, or ulcer.   DATA REVIEW:   CBC Recent Labs  Lab 03/19/17 0241  WBC 10.2  HGB  14.9  HCT 45.1  PLT 214    Chemistries  Recent Labs  Lab 03/20/17 0440  NA 138  K 3.5  CL 101  CO2 26  GLUCOSE 108*  BUN 12  CREATININE 0.85  CALCIUM 9.2     Microbiology Results  Results for orders placed or performed during the hospital encounter of 03/19/17  MRSA PCR Screening     Status: None   Collection Time: 03/19/17  6:52 AM  Result Value Ref Range Status   MRSA by PCR NEGATIVE NEGATIVE Final    Comment:        The GeneXpert MRSA Assay (FDA approved for NASAL specimens only), is one component of a comprehensive MRSA colonization surveillance program. It is not intended to diagnose MRSA infection nor to guide or monitor treatment for MRSA infections. Performed at [facility], [address] [place], [place] [address]     RADIOLOGY:  No results found.   Management plans discussed with the patient, family and they are in agreement.  CODE STATUS:     Code Status Orders  (From admission, onward)        Start     Ordered   03/19/17 0655  Full  code  Continuous     03/19/17 0654    Code Status History    Date Active Date Inactive Code Status Order ID Comments User Context   09/07/2014 09:48 09/09/2014 15:43 Full Code [id number]  Arnaldo Natal, MD Inpatient      TOTAL TIME TAKING CARE OF THIS PATIENT: 38 minutes.    Enid Baas M.D on 03/20/2017 at 10:29 AM  Between 7am to 6pm - Pager - [phone]  After 6pm go to [email or web address] - Social research officer, government  [facility]  Office  [phone]  CC: Primary care physician; Patient, No Pcp Per   Note: This dictation was prepared with Dragon dictation along with smaller phrase technology. Any transcriptional errors that result from this process are unintentional.

## 2017-03-20 NOTE — Progress Notes (Signed)
D/C home today.

## 2017-03-20 NOTE — Progress Notes (Signed)
Alert and oriented.  Cooperative with care and treatments.  Slept most of shift.  BP better with clonidine administration.    In no acute distress.  Will continue to monitor.

## 2017-03-20 NOTE — Progress Notes (Signed)
D/C orders and instructions received. Same was reviewed w/ the patient. Patient was [facility] [address], where he requested them to be sent electronically. He verbalized his understanding of same; signed for receiving written instructions and his understanding of same. IVs removed. Patient dressed himself and was taken by wheelchair to visitor's entrance by hospital volunteer.

## 2017-03-21 LAB — HEMOGLOBIN A1C
Hgb A1c MFr Bld: 5.3 % (ref 4.8–5.6)
Mean Plasma Glucose: 105 mg/dL

## 2017-04-21 ENCOUNTER — Other Ambulatory Visit: Payer: Self-pay | Admitting: Sports Medicine

## 2017-04-21 ENCOUNTER — Ambulatory Visit
Admission: RE | Admit: 2017-04-21 | Discharge: 2017-04-21 | Disposition: A | Discharge status: Home / Self Care | Payer: Medicaid Other | Source: Ambulatory Visit | Attending: Sports Medicine | Admitting: Sports Medicine

## 2017-04-21 DIAGNOSIS — G8929 Other chronic pain: Secondary | ICD-10-CM

## 2017-04-21 DIAGNOSIS — Q741 Congenital malformation of knee: Secondary | ICD-10-CM

## 2017-04-21 DIAGNOSIS — M25562 Pain in left knee: Secondary | ICD-10-CM

## 2017-04-21 DIAGNOSIS — M21061 Valgus deformity, not elsewhere classified, right knee: Secondary | ICD-10-CM | POA: Diagnosis not present

## 2017-04-21 DIAGNOSIS — M21062 Valgus deformity, not elsewhere classified, left knee: Secondary | ICD-10-CM | POA: Insufficient documentation

## 2017-04-21 DIAGNOSIS — M25561 Pain in right knee: Secondary | ICD-10-CM | POA: Insufficient documentation

## 2017-04-21 NOTE — Progress Notes (Signed)
The patient needs full leg length xrays for genu valgum.

## 2017-04-26 NOTE — Progress Notes (Signed)
 Chief Complaint: Chief Complaint  Patient presents with  . Establish Care  . Congestive Heart Failure   Date of Service: 04/26/2017 Date of Birth: [birth year] PCP: [facility]  History of Present Illness: Terrance Hawkins is a 27 y.o.male patient who presents for evaluation to establish care.  Patient has a history of poorly controlled hypertension largely secondary to noncompliance.  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  He has been off the majority of his medications but is recently resumed them.  Currently is on amlodipine , carvedilol , clonidine  and furosemide .  He does admit to a fairly high sodium diet.  He is also gained a lot of weight.  He is unable to exercise due to foot and hip pain.  He denies orthopnea or PND.  He denies chest pain.  Past Medical and Surgical History  Past Medical History Past Medical History:  Diagnosis Date  . Cardiomyopathy, secondary (CMS-HCC)   . Hypertension   . Sleep apnea   . Thyroid disease     Past Surgical History He has a past surgical history that includes parathryoid.   Medications and Allergies  Current Medications  Current Outpatient Medications  Medication Sig Dispense Refill  . amLODIPine  (NORVASC ) 10 MG tablet Take 1 tablet (10 mg total) by mouth once daily 90 tablet 2  . carvedilol  (COREG ) 25 MG tablet Take 1 tablet (25 mg total) by mouth 2 (two) times daily with meals 180 tablet 2  . cloNIDine  HCl (CATAPRES ) 0.2 MG tablet Take 1 tablet (0.2 mg total) by mouth 2 (two) times daily 180 tablet 2  . FUROsemide  (LASIX ) 40 MG tablet Take 1 tablet (40 mg total) by mouth once daily 90 tablet 2   No current facility-administered medications for this visit.     Allergies: Patient has no known allergies.  Social and Family History  Social History  reports that he has been smoking.  He has a 10.00 pack-year smoking history. He has never used smokeless tobacco. He reports that he drinks about  1.2 oz of alcohol per week. He reports that he has current or past drug history.  Family History Family History  Problem Relation Age of Onset  . Kidney disease Mother   . No Known Problems Father   . No Known Problems Sister   . No Known Problems Brother   . No Known Problems Sister   . No Known Problems Brother     Review of Systems  Review of Systems  Constitutional: Negative for chills, diaphoresis, fever, malaise/fatigue and weight loss.  HENT: Negative for congestion, ear discharge, hearing loss and tinnitus.   Eyes: Negative for blurred vision.  Respiratory: Positive for shortness of breath. Negative for cough, hemoptysis, sputum production and wheezing.   Cardiovascular: Negative for chest pain, palpitations, orthopnea, claudication, leg swelling and PND.  Gastrointestinal: Negative for abdominal pain, blood in stool, constipation, diarrhea, heartburn, melena, nausea and vomiting.  Genitourinary: Negative for dysuria, frequency, hematuria and urgency.  Musculoskeletal: Negative for back pain, falls, joint pain and myalgias.  Skin: Negative for itching and rash.  Neurological: Negative for dizziness, tingling, focal weakness, loss of consciousness, weakness and headaches.  Endo/Heme/Allergies: Negative for polydipsia. Does not bruise/bleed easily.  Psychiatric/Behavioral: Negative for depression, memory loss and substance abuse. The patient is not nervous/anxious.     Physical Examination   Vitals:BP (!) 180/100   Pulse 76   Resp 12   Ht 170.2 cm (5' 7)   Wt ROLLEN)  140.2 kg (309 lb)   BMI 48.40 kg/m  Ht:170.2 cm (5' 7) Wt:(!) 140.2 kg (309 lb) ADJ:Anib surface area is 2.57 meters squared. Body mass index is 48.4 kg/m.  Wt Readings from Last 3 Encounters:  04/26/17 (!) 140.2 kg (309 lb)  09/14/15 (!) 131.5 kg (290 lb)  09/15/14 (!) 135.6 kg (299 lb)    BP Readings from Last 3 Encounters:  04/26/17 (!) 180/100  04/21/17 (!) 172/118  09/14/15 (!) 209/130     General appearance  appears  in no acute distress  Head Mouth and Eye exam  Normocephalic, without obvious abnormality, atraumatic  Dentition is good  Eyes appear anicteric    Neck exam  Thyroid: normal    Nodes: no obvious adenopathy  LUNGS  Breath Sounds: Normal  Percussion: Normal  CARDIOVASCULAR  JVP   CV wave: no   HJR: no   Elevation at 90 degrees: None  Carotid   Pulse: normal pulsation bilaterally   Bruit:  None   Apex: apical impulse normal     Auscultation   Rhythm: normal sinus rhythm   S1: normal   S2: normal   Clicks: no   Rub: no   Murmurs: no murmurs    Gallop: None ABDOMEN  Liver enlargement: no  Pulsatile aorta: no  Ascites: no  Bruits: no  EXTREMITIES  Clubbing: no  Edema: trace to 1+ bilateral pedal edema  Pulses: peripheral pulses symmetrical  Femoral Bruits: no  Amputation: no SKIN  Rash: no  Cyanosis: no  Embolic phemonenon:  no  Bruising:  no NEURO  Alert and Oriented to person, place and time:  yes  Non focal:  yes  PSYCH:  Pt appears to have normal affect   LABS REVIEWED Last 3 CBC results: Lab Results  Component Value Date   WBC 8.2 02/18/2005   Lab Results  Component Value Date   HGB 15.4 02/18/2005   Lab Results  Component Value Date   HCT 0.44 02/18/2005    Lab Results  Component Value Date   PLT 225 02/18/2005    Lab Results  Component Value Date   CREATININE 0.6 12/21/2007   BUN 8 12/21/2007   NA 138 12/21/2007   K 4.1 12/21/2007   CL 106 12/21/2007   CO2 24 12/21/2007    Lab Results  Component Value Date   HGBA1C 5.2 12/21/2007    No results found for: HDL No results found for: LDLCALC No results found for: TRIG   Lab Results  Component Value Date   ALT 20 12/21/2007   AST 19 12/21/2007   ALKPHOS 42 12/21/2007    No results found for: TSH  Diagnostic Studies Reviewed:  EKG EKG demonstrated normal EKG, normal sinus rhythm.    Assessment and Plan   27 y.o. male  with    ICD-10-CM ICD-9-CM   1. Cardiomyopathy, unspecified type (CMS-HCC)-hypertensive cardiomyopathy.  Recommend strongly that he reduce his sodium intake, lose weight and be compliant with his medications.  Refills have been sent. I42.9 425.4   2. Essential hypertension-as per above.  Aggressive treatment of hypertension. I10 401.9   3. Chronic edema low-sodium diet and keep feet elevated. R60.9 782.3       Return in about 3 months (around 07/26/2017).   These notes generated with voice recognition software.  I apologize for typographical errors.  VINIE DALE JUDE, MD

## 2017-05-02 ENCOUNTER — Other Ambulatory Visit: Payer: Self-pay | Admitting: Sports Medicine

## 2017-05-02 DIAGNOSIS — M25562 Pain in left knee: Secondary | ICD-10-CM

## 2017-05-02 DIAGNOSIS — Q741 Congenital malformation of knee: Secondary | ICD-10-CM

## 2017-05-02 DIAGNOSIS — M25561 Pain in right knee: Secondary | ICD-10-CM

## 2017-05-02 DIAGNOSIS — G8929 Other chronic pain: Secondary | ICD-10-CM

## 2017-05-02 NOTE — Progress Notes (Signed)
The patient had prior bone length films but they need to be stitched.  I spoke with Dr. Allena KatzPatel and he is ok having a single leg on the film.  He needs standing alignment films of the whole extremity to assess alignment for possible osteotomy surgery.  Repeat ordered placed for standing limb alignment xrays.  Single limb per film ok.  Call [phone] and [facility] or Dr. Landry MellowKubinski for any questions or concerns.

## 2017-05-06 ENCOUNTER — Emergency Department: Payer: Medicaid Other

## 2017-05-06 ENCOUNTER — Other Ambulatory Visit: Payer: Self-pay

## 2017-05-06 DIAGNOSIS — I110 Hypertensive heart disease with heart failure: Secondary | ICD-10-CM | POA: Diagnosis not present

## 2017-05-06 DIAGNOSIS — Y92410 Unspecified street and highway as the place of occurrence of the external cause: Secondary | ICD-10-CM | POA: Insufficient documentation

## 2017-05-06 DIAGNOSIS — Y999 Unspecified external cause status: Secondary | ICD-10-CM | POA: Diagnosis not present

## 2017-05-06 DIAGNOSIS — Z79899 Other long term (current) drug therapy: Secondary | ICD-10-CM | POA: Diagnosis not present

## 2017-05-06 DIAGNOSIS — I5042 Chronic combined systolic (congestive) and diastolic (congestive) heart failure: Secondary | ICD-10-CM | POA: Diagnosis not present

## 2017-05-06 DIAGNOSIS — M25571 Pain in right ankle and joints of right foot: Secondary | ICD-10-CM | POA: Insufficient documentation

## 2017-05-06 DIAGNOSIS — F17210 Nicotine dependence, cigarettes, uncomplicated: Secondary | ICD-10-CM | POA: Insufficient documentation

## 2017-05-06 DIAGNOSIS — Y939 Activity, unspecified: Secondary | ICD-10-CM | POA: Insufficient documentation

## 2017-05-06 MED ORDER — DIPHENHYDRAMINE HCL 50 MG/ML IJ SOLN
INTRAMUSCULAR | Status: AC
  Filled 2017-05-06: qty 1

## 2017-05-06 MED ORDER — ONDANSETRON HCL 4 MG/2ML IJ SOLN
INTRAMUSCULAR | Status: AC
  Filled 2017-05-06: qty 2

## 2017-05-06 MED ORDER — HYDROMORPHONE HCL 1 MG/ML IJ SOLN
INTRAMUSCULAR | Status: AC
  Filled 2017-05-06: qty 1
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
## 2017-05-07 ENCOUNTER — Other Ambulatory Visit: Payer: Self-pay

## 2017-05-07 ENCOUNTER — Emergency Department
Admission: EM | Admit: 2017-05-07 | Discharge: 2017-05-07 | Disposition: A | Discharge status: Home / Self Care | Payer: Medicaid Other | Attending: Student in an Organized Health Care Education/Training Program | Admitting: Student in an Organized Health Care Education/Training Program

## 2017-05-07 ENCOUNTER — Emergency Department: Payer: Medicaid Other

## 2017-05-07 DIAGNOSIS — M25571 Pain in right ankle and joints of right foot: Secondary | ICD-10-CM

## 2017-05-07 MED ORDER — HYDROCODONE-ACETAMINOPHEN 5-325 MG PO TABS: 1.0000 | ORAL_TABLET | ORAL | 0 refills | Status: DC | PRN

## 2017-05-07 MED ORDER — LORAZEPAM 1 MG PO TABS
1.0000 mg | ORAL_TABLET | Freq: Once | ORAL | Status: AC
  Administered 2017-05-07: 1 mg via ORAL

## 2017-05-07 MED ORDER — HYDROCODONE-ACETAMINOPHEN 5-325 MG PO TABS
1.0000 | ORAL_TABLET | Freq: Once | ORAL | Status: AC
  Administered 2017-05-07: 1 via ORAL
  Filled 2017-05-07: qty 1

## 2017-05-07 MED ORDER — LORAZEPAM 1 MG PO TABS
ORAL_TABLET | ORAL | Status: AC
  Filled 2017-05-07: qty 1

## 2017-05-07 NOTE — ED Notes (Signed)
Patient X-ray att 
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
## 2017-05-07 NOTE — ED Notes (Signed)
Pt laughing with family at this time in the lobby and in NAD.

## 2017-05-07 NOTE — ED Notes (Signed)
Pt reports hx of HTN, family reports noncompliance

## 2017-06-13 ENCOUNTER — Ambulatory Visit
Admission: RE | Admit: 2017-06-13 | Discharge: 2017-06-13 | Disposition: A | Discharge status: Home / Self Care | Payer: Medicaid Other | Source: Ambulatory Visit | Attending: Sports Medicine | Admitting: Sports Medicine

## 2017-06-13 DIAGNOSIS — Q741 Congenital malformation of knee: Secondary | ICD-10-CM | POA: Diagnosis not present

## 2017-06-13 DIAGNOSIS — G8929 Other chronic pain: Secondary | ICD-10-CM | POA: Diagnosis not present

## 2017-06-13 DIAGNOSIS — M25562 Pain in left knee: Secondary | ICD-10-CM | POA: Insufficient documentation

## 2017-06-13 DIAGNOSIS — M25561 Pain in right knee: Secondary | ICD-10-CM | POA: Insufficient documentation

## 2017-08-07 NOTE — Progress Notes (Signed)
 Established Patient Visit   Chief Complaint: Chief Complaint  Patient presents with  . Follow-up    3 mos   Date of Service: 08/07/2017 Date of Birth: 17-Oct-1990 [facility]: [facility]  History of Present Illness: Mr. Edler is a 27 y.o.male who presents for a 3 month follow up of poorly controlled hypertension and cardiomyopathy. Patient states that he only takes his blood pressure medications a couple times a week. He typically forgets or chooses not to take them 3-4 days of the week. He also consumes a diet that is high in salt content, eating mostly fast food on a daily basis. He has cut back his smoking consumption from 1 pack a day to a few cigarettes a month. He denies any chest pain or lower extremity swelling. He has occasional exertional shortness of breath, which is unchanged from his baseline. He also has occasional palpitations which last 2-3 seconds at a time and resolve spontaneously. Today he denies a headache, dizziness, or vision changes.   Past Medical and Surgical History  Past Medical History Past Medical History:  Diagnosis Date  . Cardiomyopathy, secondary (CMS-HCC)   . Hypertension   . Sleep apnea   . Thyroid disease     Past Surgical History He has a past surgical history that includes parathryoid.   Medications and Allergies  Current Medications  Current Outpatient Medications  Medication Sig Dispense Refill  . amLODIPine  (NORVASC ) 10 MG tablet Take 1 tablet (10 mg total) by mouth once daily 90 tablet 2  . carvedilol  (COREG ) 25 MG tablet Take 1 tablet (25 mg total) by mouth 2 (two) times daily with meals 180 tablet 2  . cloNIDine  HCl (CATAPRES ) 0.2 MG tablet Take 1 tablet (0.2 mg total) by mouth 2 (two) times daily 180 tablet 2  . FUROsemide  (LASIX ) 40 MG tablet Take 1 tablet (40 mg total) by mouth once daily 90 tablet 2   No current facility-administered medications for this visit.     Allergies: Patient has no known  allergies.  Social and Family History  Social History  reports that he has been smoking.  He has a 10.00 pack-year smoking history. He has never used smokeless tobacco. He reports that he drinks about 1.2 oz of alcohol per week. He reports that he has current or past drug history.  Family History Family History  Problem Relation Age of Onset  . Kidney disease Mother   . No Known Problems Father   . No Known Problems Sister   . No Known Problems Brother   . No Known Problems Sister   . No Known Problems Brother     Review of Systems   Review of Systems: The patient denies chest pain,  orthopnea, paroxysmal nocturnal dyspnea, pedal edema, heart racing, presyncope, syncope. Admits to occasional palpitations and exertional shortness of breath. Review of 12 Systems is negative except as described above.  Physical Examination   Vitals:BP (!) 180/90   Pulse 72   Ht 170.2 cm (5' 7)   Wt (!) 140.6 kg (310 lb)   BMI 48.55 kg/m  Ht:170.2 cm (5' 7) Wt:(!) 140.6 kg (310 lb) ADJ:Anib surface area is 2.58 meters squared. Body mass index is 48.55 kg/m.  General: Well developed, morbidly obese. In no acute distress HEENT: Pupils equally reactive to light and accomodation  Neck: Supple without thyromegaly, carotid pulses 2+, no carotid bruits present  Lungs: Clear to auscultation bilaterally; no wheezes, rales, rhonchi Heart: Regular rate and rhythm.  No gallops, murmurs or rub Abdomen: Soft nontender, nondistended, with normal bowel sounds Extremities: No cyanosis or clubbing. Mild lower extremity edema.  Peripheral Pulses: 2+ in all extremities, 2+ femoral pulses bilaterally  Assessment   27 y.o. male with  1. Cardiomyopathy, unspecified type (CMS-HCC)   2. Essential hypertension   3. Chronic edema     Echo in 2016: EF reduced at 40-45% with mild LVH. Mild MR   Plan   1. Hypertension, uncontrolled   - Patient noncompliant with medications   - Emphasized the importance of  taking his amlodipine  10mg , carvedilol  25mg , clonidine  .2mg  daily without missing any doses   - Encouraged a diet low in salt; advised him to stay away from fast food   - Encouraged daily exercise, goal of 120 minutes of cardio each week   - Encouraged weight loss using a heart healthy diet and low sodium diet  2. Cardiomyopathy, hypertensive cardiomyopathy   - Strongly recommended compliance with medications   - Emphasized importance of a low sodium diet  3. Chronic edema   - Continue Lasix  40mg    - Continue to keep feet elevated when possible   - Encouraged low sodium diet   No orders of the defined types were placed in this encounter.   Return in about 3 months (around 11/07/2017).    I personally performed the service, non-incident to.  (WP)  NICOLE LYNN STEPHENS, PA

## 2017-08-08 DIAGNOSIS — E210 Primary hyperparathyroidism: Secondary | ICD-10-CM | POA: Insufficient documentation

## 2017-08-08 DIAGNOSIS — Q6589 Other specified congenital deformities of hip: Secondary | ICD-10-CM | POA: Insufficient documentation

## 2017-08-08 DIAGNOSIS — M21061 Valgus deformity, not elsewhere classified, right knee: Secondary | ICD-10-CM | POA: Insufficient documentation

## 2017-09-07 ENCOUNTER — Encounter: Payer: Self-pay | Admitting: Nurse Practitioner

## 2017-09-07 ENCOUNTER — Other Ambulatory Visit: Payer: Self-pay

## 2017-09-07 ENCOUNTER — Ambulatory Visit: Payer: Medicaid Other | Attending: Nurse Practitioner | Admitting: Nurse Practitioner

## 2017-09-07 DIAGNOSIS — M25561 Pain in right knee: Secondary | ICD-10-CM

## 2017-09-07 DIAGNOSIS — F1210 Cannabis abuse, uncomplicated: Secondary | ICD-10-CM | POA: Insufficient documentation

## 2017-09-07 DIAGNOSIS — F329 Major depressive disorder, single episode, unspecified: Secondary | ICD-10-CM | POA: Diagnosis not present

## 2017-09-07 DIAGNOSIS — G894 Chronic pain syndrome: Secondary | ICD-10-CM

## 2017-09-07 DIAGNOSIS — M549 Dorsalgia, unspecified: Secondary | ICD-10-CM

## 2017-09-07 DIAGNOSIS — E876 Hypokalemia: Secondary | ICD-10-CM | POA: Insufficient documentation

## 2017-09-07 DIAGNOSIS — F1190 Opioid use, unspecified, uncomplicated: Secondary | ICD-10-CM

## 2017-09-07 DIAGNOSIS — M546 Pain in thoracic spine: Secondary | ICD-10-CM | POA: Diagnosis not present

## 2017-09-07 DIAGNOSIS — I429 Cardiomyopathy, unspecified: Secondary | ICD-10-CM | POA: Insufficient documentation

## 2017-09-07 DIAGNOSIS — M545 Low back pain: Secondary | ICD-10-CM | POA: Diagnosis not present

## 2017-09-07 DIAGNOSIS — E213 Hyperparathyroidism, unspecified: Secondary | ICD-10-CM | POA: Diagnosis not present

## 2017-09-07 DIAGNOSIS — M79602 Pain in left arm: Secondary | ICD-10-CM

## 2017-09-07 DIAGNOSIS — M25571 Pain in right ankle and joints of right foot: Secondary | ICD-10-CM

## 2017-09-07 DIAGNOSIS — M899 Disorder of bone, unspecified: Secondary | ICD-10-CM

## 2017-09-07 DIAGNOSIS — F1410 Cocaine abuse, uncomplicated: Secondary | ICD-10-CM | POA: Insufficient documentation

## 2017-09-07 DIAGNOSIS — M25512 Pain in left shoulder: Secondary | ICD-10-CM | POA: Diagnosis not present

## 2017-09-07 DIAGNOSIS — M25562 Pain in left knee: Secondary | ICD-10-CM | POA: Diagnosis not present

## 2017-09-07 DIAGNOSIS — M542 Cervicalgia: Secondary | ICD-10-CM

## 2017-09-07 DIAGNOSIS — G4733 Obstructive sleep apnea (adult) (pediatric): Secondary | ICD-10-CM | POA: Diagnosis not present

## 2017-09-07 DIAGNOSIS — G8929 Other chronic pain: Secondary | ICD-10-CM | POA: Insufficient documentation

## 2017-09-07 DIAGNOSIS — Z79899 Other long term (current) drug therapy: Secondary | ICD-10-CM

## 2017-09-07 DIAGNOSIS — I110 Hypertensive heart disease with heart failure: Secondary | ICD-10-CM | POA: Insufficient documentation

## 2017-09-07 DIAGNOSIS — M25511 Pain in right shoulder: Secondary | ICD-10-CM

## 2017-09-07 DIAGNOSIS — Z87891 Personal history of nicotine dependence: Secondary | ICD-10-CM | POA: Insufficient documentation

## 2017-09-07 DIAGNOSIS — Z6841 Body Mass Index (BMI) 40.0 and over, adult: Secondary | ICD-10-CM | POA: Insufficient documentation

## 2017-09-07 DIAGNOSIS — I5022 Chronic systolic (congestive) heart failure: Secondary | ICD-10-CM | POA: Diagnosis not present

## 2017-09-07 DIAGNOSIS — M79601 Pain in right arm: Secondary | ICD-10-CM

## 2017-09-07 DIAGNOSIS — Z789 Other specified health status: Secondary | ICD-10-CM | POA: Insufficient documentation

## 2017-09-07 NOTE — Progress Notes (Signed)
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 for interventional therapies. The decision will be based on the results of diagnostic studies, as well as Terrance Hawkins's risk profile.  Possible procedure(s): Diagnostic bilateral articular knee injections Diagnostic bilateral Hyalgan series Diagnostic right intra-articular ankle injection Diagnostic bilateral lumbar epidural steroid injection Diagnostic bilateral lumbar facet nerve block Possible lumbar facet radiofrequency ablation Diagnostic bilateral cervical epidural steroid injection Diagnostic bilateral intra-articular shoulder injections Diagnostic trigger point injections   Provider-requested follow-up: Return for 2nd Hawkins, w/ Dr. Dossie Arbour.  Future Appointments  Date Time Provider [place]  10/02/2017  9:30 AM Milinda Pointer, MD [facility] None    Primary Care Physician: [facility] [place] Location: [facility] Outpatient Pain Management Facility Note by:  Date: 09/07/2017; Time: 4:47 PM  Pain Score Disclaimer: We use  the NRS-11 scale. This is a self-reported, subjective measurement of pain severity with only modest accuracy. It is used primarily to identify changes within a particular patient. It must be understood that outpatient pain scales are significantly less accurate that those used for research, where they can be applied under ideal controlled circumstances with minimal exposure to variables. In reality, the score is likely to be a combination of pain intensity and pain affect, where pain affect describes the  degree of emotional arousal or changes in action readiness caused by the sensory experience of pain. Factors such as social and work situation, setting, emotional state, anxiety levels, expectation, and prior pain experience may influence pain perception and show large inter-individual differences that may also be affected by time variables.  Patient instructions provided during this appointment: Patient Instructions   _You have some x-rays to get done before next appt. ___________________________________________________________________________________________  Appointment Policy Summary  It is our goal and responsibility to provide the medical community with assistance in the evaluation and management of patients with chronic pain. Unfortunately our resources are limited. Because we do not have an unlimited amount of time, or available appointments, we are required to closely monitor and manage their use. The following rules exist to maximize their use:  Patient's responsibilities: 1. Punctuality:  At what time should I arrive? You should be physically present in our office 30 minutes before your scheduled appointment. Your scheduled appointment is with your assigned healthcare provider. However, it takes 5-10 minutes to be "checked-in", and another 15 minutes for the nurses to do the admission. If you arrive to our office at the time you were given for your appointment, you will end up being at least  20-25 minutes late to your appointment with the provider. 2. Tardiness:  What happens if I arrive only a few minutes after my scheduled appointment time? You will need to reschedule your appointment. The cutoff is your appointment time. This is why it is so important that you arrive at least 30 minutes before that appointment. If you have an appointment scheduled for 10:00 AM and you arrive at 10:01, you will be required to reschedule your appointment.  3. Plan ahead:  Always assume that you will encounter traffic on your way in. Plan for it. If you are dependent on a driver, make sure they understand these rules and the need to arrive early. 4. Other appointments and responsibilities:  Avoid scheduling any other appointments before or after your pain clinic appointments.  5. Be prepared:  Write down everything that you need to discuss with your healthcare provider and give this information to the admitting nurse. Write down the medications that you will need refilled. Bring your pills and bottles (even the empty ones), to all of your appointments, except for those where a procedure is scheduled. 6. No children or pets:  Find someone to take care of them. It is not appropriate to bring them in. 7. Scheduling changes:  We request "advanced notification" of any changes or cancellations. 8. Advanced notification:  Defined as a time period of more than 24 hours prior to the originally scheduled appointment. This allows for the appointment to be offered to other patients. 9. Rescheduling:  When a Hawkins is rescheduled, it will require the cancellation of the original appointment. For this reason they both fall within the category of "Cancellations".  10. Cancellations:  They require advanced notification. Any cancellation less than 24 hours before the  appointment will be recorded as a "No Show". 11. No Show:  Defined as an unkept appointment where the patient failed to notify or declare to the  practice their intention or inability to keep the appointment.  Corrective process for repeat offenders:  1. Tardiness: Three (3) episodes of rescheduling due to late arrivals will be recorded as one (1) "No Show". 2. Cancellation or reschedule: Three (3) cancellations or rescheduling will be recorded as one (1) "No Show". 3. "No Shows": Three (3) "No Shows"  within a 12 month period will result in discharge from the practice. ____________________________________________________________________________________________  ____________________________________________________________________________________________  Pain Scale  Introduction: The pain score used by this practice is the Verbal Numerical Rating Scale (VNRS-11). This is an 11-point scale. It is for adults and children 10 years or older. There are significant differences in how the pain score is reported, used, and applied. Forget everything you learned in the past and learn this scoring system.  General Information: The scale should reflect your current level of pain. Unless you are specifically asked for the level of your worst pain, or your average pain. If you are asked for one of these two, then it should be understood that it is over the past 24 hours.  Basic Activities of Daily Living (ADL): Personal hygiene, dressing, eating, transferring, and using restroom.  Instructions: Most patients tend to report their level of pain as a combination of two factors, their physical pain and their psychosocial pain. This last one is also known as "suffering" and it is reflection of how physical pain affects you socially and psychologically. From now on, report them separately. From this point on, when asked to report your pain level, report only your physical pain. Use the following table for reference.  Pain Clinic Pain Levels (0-5/10)  Pain Level Score  Description  No Pain 0   Mild pain 1 Nagging, annoying, but does not interfere with basic  activities of daily living (ADL). Patients are able to eat, bathe, get dressed, toileting (being able to get on and off the toilet and perform personal hygiene functions), transfer (move in and out of bed or a chair without assistance), and maintain continence (able to control bladder and bowel functions). Blood pressure and heart rate are unaffected. A normal heart rate for a healthy adult ranges from 60 to 100 bpm (beats per minute).   Mild to moderate pain 2 Noticeable and distracting. Impossible to hide from other people. More frequent flare-ups. Still possible to adapt and function close to normal. It can be very annoying and may have occasional stronger flare-ups. With discipline, patients may get used to it and adapt.   Moderate pain 3 Interferes significantly with activities of daily living (ADL). It becomes difficult to feed, bathe, get dressed, get on and off the toilet or to perform personal hygiene functions. Difficult to get in and out of bed or a chair without assistance. Very distracting. With effort, it can be ignored when deeply involved in activities.   Moderately severe pain 4 Impossible to ignore for more than a few minutes. With effort, patients may still be able to manage work or participate in some social activities. Very difficult to concentrate. Signs of autonomic nervous system discharge are evident: dilated pupils (mydriasis); mild sweating (diaphoresis); sleep interference. Heart rate becomes elevated (>115 bpm). Diastolic blood pressure (lower number) rises above 100 mmHg. Patients find relief in laying down and not moving.   Severe pain 5 Intense and extremely unpleasant. Associated with frowning face and frequent crying. Pain overwhelms the senses.  Ability to do any activity or maintain social relationships becomes significantly limited. Conversation becomes difficult. Pacing back and forth is common, as getting into a comfortable position is nearly impossible. Pain wakes you  up from deep sleep. Physical signs will be obvious: pupillary dilation; increased sweating; goosebumps; brisk reflexes; cold, clammy hands and feet; nausea, vomiting or dry heaves; loss of appetite; significant sleep disturbance with inability to fall asleep or to remain asleep. When persistent, significant weight  loss is observed due to the complete loss of appetite and sleep deprivation.  Blood pressure and heart rate becomes significantly elevated. Caution: If elevated blood pressure triggers a pounding headache associated with blurred vision, then the patient should immediately seek attention at an urgent or emergency care unit, as these may be signs of an impending stroke.    Emergency Department Pain Levels (6-10/10)  Emergency Room Pain 6 Severely limiting. Requires emergency care and should not be seen or managed at an outpatient pain management facility. Communication becomes difficult and requires great effort. Assistance to reach the emergency department may be required. Facial flushing and profuse sweating along with potentially dangerous increases in heart rate and blood pressure will be evident.   Distressing pain 7 Self-care is very difficult. Assistance is required to transport, or use restroom. Assistance to reach the emergency department will be required. Tasks requiring coordination, such as bathing and getting dressed become very difficult.   Disabling pain 8 Self-care is no longer possible. At this level, pain is disabling. The individual is unable to do even the most "basic" activities such as walking, eating, bathing, dressing, transferring to a bed, or toileting. Fine motor skills are lost. It is difficult to think clearly.   Incapacitating pain 9 Pain becomes incapacitating. Thought processing is no longer possible. Difficult to remember your own name. Control of movement and coordination are lost.   The worst pain imaginable 10 At this level, most patients pass out from pain.  When this level is reached, collapse of the autonomic nervous system occurs, leading to a sudden drop in blood pressure and heart rate. This in turn results in a temporary and dramatic drop in blood flow to the brain, leading to a loss of consciousness. Fainting is one of the body's self defense mechanisms. Passing out puts the brain in a calmed state and causes it to shut down for a while, in order to begin the healing process.    Summary: 1. Refer to this scale when providing [place] with your pain level. 2. Be accurate and careful when reporting your pain level. This will help with your care. 3. Over-reporting your pain level will lead to loss of credibility. 4. Even a level of 1/10 means that there is pain and will be treated at our facility. 5. High, inaccurate reporting will be documented as "Symptom Exaggeration", leading to loss of credibility and suspicions of possible secondary gains such as obtaining more narcotics, or wanting to appear disabled, for fraudulent reasons. 6. Only pain levels of 5 or below will be seen at our facility. 7. Pain levels of 6 and above will be sent to the Emergency Department and the appointment cancelled. ____________________________________________________________________________________________  BMI Assessment: Estimated body mass index is 47.77 kg/m as calculated from the following:   Height as of 05/06/17: _0  (1.702 m).   Weight as of 05/06/17: 305 lb (138.3 kg).  BMI interpretation table: BMI level Category Range association with higher incidence of chronic pain  <18 kg/m2 Underweight   18.5-24.9 kg/m2 Ideal body weight   25-29.9 kg/m2 Overweight Increased incidence by 20%  30-34.9 kg/m2 Obese (Class I) Increased incidence by 68%  35-39.9 kg/m2 Severe obesity (Class II) Increased incidence by 136%  >40 kg/m2 Extreme obesity (Class III) Increased incidence by 254%   BMI Readings from Last 4 Encounters:  05/06/17 47.77 kg/m  03/20/17 45.58 kg/m   01/05/16 44.64 kg/m  08/11/15 46.99 kg/m   Wt Readings from Last 4 Encounters:  05/06/17 Marland Kitchen)  305 lb (138.3 kg)  03/20/17 291 lb 0.1 oz (132 kg)  01/05/16 285 lb (129.3 kg)  08/11/15 300 lb (136.1 kg)

## 2017-09-07 NOTE — Progress Notes (Signed)
Safety precautions to be maintained throughout the outpatient stay will include: orient to surroundings, keep bed in low position, maintain call bell within reach at all times, provide assistance with transfer out of bed and ambulation.  

## 2017-09-07 NOTE — Patient Instructions (Addendum)
_You have some x-rays to get done before next appt. ___________________________________________________________________________________________  Appointment Policy Summary  It is our goal and responsibility to provide the medical community with assistance in the evaluation and management of patients with chronic pain. Unfortunately our resources are limited. Because we do not have an unlimited amount of time, or available appointments, we are required to closely monitor and manage their use. The following rules exist to maximize their use:  Patient's responsibilities: 1. Punctuality:  At what time should I arrive? You should be physically present in our office 30 minutes before your scheduled appointment. Your scheduled appointment is with your assigned healthcare provider. However, it takes 5-10 minutes to be "checked-in", and another 15 minutes for the nurses to do the admission. If you arrive to our office at the time you were given for your appointment, you will end up being at least 20-25 minutes late to your appointment with the provider. 2. Tardiness:  What happens if I arrive only a few minutes after my scheduled appointment time? You will need to reschedule your appointment. The cutoff is your appointment time. This is why it is so important that you arrive at least 30 minutes before that appointment. If you have an appointment scheduled for 10:00 AM and you arrive at 10:01, you will be required to reschedule your appointment.  3. Plan ahead:  Always assume that you will encounter traffic on your way in. Plan for it. If you are dependent on a driver, make sure they understand these rules and the need to arrive early. 4. Other appointments and responsibilities:  Avoid scheduling any other appointments before or after your pain clinic appointments.  5. Be prepared:  Write down everything that you need to discuss with your healthcare provider and give this information to the admitting nurse.  Write down the medications that you will need refilled. Bring your pills and bottles (even the empty ones), to all of your appointments, except for those where a procedure is scheduled. 6. No children or pets:  Find someone to take care of them. It is not appropriate to bring them in. 7. Scheduling changes:  We request "advanced notification" of any changes or cancellations. 8. Advanced notification:  Defined as a time period of more than 24 hours prior to the originally scheduled appointment. This allows for the appointment to be offered to other patients. 9. Rescheduling:  When a visit is rescheduled, it will require the cancellation of the original appointment. For this reason they both fall within the category of "Cancellations".  10. Cancellations:  They require advanced notification. Any cancellation less than 24 hours before the  appointment will be recorded as a "No Show". 11. No Show:  Defined as an unkept appointment where the patient failed to notify or declare to the practice their intention or inability to keep the appointment.  Corrective process for repeat offenders:  1. Tardiness: Three (3) episodes of rescheduling due to late arrivals will be recorded as one (1) "No Show". 2. Cancellation or reschedule: Three (3) cancellations or rescheduling will be recorded as one (1) "No Show". 3. "No Shows": Three (3) "No Shows" within a 12 month period will result in discharge from the practice. ____________________________________________________________________________________________  ____________________________________________________________________________________________  Pain Scale  Introduction: The pain score used by this practice is the Verbal Numerical Rating Scale (VNRS-11). This is an 11-point scale. It is for adults and children 10 years or older. There are significant differences in how the pain score is reported, used, and  applied. Forget everything you learned in the  past and learn this scoring system.  General Information: The scale should reflect your current level of pain. Unless you are specifically asked for the level of your worst pain, or your average pain. If you are asked for one of these two, then it should be understood that it is over the past 24 hours.  Basic Activities of Daily Living (ADL): Personal hygiene, dressing, eating, transferring, and using restroom.  Instructions: Most patients tend to report their level of pain as a combination of two factors, their physical pain and their psychosocial pain. This last one is also known as "suffering" and it is reflection of how physical pain affects you socially and psychologically. From now on, report them separately. From this point on, when asked to report your pain level, report only your physical pain. Use the following table for reference.  Pain Clinic Pain Levels (0-5/10)  Pain Level Score  Description  No Pain 0   Mild pain 1 Nagging, annoying, but does not interfere with basic activities of daily living (ADL). Patients are able to eat, bathe, get dressed, toileting (being able to get on and off the toilet and perform personal hygiene functions), transfer (move in and out of bed or a chair without assistance), and maintain continence (able to control bladder and bowel functions). Blood pressure and heart rate are unaffected. A normal heart rate for a healthy adult ranges from 60 to 100 bpm (beats per minute).   Mild to moderate pain 2 Noticeable and distracting. Impossible to hide from other people. More frequent flare-ups. Still possible to adapt and function close to normal. It can be very annoying and may have occasional stronger flare-ups. With discipline, patients may get used to it and adapt.   Moderate pain 3 Interferes significantly with activities of daily living (ADL). It becomes difficult to feed, bathe, get dressed, get on and off the toilet or to perform personal hygiene functions.  Difficult to get in and out of bed or a chair without assistance. Very distracting. With effort, it can be ignored when deeply involved in activities.   Moderately severe pain 4 Impossible to ignore for more than a few minutes. With effort, patients may still be able to manage work or participate in some social activities. Very difficult to concentrate. Signs of autonomic nervous system discharge are evident: dilated pupils (mydriasis); mild sweating (diaphoresis); sleep interference. Heart rate becomes elevated (>115 bpm). Diastolic blood pressure (lower number) rises above 100 mmHg. Patients find relief in laying down and not moving.   Severe pain 5 Intense and extremely unpleasant. Associated with frowning face and frequent crying. Pain overwhelms the senses.  Ability to do any activity or maintain social relationships becomes significantly limited. Conversation becomes difficult. Pacing back and forth is common, as getting into a comfortable position is nearly impossible. Pain wakes you up from deep sleep. Physical signs will be obvious: pupillary dilation; increased sweating; goosebumps; brisk reflexes; cold, clammy hands and feet; nausea, vomiting or dry heaves; loss of appetite; significant sleep disturbance with inability to fall asleep or to remain asleep. When persistent, significant weight loss is observed due to the complete loss of appetite and sleep deprivation.  Blood pressure and heart rate becomes significantly elevated. Caution: If elevated blood pressure triggers a pounding headache associated with blurred vision, then the patient should immediately seek attention at an urgent or emergency care unit, as these may be signs of an impending stroke.    Emergency  Department Pain Levels (6-10/10)  Emergency Room Pain 6 Severely limiting. Requires emergency care and should not be seen or managed at an outpatient pain management facility. Communication becomes difficult and requires great  effort. Assistance to reach the emergency department may be required. Facial flushing and profuse sweating along with potentially dangerous increases in heart rate and blood pressure will be evident.   Distressing pain 7 Self-care is very difficult. Assistance is required to transport, or use restroom. Assistance to reach the emergency department will be required. Tasks requiring coordination, such as bathing and getting dressed become very difficult.   Disabling pain 8 Self-care is no longer possible. At this level, pain is disabling. The individual is unable to do even the most "basic" activities such as walking, eating, bathing, dressing, transferring to a bed, or toileting. Fine motor skills are lost. It is difficult to think clearly.   Incapacitating pain 9 Pain becomes incapacitating. Thought processing is no longer possible. Difficult to remember your own name. Control of movement and coordination are lost.   The worst pain imaginable 10 At this level, most patients pass out from pain. When this level is reached, collapse of the autonomic nervous system occurs, leading to a sudden drop in blood pressure and heart rate. This in turn results in a temporary and dramatic drop in blood flow to the brain, leading to a loss of consciousness. Fainting is one of the body's self defense mechanisms. Passing out puts the brain in a calmed state and causes it to shut down for a while, in order to begin the healing process.    Summary: 1. Refer to this scale when providing [place] with your pain level. 2. Be accurate and careful when reporting your pain level. This will help with your care. 3. Over-reporting your pain level will lead to loss of credibility. 4. Even a level of 1/10 means that there is pain and will be treated at our facility. 5. High, inaccurate reporting will be documented as "Symptom Exaggeration", leading to loss of credibility and suspicions of possible secondary gains such as obtaining more  narcotics, or wanting to appear disabled, for fraudulent reasons. 6. Only pain levels of 5 or below will be seen at our facility. 7. Pain levels of 6 and above will be sent to the Emergency Department and the appointment cancelled. ____________________________________________________________________________________________  BMI Assessment: Estimated body mass index is 47.77 kg/m as calculated from the following:   Height as of 05/06/17: 5\' 7"  (1.702 m).   Weight as of 05/06/17: 305 lb (138.3 kg).  BMI interpretation table: BMI level Category Range association with higher incidence of chronic pain  <18 kg/m2 Underweight   18.5-24.9 kg/m2 Ideal body weight   25-29.9 kg/m2 Overweight Increased incidence by 20%  30-34.9 kg/m2 Obese (Class I) Increased incidence by 68%  35-39.9 kg/m2 Severe obesity (Class II) Increased incidence by 136%  >40 kg/m2 Extreme obesity (Class III) Increased incidence by 254%   BMI Readings from Last 4 Encounters:  05/06/17 47.77 kg/m  03/20/17 45.58 kg/m  01/05/16 44.64 kg/m  08/11/15 46.99 kg/m   Wt Readings from Last 4 Encounters:  05/06/17 (!) 305 lb (138.3 kg)  03/20/17 291 lb 0.1 oz (132 kg)  01/05/16 285 lb (129.3 kg)  08/11/15 300 lb (136.1 kg)

## 2017-09-12 ENCOUNTER — Encounter: Payer: Self-pay | Admitting: Nurse Practitioner

## 2017-09-12 DIAGNOSIS — E559 Vitamin D deficiency, unspecified: Secondary | ICD-10-CM | POA: Insufficient documentation

## 2017-09-12 DIAGNOSIS — R700 Elevated erythrocyte sedimentation rate: Secondary | ICD-10-CM | POA: Insufficient documentation

## 2017-09-12 LAB — COMP. METABOLIC PANEL (12)
ALBUMIN: 4.3 g/dL (ref 3.5–5.5)
ALK PHOS: 58 IU/L (ref 39–117)
AST: 12 IU/L (ref 0–40)
Albumin/Globulin Ratio: 1.5 (ref 1.2–2.2)
BILIRUBIN TOTAL: 0.3 mg/dL (ref 0.0–1.2)
BUN/Creatinine Ratio: 11 (ref 9–20)
BUN: 9 mg/dL (ref 6–20)
CHLORIDE: 102 mmol/L (ref 96–106)
Calcium: 9.6 mg/dL (ref 8.7–10.2)
Creatinine, Ser: 0.82 mg/dL (ref 0.76–1.27)
GFR calc Af Amer: 139 mL/min/{1.73_m2} (ref >59)
GFR calc non Af Amer: 120 mL/min/{1.73_m2} (ref >59)
GLOBULIN, TOTAL: 2.8 g/dL (ref 1.5–4.5)
Glucose: 105 mg/dL — ABNORMAL HIGH (ref 65–99)
POTASSIUM: 3.9 mmol/L (ref 3.5–5.2)
Sodium: 143 mmol/L (ref 134–144)
Total Protein: 7.1 g/dL (ref 6.0–8.5)

## 2017-09-12 LAB — 25-HYDROXY VITAMIN D LCMS D2+D3
25-Hydroxy, Vitamin D-2: <1 ng/mL
25-Hydroxy, Vitamin D-3: 23 ng/mL
25-Hydroxy, Vitamin D: 23 ng/mL — ABNORMAL LOW

## 2017-09-12 LAB — VITAMIN B12: VITAMIN B 12: 621 pg/mL (ref 232–1245)

## 2017-09-12 LAB — SEDIMENTATION RATE: Sed Rate: 16 mm/hr — ABNORMAL HIGH (ref 0–15)

## 2017-09-12 LAB — MAGNESIUM: MAGNESIUM: 2 mg/dL (ref 1.6–2.3)

## 2017-09-12 LAB — C-REACTIVE PROTEIN: CRP: 6 mg/L (ref 0–10)

## 2017-09-13 ENCOUNTER — Encounter: Payer: Self-pay | Admitting: Nurse Practitioner

## 2017-09-13 DIAGNOSIS — F1490 Cocaine use, unspecified, uncomplicated: Secondary | ICD-10-CM | POA: Insufficient documentation

## 2017-09-13 DIAGNOSIS — F1290 Cannabis use, unspecified, uncomplicated: Secondary | ICD-10-CM | POA: Insufficient documentation

## 2017-09-13 LAB — COMPLIANCE DRUG ANALYSIS, UR

## 2017-10-01 NOTE — Progress Notes (Deleted)
No show

## 2017-10-02 ENCOUNTER — Ambulatory Visit: Payer: Medicaid Other | Attending: Pain Medicine | Admitting: Pain Medicine

## 2017-10-02 DIAGNOSIS — F1990 Other psychoactive substance use, unspecified, uncomplicated: Secondary | ICD-10-CM | POA: Insufficient documentation

## 2017-10-02 DIAGNOSIS — M79601 Pain in right arm: Secondary | ICD-10-CM

## 2017-10-02 DIAGNOSIS — G8929 Other chronic pain: Secondary | ICD-10-CM | POA: Insufficient documentation

## 2017-10-02 DIAGNOSIS — M79602 Pain in left arm: Secondary | ICD-10-CM

## 2017-10-02 DIAGNOSIS — M1711 Unilateral primary osteoarthritis, right knee: Secondary | ICD-10-CM | POA: Insufficient documentation

## 2017-10-02 DIAGNOSIS — Q741 Congenital malformation of knee: Secondary | ICD-10-CM | POA: Insufficient documentation

## 2017-10-26 NOTE — Progress Notes (Signed)
 [facility] Genetic Testing Results Note  I spoke with Terrance Hawkins via phone on 10/23/2017 to discuss the results of his [facility] Hyperparathyroidism panel  genetic testing.  Discussion of Results: The results of the testing were negative.  To the best of the testing ability, he is not a carrier of a clinically actionable mutation in the AP2S1, CASR, CDC73, CDKN1B, GNA11, MEN1 and RET genes. No uncertain variants were found.These results are uninformative and do not lower the risk for cancer in the patient or in family members, nor do they justify altering the patient's plan for future cancer risk management.   We discussed multiple reasons why the test results could be negative, including false negative results or a non-hereditary cause of cancer (e.g., environmental).  It is also possible that another hereditary cancer syndrome explains the cancer, that a different inherited syndrome explains Terrance Hawkins's history, or that his hyperparathyroidism is caused by a mutation or gene that has yet to be discovered.  Management of Cancer Risks:   No changes to Terrance Hawkins's medical management are recommended at this time. We discussed the benefits and limitations of cancer risk management strategies for individuals at population risk of various cancers (as recommended by the American Cancer Society).   Additionally, genetic testing in other family members for the genes listed above is not recommended at this time.   Impression/Plan: Terrance Hawkins has now tested negative for a clinically actionable mutation in the AP2S1, CASR, CDC73, CDKN1B, GNA11, MEN1 and RET genes via next-generation sequencing and large rearrangement analysis. I will send him a copy of his test report and cancer family history. A copy of the test report will be available shortly and can be found in the labs tab under the name LAB RESULT. He is aware that he is welcome to contact me with any questions or updates on the history of  cancer in the family.  Terrance Monte, MS, [facility] Certified Genetic Counselor  [facility]

## 2019-04-12 IMAGING — CR DG CHEST 2V
2 series · 2 of 2 positions shown · non-contrast
Comparison: 01/05/2016 and prior radiograph

CLINICAL DATA: Shortness of breath for several weeks.

EXAM:
CHEST  2 VIEW

[chest pa]
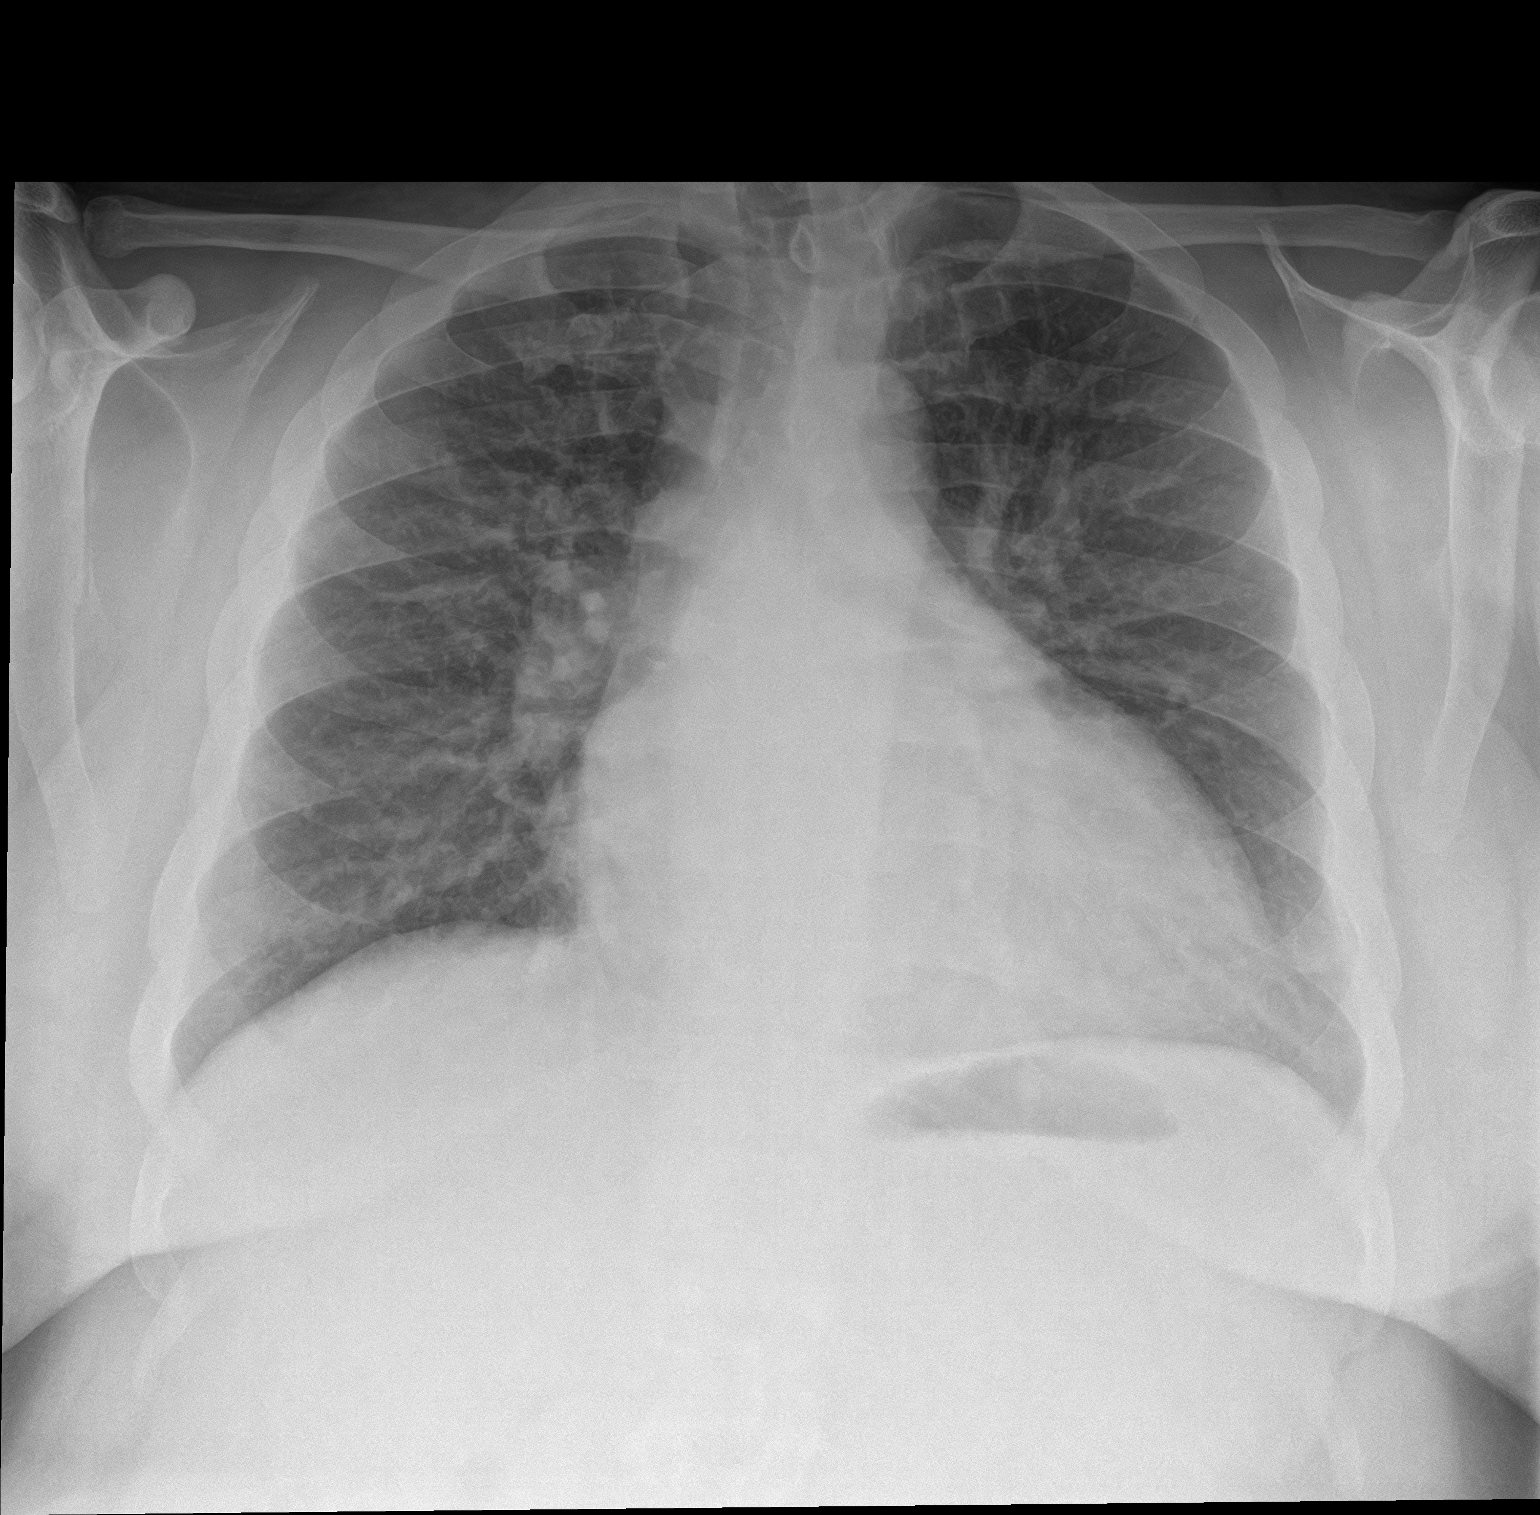

[chest lat]
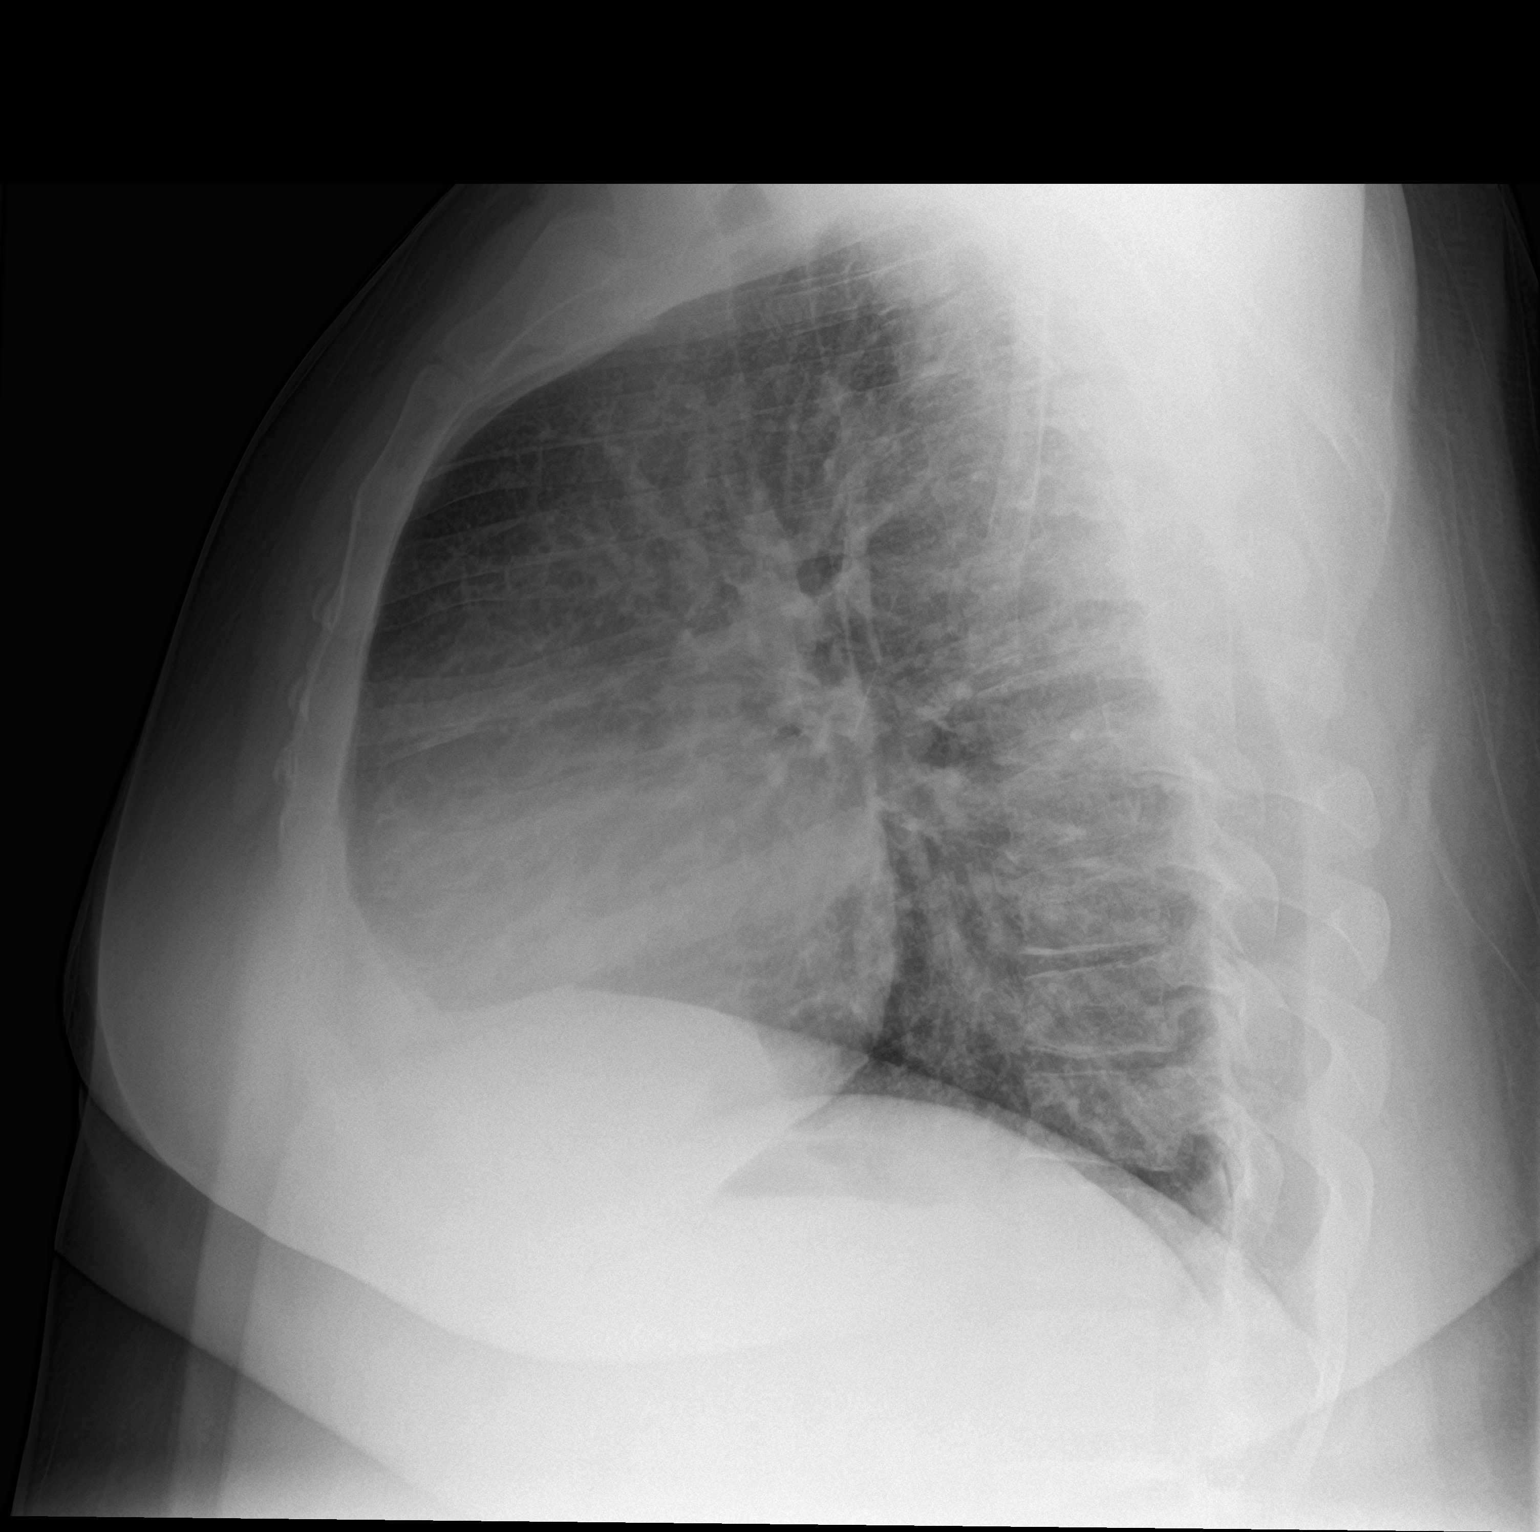

[2 of 2 positions shown; findings below may reference images not displayed]

FINDINGS: Cardiomegaly and pulmonary vascular congestion noted.

There is no evidence of focal airspace disease, pulmonary edema,
suspicious pulmonary nodule/mass, pleural effusion, or pneumothorax.

No acute bony abnormalities are identified.
IMPRESSION: Cardiomegaly with pulmonary vascular congestion.

## 2019-07-30 ENCOUNTER — Encounter: Payer: Self-pay | Admitting: Podiatry

## 2019-07-30 ENCOUNTER — Ambulatory Visit: Payer: Medicaid Other | Admitting: Podiatry

## 2019-07-30 ENCOUNTER — Other Ambulatory Visit: Payer: Self-pay

## 2019-07-30 DIAGNOSIS — L600 Ingrowing nail: Secondary | ICD-10-CM

## 2019-07-30 MED ORDER — GENTAMICIN SULFATE 0.1 % EX CREA: 1.0000 "application " | TOPICAL_CREAM | Freq: Two times a day (BID) | CUTANEOUS | 1 refills | Status: DC

## 2019-07-30 NOTE — Patient Instructions (Signed)

## 2019-07-30 NOTE — Progress Notes (Signed)
   Subjective: Patient presents today for evaluation of pain to the medial border of the bilateral great toes. Patient is concerned for possible ingrown nail.  Pain is been present for several years off and on now.  Patient states that normally he tries to trim his nails out on his own.  Patient presents today for further treatment and evaluation.  Past Medical History:  Diagnosis Date  . CHF (congestive heart failure) (HCC)   . Hypertension     Objective:  General: Well developed, nourished, in no acute distress, alert and oriented x3   Dermatology: Skin is warm, dry and supple bilateral.  Medial border bilateral great toes appears to be erythematous with evidence of an ingrowing nail. Pain on palpation noted to the border of the nail fold. The remaining nails appear unremarkable at this time. There are no open sores, lesions.  Vascular: Dorsalis Pedis artery and Posterior Tibial artery pedal pulses palpable. No lower extremity edema noted.   Neruologic: Grossly intact via light touch bilateral.  Musculoskeletal: Muscular strength within normal limits in all groups bilateral. Normal range of motion noted to all pedal and ankle joints.   Assesement: #1 Paronychia with ingrowing nail medial border bilateral great toes #2 Pain in toe #3 Incurvated nail  Plan of Care:  1. Patient evaluated.  2. Discussed treatment alternatives and plan of care. Explained nail avulsion procedure and post procedure course to patient. 3. Patient opted for permanent partial nail avulsion of the medial border bilateral great toes.  4. Prior to procedure, local anesthesia infiltration utilized using 3 ml of a 50:50 mixture of 2% plain lidocaine and 0.5% plain marcaine in a normal hallux block fashion and a betadine prep performed.  5. Partial permanent nail avulsion with chemical matrixectomy performed using 3x30sec applications of phenol followed by alcohol flush.  6. Light dressing applied. 7.  Prescription  for gentamicin cream  8.  Return to clinic 2 weeks.  Felecia Shelling, DPM [facility]  Dr. Felecia Shelling, DPM    [address]                                        [place], [place] [address]                Office [phone]  Fax [phone]

## 2019-08-13 ENCOUNTER — Ambulatory Visit: Payer: Medicaid Other | Admitting: Podiatry

## 2020-12-09 ENCOUNTER — Ambulatory Visit: Payer: Medicaid Other

## 2020-12-22 ENCOUNTER — Ambulatory Visit: Payer: Medicaid Other | Attending: Neurology

## 2020-12-22 DIAGNOSIS — G4733 Obstructive sleep apnea (adult) (pediatric): Secondary | ICD-10-CM | POA: Diagnosis not present

## 2020-12-22 DIAGNOSIS — Z6841 Body Mass Index (BMI) 40.0 and over, adult: Secondary | ICD-10-CM | POA: Insufficient documentation

## 2020-12-22 DIAGNOSIS — I10 Essential (primary) hypertension: Secondary | ICD-10-CM | POA: Diagnosis present

## 2020-12-23 ENCOUNTER — Other Ambulatory Visit: Payer: Self-pay

## 2021-10-26 DIAGNOSIS — I509 Heart failure, unspecified: Secondary | ICD-10-CM | POA: Diagnosis not present

## 2021-10-26 DIAGNOSIS — I10 Essential (primary) hypertension: Secondary | ICD-10-CM | POA: Diagnosis not present

## 2021-11-23 DIAGNOSIS — E559 Vitamin D deficiency, unspecified: Secondary | ICD-10-CM | POA: Diagnosis not present

## 2021-11-23 DIAGNOSIS — I10 Essential (primary) hypertension: Secondary | ICD-10-CM | POA: Diagnosis not present

## 2021-11-23 DIAGNOSIS — I509 Heart failure, unspecified: Secondary | ICD-10-CM | POA: Diagnosis not present

## 2021-11-23 DIAGNOSIS — G4733 Obstructive sleep apnea (adult) (pediatric): Secondary | ICD-10-CM | POA: Diagnosis not present

## 2022-08-09 ENCOUNTER — Other Ambulatory Visit: Payer: Self-pay | Admitting: Nurse Practitioner

## 2022-08-09 DIAGNOSIS — M79672 Pain in left foot: Secondary | ICD-10-CM

## 2022-08-09 DIAGNOSIS — M545 Low back pain, unspecified: Secondary | ICD-10-CM

## 2022-08-20 ENCOUNTER — Other Ambulatory Visit: Payer: Medicaid Other

## 2022-09-22 ENCOUNTER — Other Ambulatory Visit: Payer: Medicaid Other

## 2022-10-18 ENCOUNTER — Emergency Department
Admission: EM | Admit: 2022-10-18 | Discharge: 2022-10-18 | Disposition: A | Discharge status: Home / Self Care | Payer: Medicaid Other | Attending: Emergency Medicine | Admitting: Emergency Medicine

## 2022-10-18 ENCOUNTER — Emergency Department: Payer: Medicaid Other

## 2022-10-18 DIAGNOSIS — I509 Heart failure, unspecified: Secondary | ICD-10-CM | POA: Diagnosis not present

## 2022-10-18 DIAGNOSIS — D2931 Benign neoplasm of right epididymis: Secondary | ICD-10-CM | POA: Diagnosis not present

## 2022-10-18 DIAGNOSIS — I110 Hypertensive heart disease with heart failure: Secondary | ICD-10-CM | POA: Insufficient documentation

## 2022-10-18 DIAGNOSIS — N50811 Right testicular pain: Secondary | ICD-10-CM | POA: Diagnosis present

## 2022-10-18 LAB — URINALYSIS, ROUTINE W REFLEX MICROSCOPIC
Bilirubin Urine: NEGATIVE
Glucose, UA: NEGATIVE mg/dL
Ketones, ur: NEGATIVE mg/dL
Leukocytes,Ua: NEGATIVE
Nitrite: NEGATIVE
Protein, ur: NEGATIVE mg/dL
Specific Gravity, Urine: 1.011 (ref 1.005–1.030)
pH: 6 (ref 5.0–8.0)

## 2022-10-18 MED ORDER — HYDROCODONE-ACETAMINOPHEN 5-325 MG PO TABS: 1.0000 | ORAL_TABLET | ORAL | 0 refills | Status: DC | PRN

## 2022-10-18 MED ORDER — KETOROLAC TROMETHAMINE 60 MG/2ML IM SOLN
60.0000 mg | Freq: Once | INTRAMUSCULAR | Status: AC
  Administered 2022-10-18: 60 mg via INTRAMUSCULAR
  Filled 2022-10-18: qty 2

## 2022-10-18 NOTE — ED Notes (Signed)
See triage notes. Patient stated he has had testicle pain since Thursday. Patient denies injury, urinary symptoms, smell or discharge.

## 2022-10-18 NOTE — ED Triage Notes (Signed)
Pt c/o R testicle pain since Thursday. Pt denies known injury. No urinary symptoms, penile discharge, odor. States "I can feel something there that isn't supposed to be".

## 2022-10-18 NOTE — Discharge Instructions (Addendum)
Please use compressive underwear for the next 6 months or until symptoms resolve. Please follow-up with the listed Urologist if the pain persists

## 2022-10-18 NOTE — ED Provider Notes (Signed)
[facility] Provider Note   Event Date/Time   First MD Initiated Contact with Patient 10/18/22 1215     (approximate) History  Testicle Pain  HPI Terrance Hawkins is a 32 y.o. male with stated past medical history of hypertension and CHF who presents complaining of 3 days of right testicle pain.  Patient states he "can feel something" on his right testicle but states that has not been increasing in size.  Patient denies any fever/chills.  Patient denies any recent travel or sick contacts.  Patient denies any new unprotected sexual contacts.  Patient denies any abnormal discharge from the penis or rectum ROS: Patient currently denies any vision changes, tinnitus, difficulty speaking, facial droop, sore throat, chest pain, shortness of breath, abdominal pain, nausea/vomiting/diarrhea, dysuria, or weakness/numbness/paresthesias in any extremity   Physical Exam  Triage Vital Signs: ED Triage Vitals  Encounter Vitals Group     BP 10/18/22 1158 (!) 189/135     Systolic BP Percentile --      Diastolic BP Percentile --      Pulse Rate 10/18/22 1158 71     Resp 10/18/22 1158 16     Temp 10/18/22 1158 98.8 F (37.1 C)     Temp Source 10/18/22 1158 Oral     SpO2 10/18/22 1158 97 %     Weight 10/18/22 1159 292 lb (132.5 kg)     Height 10/18/22 1159 5\' 7"  (1.702 m)     Head Circumference --      Peak Flow --      Pain Score 10/18/22 1159 10     Pain Loc --      Pain Education --      Exclude from Growth Chart --    Most recent vital signs: Vitals:   10/18/22 1158  BP: (!) 189/135  Pulse: 71  Resp: 16  Temp: 98.8 F (37.1 C)  SpO2: 97%   General: Awake, oriented x4. CV:  Good peripheral perfusion.  Resp:  Normal effort.  Abd:  No distention.  Genitourinary: Normal external circumcised male genitalia without lesions or rashes.  Right testicle in normal lie and without significant tenderness to palpation.  Mild tenderness palpation over the epididymis.   Normal left testicle Other:  Middle-aged morbidly obese African-American male sitting on chair in room in obvious distress secondary to pain ED Results / Procedures / Treatments  Labs (all labs ordered are listed, but only abnormal results are displayed) Labs Reviewed  URINALYSIS, ROUTINE W REFLEX MICROSCOPIC - Abnormal; Notable for the following components:      Result Value   Color, Urine YELLOW (*)    APPearance HAZY (*)    Hgb urine dipstick SMALL (*)    Bacteria, UA RARE (*)    All other components within normal limits   RADIOLOGY ED MD interpretation: Doppler ultrasound of the scrotum shows no evidence of testicular torsion with a right epididymal head cyst -Agree with radiology assessment Official radiology report(s): US SCROTUM W/DOPPLER  Result Date: 10/18/2022 CLINICAL DATA:  right testicular pain x3 days, Pt states can feel small mass on right testicle. Nothing appreciated on exam. EXAM: SCROTAL ULTRASOUND DOPPLER ULTRASOUND OF THE TESTICLES TECHNIQUE: Complete ultrasound examination of the testicles, epididymis, and other scrotal structures was performed. Color and spectral Doppler ultrasound were also utilized to evaluate blood flow to the testicles. COMPARISON:  None Available. FINDINGS: Right testicle Measurements: 2.0 x 2.8 x 4.5 cm. No mass or microlithiasis visualized. Left testicle Measurements: 2.0  x 3.1 x 4.2 cm. No mass or microlithiasis visualized. Right epididymis: Note is made of a 8 x 16 x 19 mm simple cyst in the epididymal head. Otherwise unremarkable. Left epididymis:  Normal in size and appearance. Hydrocele:  None visualized. Varicocele:  None visualized. Pulsed Doppler interrogation of both testes demonstrates normal low resistance arterial and venous waveforms bilaterally. IMPRESSION: 1. No evidence of testicular torsion. 2. Right epididymal head cyst. Electronically Signed   By: Jules Schick M.D.   On: 10/18/2022 14:03   PROCEDURES: Critical Care performed:  No Procedures MEDICATIONS ORDERED IN ED: Medications  ketorolac (TORADOL) injection 60 mg (60 mg Intramuscular Given 10/18/22 1303)   IMPRESSION / MDM / ASSESSMENT AND PLAN / ED COURSE  I reviewed the triage vital signs and the nursing notes.                             The patient is on the cardiac monitor to evaluate for evidence of arrhythmia and/or significant heart rate changes. Patient's presentation is most consistent with acute presentation with potential threat to life or bodily function. The patient is suffering from testicular pain, but based on the history, exam, and testing, I do not suspect that the patient has testicular torsion, abscess, severe cellulitis, Fournier's gangrene, or other emergent cause.  UA unremarkable US Scrotum: Right epididymal cyst  Disposition: Plan follow up with primary care doctor for symptom re-check and possible referral to urology. Discussed return precautions at bedside. Discharge.   FINAL CLINICAL IMPRESSION(S) / ED DIAGNOSES   Final diagnoses:  Benign neoplasm of right epididymis   Rx / DC Orders   ED Discharge Orders          Ordered    HYDROcodone-acetaminophen (NORCO) 5-325 MG tablet  Every 4 hours PRN        10/18/22 1426           Note:  This document was prepared using Dragon voice recognition software and may include unintentional dictation errors.   Merwyn Katos, MD 10/18/22 1520

## 2022-11-04 ENCOUNTER — Encounter: Payer: Self-pay | Admitting: Urology

## 2022-11-04 ENCOUNTER — Ambulatory Visit: Payer: Medicaid Other | Admitting: Urology

## 2022-11-04 VITALS — BP 191/115 | HR 79 | Ht 67.0 in | Wt 300.0 lb

## 2022-11-04 DIAGNOSIS — N5082 Scrotal pain: Secondary | ICD-10-CM | POA: Diagnosis not present

## 2022-11-04 MED ORDER — MELOXICAM 15 MG PO TBDP: 15.0000 mg | ORAL_TABLET | Freq: Every day | ORAL | 0 refills | Status: DC

## 2022-11-04 NOTE — Progress Notes (Signed)
Marcelle Overlie Plume,acting as a scribe for Vanna Scotland, MD.,have documented all relevant documentation on the behalf of Vanna Scotland, MD,as directed by  Vanna Scotland, MD while in the presence of Vanna Scotland, MD.  11/04/2022 12:28 PM   Terrance Hawkins 1990/10/17 [id number]  Referring provider: Oswaldo Conroy, MD [address] [place],  [place] [address]  Chief Complaint  Patient presents with   Cyst    HPI: 32 year-old male who presents today for further evaluation of a right epididymal cyst.   He was seen and evaluated in the emergency room on 10/18/2022 with testicular pain and he noticed a mass. He underwent a testicular ultrasound, which I personally reviewed today, that shows a 19 mm right epididymal head cyst. He had no other testicular pathology, good flow bilaterally, and no evidence of infection.  Today, the pain is described as a tingling sensation and occurs frequently. He denies any trauma to the testicle and reports that the pain started suddenly. The pain sometimes radiates to the lower abdomen. He has not experienced any urinary issues such as dysuria or hematuria. He notes that the pain began a couple weeks ago.    PMH: Past Medical History:  Diagnosis Date   CHF (congestive heart failure) (HCC)    Hypertension     Surgical History: Past Surgical History:  Procedure Laterality Date   THYROID SURGERY      Home Medications:  Allergies as of 11/04/2022   No Known Allergies      Medication List        Accurate as of November 04, 2022 12:28 PM. If you have any questions, ask your nurse or doctor.          STOP taking these medications    gentamicin cream 0.1 % Commonly known as: GARAMYCIN Stopped by: Vanna Scotland       TAKE these medications    amLODipine 10 MG tablet Commonly known as: NORVASC Take 1 tablet (10 mg total) by mouth daily.   carvedilol 25 MG tablet Commonly known as: COREG Take 25 mg by mouth  2 (two) times daily. What changed: Another medication with the same name was removed. Continue taking this medication, and follow the directions you see here. Changed by: Vanna Scotland   cloNIDine 0.2 MG tablet Commonly known as: CATAPRES Take 1 tablet (0.2 mg total) by mouth 2 (two) times daily.   furosemide 40 MG tablet Commonly known as: LASIX Take 1 tablet (40 mg total) by mouth daily.   HYDROcodone-acetaminophen 5-325 MG tablet Commonly known as: Norco Take 1 tablet by mouth every 4 (four) hours as needed for moderate pain.   losartan 100 MG tablet Commonly known as: COZAAR Take 100 mg by mouth daily.   Meloxicam 15 MG Tbdp Take 15 mg by mouth daily. Started by: Vanna Scotland   omeprazole 20 MG capsule Commonly known as: PRILOSEC Take 20 mg by mouth daily.   potassium chloride SA 20 MEQ tablet Commonly known as: KLOR-CON M Take 20 mEq by mouth daily.   spironolactone 25 MG tablet Commonly known as: ALDACTONE Take 25 mg by mouth daily.   venlafaxine XR 75 MG 24 hr capsule Commonly known as: EFFEXOR-XR Take 75 mg by mouth daily.        Family History: Family History  Problem Relation Age of Onset   Diabetes Mother    Kidney failure Mother    Hypertension Father    Diabetes Maternal Grandmother  Breast cancer Paternal Grandmother     Social History:  reports that he has quit smoking. His smoking use included cigarettes. He has never used smokeless tobacco. He reports current alcohol use. He reports current drug use. Frequency: 4.00 times per week. Drugs: Marijuana and Cocaine.   Physical Exam: BP (!) 191/115 (BP Location: Left Arm, Patient Position: Sitting, Cuff Size: Large)   Pulse 79   Ht 5\' 7"  (1.702 m)   Wt 300 lb (136.1 kg)   BMI 46.99 kg/m   Constitutional:  Alert and oriented, No acute distress. HEENT: [place] AT, moist mucus membranes.  Trachea midline, no masses. GU: Right testicular examination reveals a palpable mass consistent with the  ultrasound findings of an epididymal cyst. No tenderness upon palpation, no signs of inflammation or infection. Normal left testicle,.  No obvious inguinal hernias today, minimally tender scrotal exam, nonspecific. Neurologic: Grossly intact, no focal deficits, moving all 4 extremities. Psychiatric: Normal mood and affect.  Pertinent Imaging: EXAM: SCROTAL ULTRASOUND   DOPPLER ULTRASOUND OF THE TESTICLES   TECHNIQUE: Complete ultrasound examination of the testicles, epididymis, and other scrotal structures was performed. Color and spectral Doppler ultrasound were also utilized to evaluate blood flow to the testicles.   COMPARISON:  None Available.   FINDINGS: Right testicle   Measurements: 2.0 x 2.8 x 4.5 cm. No mass or microlithiasis visualized.   Left testicle   Measurements: 2.0 x 3.1 x 4.2 cm. No mass or microlithiasis visualized.   Right epididymis: Note is made of a 8 x 16 x 19 mm simple cyst in the epididymal head. Otherwise unremarkable.   Left epididymis:  Normal in size and appearance.   Hydrocele:  None visualized.   Varicocele:  None visualized.   Pulsed Doppler interrogation of both testes demonstrates normal low resistance arterial and venous waveforms bilaterally.   IMPRESSION: 1. No evidence of testicular torsion. 2. Right epididymal head cyst.     Electronically Signed   By: Jules Schick M.D.   On: 10/18/2022 14:03  This was personally reviewed and I agree with the radiologic interpretation.    Assessment & Plan:    1. Right epididymal head cyst - Likely due to inflammation rather than infection, as there are no signs of infection on examination or ultrasound. - Plan is to manage the inflammation and pain with meloxicam 15 mg daily x 2 weeks and to provide scrotal support with tight underwear to reduce movement and trauma. - Advised to follow up in two weeks to assess the response to treatment. If there is no improvement, consider a course  of antibiotics or surgical intervention if necessary although strongly suspect that epididymis cyst is completely incidental finding. - Can cancel the follow up appointment if symptoms resolve  Return in about 2 weeks (around 11/18/2022).  I have reviewed the above documentation for accuracy and completeness, and I agree with the above.   Vanna Scotland, MD   [facility] [address] [place], [place] [address] [phone]

## 2022-11-11 NOTE — Progress Notes (Deleted)
11/14/2022 9:41 AM   Terrance Hawkins 02/20/1990 [id number]  Referring provider: [facility], [facility] [address] [place],  [place] [address]  Urological history: 1.  Epididymal cyst -Scrotal ultrasound (09/2022) - 19 mm right epididymal cyst   No chief complaint on file.  HPI: Terrance Hawkins is a 32 y.o. male who presents today for follow up.   Previous records reviewed.   He was seen by Dr. Apolinar Junes on November 04, 2018 for for further evaluation of a right epididymal cyst found on scrotal ultrasound when he was seen in the emergency room in September for testicular pain.  He was placed on 2-week course of NSAIDs.  Review of the scrotal ultrasound done on October 18, 2022 does note the 19 mm epididymal cyst.      PMH: Past Medical History:  Diagnosis Date   CHF (congestive heart failure) (HCC)    Hypertension     Surgical History: Past Surgical History:  Procedure Laterality Date   THYROID SURGERY      Home Medications:  Allergies as of 11/14/2022   No Known Allergies      Medication List        Accurate as of November 11, 2022  9:41 AM. If you have any questions, ask your nurse or doctor.          amLODipine 10 MG tablet Commonly known as: NORVASC Take 1 tablet (10 mg total) by mouth daily.   carvedilol 25 MG tablet Commonly known as: COREG Take 25 mg by mouth 2 (two) times daily.   cloNIDine 0.2 MG tablet Commonly known as: CATAPRES Take 1 tablet (0.2 mg total) by mouth 2 (two) times daily.   furosemide 40 MG tablet Commonly known as: LASIX Take 1 tablet (40 mg total) by mouth daily.   HYDROcodone-acetaminophen 5-325 MG tablet Commonly known as: Norco Take 1 tablet by mouth every 4 (four) hours as needed for moderate pain.   losartan 100 MG tablet Commonly known as: COZAAR Take 100 mg by mouth daily.   Meloxicam 15 MG Tbdp Take 15 mg by mouth daily.   omeprazole 20 MG capsule Commonly known  as: PRILOSEC Take 20 mg by mouth daily.   potassium chloride SA 20 MEQ tablet Commonly known as: KLOR-CON M Take 20 mEq by mouth daily.   spironolactone 25 MG tablet Commonly known as: ALDACTONE Take 25 mg by mouth daily.   venlafaxine XR 75 MG 24 hr capsule Commonly known as: EFFEXOR-XR Take 75 mg by mouth daily.        Allergies: No Known Allergies  Family History: Family History  Problem Relation Age of Onset   Diabetes Mother    Kidney failure Mother    Hypertension Father    Diabetes Maternal Grandmother    Breast cancer Paternal Grandmother     Social History:  reports that he has quit smoking. His smoking use included cigarettes. He has never used smokeless tobacco. He reports current alcohol use. He reports current drug use. Frequency: 4.00 times per week. Drugs: Marijuana and Cocaine.  ROS: Pertinent ROS in HPI  Physical Exam: There were no vitals taken for this visit.  Constitutional:  Well nourished. Alert and oriented, No acute distress. HEENT: [place] AT, moist mucus membranes.  Trachea midline, no masses. Cardiovascular: No clubbing, cyanosis, or edema. Respiratory: Normal respiratory effort, no increased work of breathing. GI: Abdomen is soft, non tender, non distended, no abdominal masses. Liver and spleen not palpable.  No hernias appreciated.  Stool sample for occult testing is not indicated.   GU: No CVA tenderness.  No bladder fullness or masses.  Patient with circumcised/uncircumcised phallus. ***Foreskin easily retracted***  Urethral meatus is patent.  No penile discharge. No penile lesions or rashes. Scrotum without lesions, cysts, rashes and/or edema.  Testicles are located scrotally bilaterally. No masses are appreciated in the testicles. Left and right epididymis are normal. Rectal: Patient with  normal sphincter tone. Anus and perineum without scarring or rashes. No rectal masses are appreciated. Prostate is approximately *** grams, *** nodules are  appreciated. Seminal vesicles are normal. Skin: No rashes, bruises or suspicious lesions. Lymph: No cervical or inguinal adenopathy. Neurologic: Grossly intact, no focal deficits, moving all 4 extremities. Psychiatric: Normal mood and affect.  Laboratory Data: N/A   Pertinent Imaging: CLINICAL DATA:  right testicular pain x3 days, Pt states can feel small mass on right testicle. Nothing appreciated on exam.   EXAM: SCROTAL ULTRASOUND   DOPPLER ULTRASOUND OF THE TESTICLES   TECHNIQUE: Complete ultrasound examination of the testicles, epididymis, and other scrotal structures was performed. Color and spectral Doppler ultrasound were also utilized to evaluate blood flow to the testicles.   COMPARISON:  None Available.   FINDINGS: Right testicle   Measurements: 2.0 x 2.8 x 4.5 cm. No mass or microlithiasis visualized.   Left testicle   Measurements: 2.0 x 3.1 x 4.2 cm. No mass or microlithiasis visualized.   Right epididymis: Note is made of a 8 x 16 x 19 mm simple cyst in the epididymal head. Otherwise unremarkable.   Left epididymis:  Normal in size and appearance.   Hydrocele:  None visualized.   Varicocele:  None visualized.   Pulsed Doppler interrogation of both testes demonstrates normal low resistance arterial and venous waveforms bilaterally.   IMPRESSION: 1. No evidence of testicular torsion. 2. Right epididymal head cyst.     Electronically Signed   By: Jules Schick M.D.   On: 10/18/2022 14:03 I have independently reviewed the films.    Assessment & Plan:  ***  1.  Right epididymal cyst -***   No follow-ups on file.  These notes generated with voice recognition software. I apologize for typographical errors.  Cloretta Ned  [facility] [address]  [address] [place], [place] [address] [phone]

## 2022-11-14 ENCOUNTER — Ambulatory Visit: Payer: Medicaid Other | Admitting: Urology

## 2022-11-14 DIAGNOSIS — N5082 Scrotal pain: Secondary | ICD-10-CM

## 2023-10-03 ENCOUNTER — Other Ambulatory Visit: Payer: Self-pay

## 2023-10-03 ENCOUNTER — Emergency Department

## 2023-10-03 ENCOUNTER — Inpatient Hospital Stay
Admission: EM | Admit: 2023-10-03 | Discharge: 2023-10-07 | DRG: 280 | Disposition: A | Discharge status: Home / Self Care | Attending: Internal Medicine | Admitting: Internal Medicine

## 2023-10-03 ENCOUNTER — Inpatient Hospital Stay (HOSPITAL_COMMUNITY)
Admit: 2023-10-03 | Discharge: 2023-10-03 | Disposition: A | Discharge status: Home / Self Care | Attending: Internal Medicine | Admitting: Internal Medicine

## 2023-10-03 ENCOUNTER — Inpatient Hospital Stay: Admit: 2023-10-03

## 2023-10-03 DIAGNOSIS — I2510 Atherosclerotic heart disease of native coronary artery without angina pectoris: Secondary | ICD-10-CM | POA: Diagnosis present

## 2023-10-03 DIAGNOSIS — Z888 Allergy status to other drugs, medicaments and biological substances status: Secondary | ICD-10-CM

## 2023-10-03 DIAGNOSIS — Z6841 Body Mass Index (BMI) 40.0 and over, adult: Secondary | ICD-10-CM | POA: Diagnosis not present

## 2023-10-03 DIAGNOSIS — F1910 Other psychoactive substance abuse, uncomplicated: Secondary | ICD-10-CM | POA: Diagnosis present

## 2023-10-03 DIAGNOSIS — I214 Non-ST elevation (NSTEMI) myocardial infarction: Secondary | ICD-10-CM | POA: Diagnosis present

## 2023-10-03 DIAGNOSIS — I5041 Acute combined systolic (congestive) and diastolic (congestive) heart failure: Secondary | ICD-10-CM | POA: Diagnosis not present

## 2023-10-03 DIAGNOSIS — R0989 Other specified symptoms and signs involving the circulatory and respiratory systems: Secondary | ICD-10-CM | POA: Diagnosis present

## 2023-10-03 DIAGNOSIS — R7401 Elevation of levels of liver transaminase levels: Secondary | ICD-10-CM | POA: Diagnosis present

## 2023-10-03 DIAGNOSIS — Z7984 Long term (current) use of oral hypoglycemic drugs: Secondary | ICD-10-CM | POA: Diagnosis not present

## 2023-10-03 DIAGNOSIS — Z1152 Encounter for screening for COVID-19: Secondary | ICD-10-CM

## 2023-10-03 DIAGNOSIS — Z7902 Long term (current) use of antithrombotics/antiplatelets: Secondary | ICD-10-CM

## 2023-10-03 DIAGNOSIS — F17210 Nicotine dependence, cigarettes, uncomplicated: Secondary | ICD-10-CM | POA: Diagnosis present

## 2023-10-03 DIAGNOSIS — E66813 Obesity, class 3: Secondary | ICD-10-CM | POA: Diagnosis present

## 2023-10-03 DIAGNOSIS — I5043 Acute on chronic combined systolic (congestive) and diastolic (congestive) heart failure: Secondary | ICD-10-CM | POA: Diagnosis present

## 2023-10-03 DIAGNOSIS — Z91148 Patient's other noncompliance with medication regimen for other reason: Secondary | ICD-10-CM

## 2023-10-03 DIAGNOSIS — I255 Ischemic cardiomyopathy: Secondary | ICD-10-CM | POA: Diagnosis present

## 2023-10-03 DIAGNOSIS — I447 Left bundle-branch block, unspecified: Secondary | ICD-10-CM | POA: Diagnosis present

## 2023-10-03 DIAGNOSIS — J810 Acute pulmonary edema: Principal | ICD-10-CM | POA: Diagnosis present

## 2023-10-03 DIAGNOSIS — Z79899 Other long term (current) drug therapy: Secondary | ICD-10-CM | POA: Diagnosis not present

## 2023-10-03 DIAGNOSIS — E876 Hypokalemia: Secondary | ICD-10-CM | POA: Diagnosis present

## 2023-10-03 DIAGNOSIS — Z833 Family history of diabetes mellitus: Secondary | ICD-10-CM | POA: Diagnosis not present

## 2023-10-03 DIAGNOSIS — R7989 Other specified abnormal findings of blood chemistry: Secondary | ICD-10-CM

## 2023-10-03 DIAGNOSIS — I509 Heart failure, unspecified: Secondary | ICD-10-CM | POA: Diagnosis not present

## 2023-10-03 DIAGNOSIS — G4733 Obstructive sleep apnea (adult) (pediatric): Secondary | ICD-10-CM | POA: Diagnosis present

## 2023-10-03 DIAGNOSIS — Z8249 Family history of ischemic heart disease and other diseases of the circulatory system: Secondary | ICD-10-CM | POA: Diagnosis not present

## 2023-10-03 DIAGNOSIS — Z7982 Long term (current) use of aspirin: Secondary | ICD-10-CM

## 2023-10-03 DIAGNOSIS — I252 Old myocardial infarction: Secondary | ICD-10-CM

## 2023-10-03 DIAGNOSIS — I10 Essential (primary) hypertension: Secondary | ICD-10-CM | POA: Diagnosis not present

## 2023-10-03 DIAGNOSIS — Z803 Family history of malignant neoplasm of breast: Secondary | ICD-10-CM | POA: Diagnosis not present

## 2023-10-03 DIAGNOSIS — I340 Nonrheumatic mitral (valve) insufficiency: Secondary | ICD-10-CM | POA: Diagnosis not present

## 2023-10-03 DIAGNOSIS — R0602 Shortness of breath: Secondary | ICD-10-CM

## 2023-10-03 DIAGNOSIS — D72829 Elevated white blood cell count, unspecified: Secondary | ICD-10-CM | POA: Diagnosis present

## 2023-10-03 DIAGNOSIS — R0603 Acute respiratory distress: Secondary | ICD-10-CM | POA: Diagnosis not present

## 2023-10-03 DIAGNOSIS — R0902 Hypoxemia: Secondary | ICD-10-CM | POA: Diagnosis present

## 2023-10-03 DIAGNOSIS — F1290 Cannabis use, unspecified, uncomplicated: Secondary | ICD-10-CM | POA: Diagnosis present

## 2023-10-03 DIAGNOSIS — E213 Hyperparathyroidism, unspecified: Secondary | ICD-10-CM | POA: Diagnosis present

## 2023-10-03 DIAGNOSIS — I161 Hypertensive emergency: Secondary | ICD-10-CM | POA: Diagnosis present

## 2023-10-03 DIAGNOSIS — F1490 Cocaine use, unspecified, uncomplicated: Secondary | ICD-10-CM | POA: Diagnosis present

## 2023-10-03 DIAGNOSIS — I110 Hypertensive heart disease with heart failure: Secondary | ICD-10-CM | POA: Diagnosis present

## 2023-10-03 DIAGNOSIS — Z841 Family history of disorders of kidney and ureter: Secondary | ICD-10-CM | POA: Diagnosis not present

## 2023-10-03 DIAGNOSIS — I420 Dilated cardiomyopathy: Secondary | ICD-10-CM | POA: Diagnosis not present

## 2023-10-03 DIAGNOSIS — E785 Hyperlipidemia, unspecified: Secondary | ICD-10-CM | POA: Diagnosis not present

## 2023-10-03 DIAGNOSIS — I5021 Acute systolic (congestive) heart failure: Secondary | ICD-10-CM

## 2023-10-03 DIAGNOSIS — I5023 Acute on chronic systolic (congestive) heart failure: Secondary | ICD-10-CM | POA: Diagnosis present

## 2023-10-03 LAB — COMPREHENSIVE METABOLIC PANEL WITH GFR
ALT: 45 U/L — ABNORMAL HIGH (ref 0–44)
AST: 91 U/L — ABNORMAL HIGH (ref 15–41)
Albumin: 3.6 g/dL (ref 3.5–5.0)
Alkaline Phosphatase: 36 U/L — ABNORMAL LOW (ref 38–126)
Anion gap: 12 (ref 5–15)
BUN: 10 mg/dL (ref 6–20)
CO2: 21 mmol/L — ABNORMAL LOW (ref 22–32)
Calcium: 8.7 mg/dL — ABNORMAL LOW (ref 8.9–10.3)
Chloride: 104 mmol/L (ref 98–111)
Creatinine, Ser: 0.87 mg/dL (ref 0.61–1.24)
GFR, Estimated: >60 mL/min (ref >60)
Glucose, Bld: 104 mg/dL — ABNORMAL HIGH (ref 70–99)
Potassium: 3.3 mmol/L — ABNORMAL LOW (ref 3.5–5.1)
Sodium: 137 mmol/L (ref 135–145)
Total Bilirubin: 0.9 mg/dL (ref 0.0–1.2)
Total Protein: 6.5 g/dL (ref 6.5–8.1)

## 2023-10-03 LAB — URINE DRUG SCREEN, QUALITATIVE (ARMC ONLY)
Amphetamines, Ur Screen: NOT DETECTED
Barbiturates, Ur Screen: NOT DETECTED
Benzodiazepine, Ur Scrn: NOT DETECTED
Cannabinoid 50 Ng, Ur ~~LOC~~: POSITIVE — AB
Cocaine Metabolite,Ur ~~LOC~~: POSITIVE — AB
MDMA (Ecstasy)Ur Screen: NOT DETECTED
Methadone Scn, Ur: NOT DETECTED
Opiate, Ur Screen: NOT DETECTED
Phencyclidine (PCP) Ur S: NOT DETECTED
Tricyclic, Ur Screen: NOT DETECTED

## 2023-10-03 LAB — CBC WITH DIFFERENTIAL/PLATELET
Abs Immature Granulocytes: 0.04 K/uL (ref 0.00–0.07)
Basophils Absolute: 0 K/uL (ref 0.0–0.1)
Basophils Relative: 0 %
Eosinophils Absolute: 0.1 K/uL (ref 0.0–0.5)
Eosinophils Relative: 0 %
HCT: 46.5 % (ref 39.0–52.0)
Hemoglobin: 15.7 g/dL (ref 13.0–17.0)
Immature Granulocytes: 0 %
Lymphocytes Relative: 21 %
Lymphs Abs: 2.5 K/uL (ref 0.7–4.0)
MCH: 30.9 pg (ref 26.0–34.0)
MCHC: 33.8 g/dL (ref 30.0–36.0)
MCV: 91.5 fL (ref 80.0–100.0)
Monocytes Absolute: 1.4 K/uL — ABNORMAL HIGH (ref 0.1–1.0)
Monocytes Relative: 11 %
Neutro Abs: 8.1 K/uL — ABNORMAL HIGH (ref 1.7–7.7)
Neutrophils Relative %: 68 %
Platelets: 156 K/uL (ref 150–400)
RBC: 5.08 MIL/uL (ref 4.22–5.81)
RDW: 13.4 % (ref 11.5–15.5)
WBC: 12 K/uL — ABNORMAL HIGH (ref 4.0–10.5)
nRBC: 0 % (ref 0.0–0.2)

## 2023-10-03 LAB — MAGNESIUM: Magnesium: 1.8 mg/dL (ref 1.7–2.4)

## 2023-10-03 LAB — RESP PANEL BY RT-PCR (RSV, FLU A&B, COVID)  RVPGX2
Influenza A by PCR: NEGATIVE
Influenza B by PCR: NEGATIVE
Resp Syncytial Virus by PCR: NEGATIVE
SARS Coronavirus 2 by RT PCR: NEGATIVE

## 2023-10-03 LAB — TROPONIN I (HIGH SENSITIVITY)
Troponin I (High Sensitivity): 15417 ng/L (ref <18)
Troponin I (High Sensitivity): 14248 ng/L (ref <18)
Troponin I (High Sensitivity): 14004 ng/L (ref <18)

## 2023-10-03 LAB — CK: Total CK: 301 U/L (ref 49–397)

## 2023-10-03 LAB — HEPARIN LEVEL (UNFRACTIONATED): Heparin Unfractionated: <0.1 [IU]/mL — ABNORMAL LOW (ref 0.30–0.70)

## 2023-10-03 LAB — BRAIN NATRIURETIC PEPTIDE: B Natriuretic Peptide: 384 pg/mL — ABNORMAL HIGH (ref 0.0–100.0)

## 2023-10-03 MED ORDER — LORATADINE 10 MG PO TABS
10.0000 mg | ORAL_TABLET | Freq: Every day | ORAL | Status: DC
  Administered 2023-10-03 – 2023-10-07 (×5): 10 mg via ORAL
  Filled 2023-10-03 (×5): qty 1

## 2023-10-03 MED ORDER — LORAZEPAM 1 MG PO TABS
1.0000 mg | ORAL_TABLET | Freq: Four times a day (QID) | ORAL | Status: DC | PRN
  Administered 2023-10-03 – 2023-10-06 (×9): 1 mg via ORAL
  Filled 2023-10-03 (×10): qty 1

## 2023-10-03 MED ORDER — SODIUM CHLORIDE 0.9% FLUSH
3.0000 mL | Freq: Two times a day (BID) | INTRAVENOUS | Status: DC
  Administered 2023-10-03 – 2023-10-06 (×7): 3 mL via INTRAVENOUS

## 2023-10-03 MED ORDER — CYCLOBENZAPRINE HCL 10 MG PO TABS
5.0000 mg | ORAL_TABLET | Freq: Three times a day (TID) | ORAL | Status: DC | PRN
  Administered 2023-10-03 – 2023-10-06 (×6): 5 mg via ORAL
  Filled 2023-10-03 (×6): qty 1

## 2023-10-03 MED ORDER — HEPARIN BOLUS VIA INFUSION
4000.0000 [IU] | Freq: Once | INTRAVENOUS | Status: AC
  Administered 2023-10-03: 4000 [IU] via INTRAVENOUS
  Filled 2023-10-03: qty 4000

## 2023-10-03 MED ORDER — FLUTICASONE PROPIONATE 50 MCG/ACT NA SUSP
2.0000 | Freq: Every day | NASAL | Status: DC
  Administered 2023-10-04 – 2023-10-07 (×4): 2 via NASAL
  Filled 2023-10-03: qty 16

## 2023-10-03 MED ORDER — ASPIRIN 81 MG PO TBEC
81.0000 mg | DELAYED_RELEASE_TABLET | Freq: Every day | ORAL | Status: DC
  Administered 2023-10-03 – 2023-10-07 (×4): 81 mg via ORAL
  Filled 2023-10-03 (×5): qty 1

## 2023-10-03 MED ORDER — MAGNESIUM OXIDE -MG SUPPLEMENT 400 (240 MG) MG PO TABS
400.0000 mg | ORAL_TABLET | Freq: Once | ORAL | Status: AC
  Administered 2023-10-03: 400 mg via ORAL
  Filled 2023-10-03: qty 1

## 2023-10-03 MED ORDER — ONDANSETRON HCL 4 MG/2ML IJ SOLN: 4.0000 mg | Freq: Four times a day (QID) | INTRAMUSCULAR | Status: DC | PRN

## 2023-10-03 MED ORDER — ACETAMINOPHEN 325 MG PO TABS
650.0000 mg | ORAL_TABLET | ORAL | Status: DC | PRN
  Administered 2023-10-03 – 2023-10-06 (×6): 650 mg via ORAL
  Filled 2023-10-03 (×6): qty 2

## 2023-10-03 MED ORDER — HEPARIN (PORCINE) 25000 UT/250ML-% IV SOLN
2900.0000 [IU]/h | INTRAVENOUS | Status: DC
  Administered 2023-10-03: 1250 [IU]/h via INTRAVENOUS
  Administered 2023-10-04: 1600 [IU]/h via INTRAVENOUS
  Administered 2023-10-05: 2200 [IU]/h via INTRAVENOUS
  Administered 2023-10-05: 2700 [IU]/h via INTRAVENOUS
  Administered 2023-10-06: 2900 [IU]/h via INTRAVENOUS
  Filled 2023-10-03 (×6): qty 250

## 2023-10-03 MED ORDER — IOHEXOL 350 MG/ML SOLN
75.0000 mL | Freq: Once | INTRAVENOUS | Status: AC | PRN
  Administered 2023-10-03: 75 mL via INTRAVENOUS

## 2023-10-03 MED ORDER — SODIUM CHLORIDE 0.9% FLUSH: 3.0000 mL | INTRAVENOUS | Status: DC | PRN

## 2023-10-03 MED ORDER — SODIUM CHLORIDE 0.9 % IV SOLN: 250.0000 mL | INTRAVENOUS | Status: AC | PRN

## 2023-10-03 MED ORDER — FUROSEMIDE 10 MG/ML IJ SOLN
40.0000 mg | Freq: Two times a day (BID) | INTRAMUSCULAR | Status: DC
  Administered 2023-10-03 – 2023-10-06 (×6): 40 mg via INTRAVENOUS
  Filled 2023-10-03 (×6): qty 4

## 2023-10-03 MED ORDER — ALPRAZOLAM 0.25 MG PO TABS
0.2500 mg | ORAL_TABLET | Freq: Three times a day (TID) | ORAL | Status: DC | PRN
  Administered 2023-10-03: 0.25 mg via ORAL
  Filled 2023-10-03: qty 1

## 2023-10-03 MED ORDER — POTASSIUM CHLORIDE CRYS ER 20 MEQ PO TBCR
40.0000 meq | EXTENDED_RELEASE_TABLET | ORAL | Status: AC
  Administered 2023-10-03 (×2): 40 meq via ORAL
  Filled 2023-10-03 (×2): qty 2

## 2023-10-03 MED ORDER — CARVEDILOL 3.125 MG PO TABS
3.1250 mg | ORAL_TABLET | Freq: Two times a day (BID) | ORAL | Status: DC
Start: 2023-10-03 — End: 2023-10-06
  Administered 2023-10-03 – 2023-10-06 (×7): 3.125 mg via ORAL
  Filled 2023-10-03 (×7): qty 1

## 2023-10-03 MED ORDER — FUROSEMIDE 10 MG/ML IJ SOLN
20.0000 mg | Freq: Once | INTRAMUSCULAR | Status: AC
  Administered 2023-10-03: 20 mg via INTRAVENOUS
  Filled 2023-10-03: qty 4

## 2023-10-03 MED ORDER — ORAL CARE MOUTH RINSE: 15.0000 mL | OROMUCOSAL | Status: DC | PRN

## 2023-10-03 MED ORDER — ATORVASTATIN CALCIUM 20 MG PO TABS
40.0000 mg | ORAL_TABLET | Freq: Every day | ORAL | Status: DC
  Administered 2023-10-03 – 2023-10-07 (×5): 40 mg via ORAL
  Filled 2023-10-03 (×5): qty 2

## 2023-10-03 MED ORDER — HEPARIN BOLUS VIA INFUSION
2800.0000 [IU] | Freq: Once | INTRAVENOUS | Status: AC
  Administered 2023-10-03: 2800 [IU] via INTRAVENOUS
  Filled 2023-10-03: qty 2800

## 2023-10-03 MED ORDER — LOSARTAN POTASSIUM 50 MG PO TABS
100.0000 mg | ORAL_TABLET | Freq: Every day | ORAL | Status: DC
Start: 2023-10-03 — End: 2023-10-04
  Administered 2023-10-03 – 2023-10-04 (×2): 100 mg via ORAL
  Filled 2023-10-03 (×2): qty 2

## 2023-10-03 MED ORDER — DIAZEPAM 5 MG/ML IJ SOLN
2.5000 mg | Freq: Once | INTRAMUSCULAR | Status: AC
  Administered 2023-10-03: 2.5 mg via INTRAVENOUS
  Filled 2023-10-03: qty 2

## 2023-10-03 NOTE — ED Triage Notes (Signed)
 Pt reports yesterday he developed flu like symptoms, pt reports while trying to lay down last night his sob got worse. Pt reports he has hx CHF.

## 2023-10-03 NOTE — H&P (Signed)
 History and Physical    Terrance Hawkins FMW:[id number] DOB: [birth year] DOA: 10/03/2023  PCP: [facility] (Confirm with patient/family/NH records and if not entered, this has to be entered at [facility] point of entry) Patient coming from: Home  I have personally briefly reviewed patient's old medical records in [facility]  Chief Complaint: SOB  HPI: Terrance Hawkins is a 33 y.o. male with medical history significant of .  Chronic HFrEF with LVEF, refractory HTN, noncompliant with home medications, presented with new onset of shortness of breath.  Symptoms started last night, patient suddenly developed shortness of breath, worsening with lying down and developed orthopnea at night.  Denies any cough no chest pain no lightheadedness no palpitations.  He was diagnosed with systolic CHF in 2016, he used to take  about 12 pills for his heart and BP problems but he decided to stop taking them 1 year ago and has not been follow-up with any doctor for the past 5 years.  Denied any drug use.  ED Course: Afebrile, blood pressure 150/100 ultrastructure 100% room air.  Chest x-ray showed pulmonary congestion, blood work showed troponin 15,000> 14,000, EKG showed no acute ST changes, blood work showed K3.3 BUN 10 creatinine 0.8 AST 91 AST 46.  Patient was started on heparin  drip and IV Lasix  in the ED.  Review of Systems: As per HPI otherwise 14 point review of systems negative.    Past Medical History:  Diagnosis Date   CHF (congestive heart failure) (HCC)    Hypertension     Past Surgical History:  Procedure Laterality Date   THYROID SURGERY       reports that he has quit smoking. His smoking use included cigarettes. He has never used smokeless tobacco. He reports current alcohol use. He reports current drug use. Frequency: 4.00 times per week. Drugs: Marijuana and Cocaine.  No Known Allergies  Family History  Problem Relation Age of Onset   Diabetes Mother     Kidney failure Mother    Hypertension Father    Diabetes Maternal Grandmother    Breast cancer Paternal Grandmother      Prior to Admission medications   Medication Sig Start Date End Date Taking? Authorizing Provider  atorvastatin  (LIPITOR) 20 MG tablet Take 20 mg by mouth daily. 09/19/23  Yes [provider]  cetirizine (ZYRTEC) 10 MG tablet Take 10 mg by mouth daily. 11/28/19  Yes [provider]  diltiazem (CARDIZEM CD) 300 MG 24 hr capsule Take 300 mg by mouth daily. 05/03/22  Yes [provider]  FLONASE  ALLERGY RELIEF 50 MCG/ACT nasal spray Place 2 sprays into both nostrils daily. 11/10/20  Yes [provider]  Vitamin D , Ergocalciferol , (DRISDOL) 1.25 MG (50000 UNIT) CAPS capsule Take 50,000 Units by mouth every 7 (seven) days. 09/19/23  Yes [provider]  amLODipine  (NORVASC ) 10 MG tablet Take 1 tablet (10 mg total) by mouth daily. 03/20/17   Sherial Bail, MD  carvedilol  (COREG ) 25 MG tablet Take 25 mg by mouth 2 (two) times daily. 05/17/22   [provider]  cloNIDine  (CATAPRES ) 0.2 MG tablet Take 1 tablet (0.2 mg total) by mouth 2 (two) times daily. 03/20/17   Sherial Bail, MD  furosemide  (LASIX ) 40 MG tablet Take 1 tablet (40 mg total) by mouth daily. 03/20/17   Sherial Bail, MD  HYDROcodone -acetaminophen  (NORCO) 5-325 MG tablet Take 1 tablet by mouth every 4 (four) hours as needed for moderate pain. 10/18/22   Bradler,  Evan K, MD  losartan  (COZAAR ) 100 MG tablet Take 100 mg by mouth daily. 05/17/22   [provider]  Meloxicam  15 MG TBDP Take 15 mg by mouth daily. 11/04/22   Penne Knee, MD  omeprazole (PRILOSEC) 20 MG capsule Take 20 mg by mouth daily. 05/17/22   [provider]  potassium chloride  SA (KLOR-CON  M) 20 MEQ tablet Take 20 mEq by mouth daily. 05/17/22   [provider]  spironolactone  (ALDACTONE ) 25 MG tablet Take 25 mg by mouth daily. 05/17/22   [provider]   venlafaxine XR (EFFEXOR-XR) 75 MG 24 hr capsule Take 75 mg by mouth daily. 05/17/22   [provider]    Physical Exam: Vitals:   10/03/23 0658 10/03/23 0800 10/03/23 0850 10/03/23 0930  BP: (!) 157/109 (!) 166/118 (!) 148/101 (!) 120/92  Pulse: 87 72 73 71  Resp: (!) 22 19 20  (!) 29  Temp: 98.8 F (37.1 C)     TempSrc: Oral     SpO2: 94% 100% 99% 99%  Weight:      Height:        Constitutional: NAD, calm, comfortable Vitals:   10/03/23 0658 10/03/23 0800 10/03/23 0850 10/03/23 0930  BP: (!) 157/109 (!) 166/118 (!) 148/101 (!) 120/92  Pulse: 87 72 73 71  Resp: (!) 22 19 20  (!) 29  Temp: 98.8 F (37.1 C)     TempSrc: Oral     SpO2: 94% 100% 99% 99%  Weight:      Height:       Eyes: PERRL, lids and conjunctivae normal ENMT: Mucous membranes are moist. Posterior pharynx clear of any exudate or lesions.Normal dentition.  Neck: normal, supple, no masses, no thyromegaly Respiratory: clear to auscultation bilaterally, no wheezing, bilateral fine crackles to mid levels, increasing respiratory effort. No accessory muscle use.  Cardiovascular: Regular rate and rhythm, no murmurs / rubs / gallops.  1+ extremity edema. 2+ pedal pulses. No carotid bruits.  Abdomen: no tenderness, no masses palpated. No hepatosplenomegaly. Bowel sounds positive.  Musculoskeletal: no clubbing / cyanosis. No joint deformity upper and lower extremities. Good ROM, no contractures. Normal muscle tone.  Skin: no rashes, lesions, ulcers. No induration Neurologic: CN 2-12 grossly intact. Sensation intact, DTR normal. Strength 5/5 in all 4.  Psychiatric: Normal judgment and insight. Alert and oriented x 3. Normal mood.   Labs on Admission: I have personally reviewed following labs and imaging studies  CBC: Recent Labs  Lab 10/03/23 0659  WBC 12.0*  NEUTROABS 8.1*  HGB 15.7  HCT 46.5  MCV 91.5  PLT 156   Basic Metabolic Panel: Recent Labs  Lab 10/03/23 0702  NA 137  K 3.3*  CL 104  CO2  21*  GLUCOSE 104*  BUN 10  CREATININE 0.87  CALCIUM  8.7*   GFR: Estimated Creatinine Clearance: 148.4 mL/min (by C-G formula based on SCr of 0.87 mg/dL). Liver Function Tests: Recent Labs  Lab 10/03/23 0702  AST 91*  ALT 45*  ALKPHOS 36*  BILITOT 0.9  PROT 6.5  ALBUMIN 3.6   No results for input(s): LIPASE, AMYLASE in the last 168 hours. No results for input(s): AMMONIA in the last 168 hours. Coagulation Profile: No results for input(s): INR, PROTIME in the last 168 hours. Cardiac Enzymes: No results for input(s): CKTOTAL, CKMB, CKMBINDEX, TROPONINI in the last 168 hours. BNP (last 3 results) No results for input(s): PROBNP in the last 8760 hours. HbA1C: No results for input(s): HGBA1C in the last 72 hours.  CBG: No results for input(s): GLUCAP in the last 168 hours. Lipid Profile: No results for input(s): CHOL, HDL, LDLCALC, TRIG, CHOLHDL, LDLDIRECT in the last 72 hours. Thyroid Function Tests: No results for input(s): TSH, T4TOTAL, FREET4, T3FREE, THYROIDAB in the last 72 hours. Anemia Panel: No results for input(s): VITAMINB12, FOLATE, FERRITIN, TIBC, IRON, RETICCTPCT in the last 72 hours. Urine analysis:    Component Value Date/Time   COLORURINE YELLOW (A) 10/18/2022 1302   APPEARANCEUR HAZY (A) 10/18/2022 1302   APPEARANCEUR Cloudy 05/08/2011 1955   LABSPEC 1.011 10/18/2022 1302   LABSPEC 1.012 05/08/2011 1955   PHURINE 6.0 10/18/2022 1302   GLUCOSEU NEGATIVE 10/18/2022 1302   GLUCOSEU Negative 05/08/2011 1955   HGBUR SMALL (A) 10/18/2022 1302   BILIRUBINUR NEGATIVE 10/18/2022 1302   BILIRUBINUR Negative 05/08/2011 1955   KETONESUR NEGATIVE 10/18/2022 1302   PROTEINUR NEGATIVE 10/18/2022 1302   NITRITE NEGATIVE 10/18/2022 1302   LEUKOCYTESUR NEGATIVE 10/18/2022 1302   LEUKOCYTESUR 3+ 05/08/2011 1955    Radiological Exams on Admission: CT Angio Chest PE W/Cm &/Or Wo Cm Result Date:  10/03/2023 CLINICAL DATA:  Pulmonary embolism (PE) suspected, high prob Flu-like symptoms since yesterday. Worsening shortness of breath. History of congestive heart EXAM: CT ANGIOGRAPHY CHEST WITH CONTRAST TECHNIQUE: Multidetector CT imaging of the chest was performed using the standard protocol during bolus administration of intravenous contrast. Multiplanar CT image reconstructions and MIPs were obtained to evaluate the vascular anatomy. RADIATION DOSE REDUCTION: This exam was performed according to the departmental dose-optimization program which includes automated exposure control, adjustment of the mA and/or kV according to patient size and/or use of iterative reconstruction technique. CONTRAST:  75mL OMNIPAQUE  IOHEXOL  350 MG/ML SOLN COMPARISON:  Same day radiographs.  Chest CTA 08/12/2014. FINDINGS: Cardiovascular: The pulmonary arteries are suboptimally opacified with contrast to the level of the segmental branches. There is no evidence of acute pulmonary embolism. Assessment beyond the segmental level is limited. No acute or significant systemic arterial abnormalities are identified. The heart is mildly enlarged. No significant pericardial fluid. Mediastinum/Nodes: Mildly prominent mediastinal and hilar lymph nodes are similar to the previous study, likely reactive. No axillary lymphadenopathy. The thyroid gland, trachea and esophagus demonstrate no significant findings. Lungs/Pleura: Trace bilateral pleural effusions. No pneumothorax. Pulmonary assessment limited by breathing artifact. As seen on earlier radiographs, there are patchy and confluent ground-glass opacities in both lungs, most consistent with pulmonary edema. There are ill-defined nodular components in the right upper lobe measuring up to 9 mm on image 45/6. Upper abdomen: No significant findings are seen within the visualized upper abdomen. Musculoskeletal/Chest wall: There is no chest wall mass or suspicious osseous finding. Cervicothoracic  scoliosis and mild spondylosis noted. Review of the MIP images confirms the above findings. IMPRESSION: 1. No evidence of acute pulmonary embolism or other acute vascular findings in the chest. 2. Patchy and confluent ground-glass opacities in both lungs, most consistent with pulmonary edema or atypical infection. There are ill-defined nodular components in the right upper lobe measuring up to 9 mm which could reflect areas of focal inflammation. Given the patient's age, no specific follow-up imaging is recommended unless clinically warranted. 3. Trace bilateral pleural effusions. 4. Mildly prominent mediastinal and hilar lymph nodes, similar to previous study and likely reactive. Electronically Signed   By: Elsie Perone M.D.   On: 10/03/2023 09:20   DG Chest 2 View Result Date: 10/03/2023 EXAM: 2 VIEW(S) XRAY OF THE CHEST 10/03/2023 07:17:16 AM COMPARISON: None available. CLINICAL HISTORY: SOB. Pt reports yesterday he developed  flu like symptoms, pt reports while trying to lay down last night his SOB got worse. Pt reports he has hx CHF. FINDINGS: LUNGS AND PLEURA: Bilateral interstitial prominence. Mild pulmonary edema. No focal pulmonary opacity. No pleural effusion. No pneumothorax. HEART AND MEDIASTINUM: Mild cardiomegaly. No acute abnormality of the mediastinal silhouette. BONES AND SOFT TISSUES: No acute osseous abnormality. IMPRESSION: 1. Mild pulmonary edema with diffuse interstitial prominence. 2. Mild cardiomegaly. Electronically signed by: Waddell Calk MD 10/03/2023 07:38 AM EDT RP Workstation: [id number]    EKG: Independently reviewed.  Sinus rhythm, similar ST-T changes in lead to 3 aVF and lead V5 and V6 Assessment/Plan Principal Problem:   NSTEMI (non-ST elevated myocardial infarction) (HCC) Active Problems:   Acute on chronic systolic heart failure (HCC)  (please populate well all problems here in Problem List. (For example, if patient is on BP meds at home and you resume or decide to  hold them, it is a problem that needs to be her. Same for CAD, COPD, HLD and so on)  NSTEMI - Continue ACS medication including aspirin , statin - Resume Coreg  small dose, ARB - Continue heparin  drip - N.p.o. for now, waiting for cardiology consultation  HTN emergency - With signs of endorgan damage of NSTEMI and CHF, - Improving after IV Lasix  - Resume small dose of Coreg  and losartan  - UDS  Acute on chronic HFrEF decompensation - Secondary to noncompliant with BP/CHF medications - IV diuresis Lasix  40 mg twice daily, repeat chest x-ray tomorrow - BP control as above  Hypokalemia - P.o. replacement, check magnesium  level  Total time spent on patient care 55 minutes  DVT prophylaxis: Heparin  drip Code Status: Full code Family Communication: Wife at bedside Disposition Plan: Patient is sick with NSTEMI requiring Consults called: Cardiology Admission status: PCU admit   Cort ONEIDA Mana MD [facility] Pager [phone]  10/03/2023, 10:17 AM

## 2023-10-03 NOTE — ED Provider Notes (Signed)
 [facility] Provider Note   Event Date/Time   First MD Initiated Contact with Patient 10/03/23 [phone]     (approximate) History  Shortness of Breath  HPI Terrance Hawkins is a 33 y.o. male with a past medical history of polysubstance abuse, CHF, hypertension, morbid obesity, manage primary hyperparathyroidism who presents complaining of worsening shortness of breath, subjective fever, and nonproductive cough that has been worsening over the last 24 hours.  Patient states that he has not been taking any of the medications that he should be on for at least a year.  Patient does not know who his cardiologist is.  Patient endorses recent sick contacts with a feeling member at home with upper respiratory symptoms including cough, sore throat, and fever.  This for member was recently diagnosed with COVID. ROS: Patient currently denies any vision changes, tinnitus, difficulty speaking, facial droop, sore throat, chest pain, abdominal pain, nausea/vomiting/diarrhea, dysuria, or weakness/numbness/paresthesias in any extremity   Physical Exam  Triage Vital Signs: ED Triage Vitals  Encounter Vitals Group     BP 10/03/23 0658 (!) 157/109     Girls Systolic BP Percentile --      Girls Diastolic BP Percentile --      Boys Systolic BP Percentile --      Boys Diastolic BP Percentile --      Pulse Rate 10/03/23 0658 87     Resp 10/03/23 0658 (!) 22     Temp 10/03/23 0658 98.8 F (37.1 C)     Temp Source 10/03/23 0658 Oral     SpO2 10/03/23 0658 94 %     Weight 10/03/23 0657 265 lb (120.2 kg)     Height 10/03/23 0657 5' 7 (1.702 m)     Head Circumference --      Peak Flow --      Pain Score 10/03/23 0657 0     Pain Loc --      Pain Education --      Exclude from Growth Chart --    Most recent vital signs: Vitals:   10/03/23 0850 10/03/23 0930  BP: (!) 148/101 (!) 120/92  Pulse: 73 71  Resp: 20 (!) 29  Temp:    SpO2: 99% 99%   General: Awake, oriented  x4. CV:  Good peripheral perfusion. Resp:  Increased effort.  Rales over bilateral lung fields Abd:  No distention. Other:  Middle-aged obese African-American male resting comfortably in no acute distress ED Results / Procedures / Treatments  Labs (all labs ordered are listed, but only abnormal results are displayed) Labs Reviewed  CBC WITH DIFFERENTIAL/PLATELET - Abnormal; Notable for the following components:      Result Value   WBC 12.0 (*)    Neutro Abs 8.1 (*)    Monocytes Absolute 1.4 (*)    All other components within normal limits  COMPREHENSIVE METABOLIC PANEL WITH GFR - Abnormal; Notable for the following components:   Potassium 3.3 (*)    CO2 21 (*)    Glucose, Bld 104 (*)    Calcium  8.7 (*)    AST 91 (*)    ALT 45 (*)    Alkaline Phosphatase 36 (*)    All other components within normal limits  BRAIN NATRIURETIC PEPTIDE - Abnormal; Notable for the following components:   B Natriuretic Peptide 384.0 (*)    All other components within normal limits  TROPONIN I (HIGH SENSITIVITY) - Abnormal; Notable for the following components:   Troponin I (High  Sensitivity) 15,417 (*)    All other components within normal limits  TROPONIN I (HIGH SENSITIVITY) - Abnormal; Notable for the following components:   Troponin I (High Sensitivity) 14,248 (*)    All other components within normal limits  RESP PANEL BY RT-PCR (RSV, FLU A&B, COVID)  RVPGX2  HEPARIN  LEVEL (UNFRACTIONATED)  URINE DRUG SCREEN, QUALITATIVE ([facility] ONLY)   EKG ED ECG REPORT I, Artist MARLA Kerns, the attending physician, personally viewed and interpreted this ECG. Date: 10/03/2023 EKG Time: 0701 Rate: 85 Rhythm: normal sinus rhythm QRS Axis: normal Intervals: normal ST/T Wave abnormalities: normal Narrative Interpretation: no evidence of acute ischemia RADIOLOGY ED MD interpretation: 2 view chest x-ray shows mild pulmonary edema with diffuse interstitial prominence and mild cardiomegaly  CT angiography of the  chest with contrast shows no evidence of acute pulmonary embolism or other vascular findings however there is patchy and confluent ground glass opacities in bilateral lungs consistent with pulmonary edema - All radiology independently interpreted and agree with radiology assessment Official radiology report(s): CT Angio Chest PE W/Cm &/Or Wo Cm Result Date: 10/03/2023 CLINICAL DATA:  Pulmonary embolism (PE) suspected, high prob Flu-like symptoms since yesterday. Worsening shortness of breath. History of congestive heart EXAM: CT ANGIOGRAPHY CHEST WITH CONTRAST TECHNIQUE: Multidetector CT imaging of the chest was performed using the standard protocol during bolus administration of intravenous contrast. Multiplanar CT image reconstructions and MIPs were obtained to evaluate the vascular anatomy. RADIATION DOSE REDUCTION: This exam was performed according to the departmental dose-optimization program which includes automated exposure control, adjustment of the mA and/or kV according to patient size and/or use of iterative reconstruction technique. CONTRAST:  75mL OMNIPAQUE  IOHEXOL  350 MG/ML SOLN COMPARISON:  Same day radiographs.  Chest CTA 08/12/2014. FINDINGS: Cardiovascular: The pulmonary arteries are suboptimally opacified with contrast to the level of the segmental branches. There is no evidence of acute pulmonary embolism. Assessment beyond the segmental level is limited. No acute or significant systemic arterial abnormalities are identified. The heart is mildly enlarged. No significant pericardial fluid. Mediastinum/Nodes: Mildly prominent mediastinal and hilar lymph nodes are similar to the previous study, likely reactive. No axillary lymphadenopathy. The thyroid gland, trachea and esophagus demonstrate no significant findings. Lungs/Pleura: Trace bilateral pleural effusions. No pneumothorax. Pulmonary assessment limited by breathing artifact. As seen on earlier radiographs, there are patchy and confluent  ground-glass opacities in both lungs, most consistent with pulmonary edema. There are ill-defined nodular components in the right upper lobe measuring up to 9 mm on image 45/6. Upper abdomen: No significant findings are seen within the visualized upper abdomen. Musculoskeletal/Chest wall: There is no chest wall mass or suspicious osseous finding. Cervicothoracic scoliosis and mild spondylosis noted. Review of the MIP images confirms the above findings. IMPRESSION: 1. No evidence of acute pulmonary embolism or other acute vascular findings in the chest. 2. Patchy and confluent ground-glass opacities in both lungs, most consistent with pulmonary edema or atypical infection. There are ill-defined nodular components in the right upper lobe measuring up to 9 mm which could reflect areas of focal inflammation. Given the patient's age, no specific follow-up imaging is recommended unless clinically warranted. 3. Trace bilateral pleural effusions. 4. Mildly prominent mediastinal and hilar lymph nodes, similar to previous study and likely reactive. Electronically Signed   By: Elsie Perone M.D.   On: 10/03/2023 09:20   DG Chest 2 View Result Date: 10/03/2023 EXAM: 2 VIEW(S) XRAY OF THE CHEST 10/03/2023 07:17:16 AM COMPARISON: None available. CLINICAL HISTORY: SOB. Pt reports yesterday he developed  flu like symptoms, pt reports while trying to lay down last night his SOB got worse. Pt reports he has hx CHF. FINDINGS: LUNGS AND PLEURA: Bilateral interstitial prominence. Mild pulmonary edema. No focal pulmonary opacity. No pleural effusion. No pneumothorax. HEART AND MEDIASTINUM: Mild cardiomegaly. No acute abnormality of the mediastinal silhouette. BONES AND SOFT TISSUES: No acute osseous abnormality. IMPRESSION: 1. Mild pulmonary edema with diffuse interstitial prominence. 2. Mild cardiomegaly. Electronically signed by: Waddell Calk MD 10/03/2023 07:38 AM EDT RP Workstation: [id number]   PROCEDURES: Critical Care  performed: Yes, see critical care procedure note(s) .1-3 Lead EKG Interpretation  Performed by: Jossie Artist POUR, MD Authorized by: Jossie Artist POUR, MD     Interpretation: normal     ECG rate:  71   ECG rate assessment: normal     Rhythm: sinus rhythm     Ectopy: none     Conduction: normal   CRITICAL CARE Performed by: Adrea Sherpa K Maurene Hollin  Total critical care time: 33 minutes  Critical care time was exclusive of separately billable procedures and treating other patients.  Critical care was necessary to treat or prevent imminent or life-threatening deterioration.  Critical care was time spent personally by me on the following activities: development of treatment plan with patient and/or surrogate as well as nursing, discussions with consultants, evaluation of patient's response to treatment, examination of patient, obtaining history from patient or surrogate, ordering and performing treatments and interventions, ordering and review of laboratory studies, ordering and review of radiographic studies, pulse oximetry and re-evaluation of patient's condition.  MEDICATIONS ORDERED IN ED: Medications  heparin  ADULT infusion 100 units/mL (25000 units/250mL) (1,250 Units/hr Intravenous New Bag/Given 10/03/23 0846)  furosemide  (LASIX ) injection 20 mg (20 mg Intravenous Given 10/03/23 0806)  diazepam  (VALIUM ) injection 2.5 mg (2.5 mg Intravenous Given 10/03/23 0806)  heparin  bolus via infusion 4,000 Units (4,000 Units Intravenous Bolus from Bag 10/03/23 0847)  iohexol  (OMNIPAQUE ) 350 MG/ML injection 75 mL (75 mLs Intravenous Contrast Given 10/03/23 0828)   IMPRESSION / MDM / ASSESSMENT AND PLAN / ED COURSE  I reviewed the triage vital signs and the nursing notes.                             The patient is on the cardiac monitor to evaluate for evidence of arrhythmia and/or significant heart rate changes. Patient's presentation is most consistent with acute presentation with potential threat to life or bodily  function. Patient is a 32 year old male with the above-stated past medical history that presents for shortness of breath, subjective fevers, and nonproductive cough. DDx: ACS, CHF exacerbation, COVID infection, pericarditis, aortic dissection, pneumonia, sepsis Plan: CBC showing leukocytosis, CMP showing mild hypokalemia 3.3, mild hypocalcemia at 8.7, and mildly elevated AST and ALT at 91/45 respectively.  Patient's BNP elevated at 384 troponin 15,417 CT angiography of the chest shows no evidence of PE or aortic disease.  Tx: Heparin  drip Lasix   I spoke to Dr. Ammon and cardiology whose team informed me that as patient has not seen their cardio cardiology service in over 3 years and patient has defaulted to unassigned.  Due to this, I spoke to Dr. Mady and [facility] cardiology who will see this patient in consultation.  I spoke to Dr. Laurita on the hospitalist service who agreed to accept this patient for further evaluation and management  Dispo: Admit to medicine   FINAL CLINICAL IMPRESSION(S) / ED DIAGNOSES   Final diagnoses:  Acute pulmonary  edema (HCC)  SOB (shortness of breath)  Respiratory distress  Elevated troponin  NSTEMI (non-ST elevated myocardial infarction) (HCC)   Rx / DC Orders   ED Discharge Orders     None      Note:  This document was prepared using Dragon voice recognition software and may include unintentional dictation errors.   Jossie Artist POUR, MD 10/03/23 1005

## 2023-10-03 NOTE — Progress Notes (Incomplete)
 Heart Failure Stewardship Pharmacy Note  PCP: [facility] PCP-Cardiologist: None  HPI: Terrance Hawkins is a 33 y.o. male with *** who presented with ***.   Pertinent cardiac history:  Pertinent Lab Values: Creatinine  Date Value Ref Range Status  04/13/2012 0.71 0.60 - 1.30 mg/dL Final   Creatinine, Ser  Date Value Ref Range Status  10/03/2023 0.87 0.61 - 1.24 mg/dL Final   BUN  Date Value Ref Range Status  10/03/2023 10 6 - 20 mg/dL Final  91/84/7980 9 6 - 20 mg/dL Final  96/78/7985 6 (L) 7 - 18 mg/dL Final   Potassium  Date Value Ref Range Status  10/03/2023 3.3 (L) 3.5 - 5.1 mmol/L Final  04/13/2012 3.5 3.5 - 5.1 mmol/L Final   Sodium  Date Value Ref Range Status  10/03/2023 137 135 - 145 mmol/L Final  09/07/2017 143 134 - 144 mmol/L Final  04/13/2012 138 136 - 145 mmol/L Final   B Natriuretic Peptide  Date Value Ref Range Status  10/03/2023 384.0 (H) 0.0 - 100.0 pg/mL Final    Comment:    Performed at [facility], [address] [place], [place] [address]   Magnesium   Date Value Ref Range Status  10/03/2023 1.8 1.7 - 2.4 mg/dL Final    Comment:    Performed at [facility], [address] [place], [place] [address]   Hgb A1c MFr Bld  Date Value Ref Range Status  03/19/2017 5.3 4.8 - 5.6 % Final    Comment:    (NOTE)         Prediabetes: 5.7 - 6.4         Diabetes: >6.4         Glycemic control for adults with diabetes: <7.0    TSH  Date Value Ref Range Status  03/19/2017 2.520 0.350 - 4.500 uIU/mL Final    Comment:    Performed by a 3rd Generation assay with a functional sensitivity of <=0.01 uIU/mL. Performed at [facility], [address] [place], [place] [address]     Vital Signs: Admission weight: Temp:  [98.8 F (37.1 C)] 98.8 F (37.1 C) (09/09 0658) Pulse Rate:  [71-87] 71 (09/09 0930) Cardiac Rhythm: Normal sinus rhythm (09/09 1015) Resp:  [19-29] 29 (09/09 0930) BP:  (120-166)/(92-118) 120/92 (09/09 0930) SpO2:  [94 %-100 %] 99 % (09/09 0930) Weight:  [120.2 kg (265 lb)] 120.2 kg (265 lb) (09/09 0657)  Intake/Output Summary (Last 24 hours) at 10/03/2023 1051 Last data filed at 10/03/2023 1041 Gross per 24 hour  Intake --  Output 2050 ml  Net -2050 ml    Current Heart Failure Medications:  Loop diuretic: Beta-Blocker: ACEI/ARB/ARNI: MRA: SGLT2i: Other:  Prior to admission Heart Failure Medications:  Loop diuretic: Beta-Blocker: ACEI/ARB/ARNI: MRA: SGLT2i: Other:  Assessment: 1. {CHL AMB Acute or Chronic:210917265}  - Plan: 1) Medication changes recommended at this time:  2) Patient assistance:   3) Education: -To be completed prior to discharge.  *** Medication Assistance / Insurance Benefits Check: Does the patient have prescription insurance?    Type of insurance plan:  Does the patient qualify for medication assistance through manufacturers or grants? {CHL AMB Yes/No/Pending:210917269}  Eligible grants and/or patient assistance programs: ***  Medication assistance applications in progress: ***  Medication assistance applications approved: *** Approved medication assistance renewals will be completed by: ***  Outpatient Pharmacy: Prior to admission outpatient pharmacy: ***      ***

## 2023-10-03 NOTE — ED Notes (Signed)
 Patient requesting 2L of O2 due to sleep apnea; placed on patient.

## 2023-10-03 NOTE — Consult Note (Signed)
 Cardiology Consultation   Patient ID: Terrance Hawkins MRN: [id number]; DOB: Mar 22, 1990  Admit date: 10/03/2023 Date of Consult: 10/03/2023  PCP:  [facility] Providers Cardiologist:  None  Cardiology APP:  Donette Ellouise LABOR, FNP       Patient Profile: Terrance Hawkins is a 33 y.o. male with a hx of polysubstance abuse, HFmrEF, hypertension, morbid obesity, and hyperparathyroidism who is being seen 10/03/2023 for the evaluation of shortness of breath at the request of Dr. Jossie.  History of Present Illness: Terrance Hawkins was initially evaluated by [facility] cardiology in 2016 at the age of 36 for shortness of breath and fatigue. At that time he was noncompliant with his antihypertensives. CXR showed volume overload with cardiomegaly. Echo at that time showed EF 40-45% with trivial to mild MR and TR without RWMA. His cardiomyopathy was presumed to be secondary to uncontrolled hypertension. No ischemic evaluation was pursued at that time. He was started on carvedilol , lasix , and lisinopril . He was lost to follow up then seen again by [facility] cardiology in 2019 to reestablish care. He had not been taking his medication but had resumed some prior to his appointment. He was continued on amlodipine , carvedilol , clonidine , and Lasix .   Patient reports sudden onset of shortness of breath yesterday with associated dry cough. Dyspnea worsening when laying down. He denies chest pain, lightheadedness, dizziness, palpitations, and lower extremity swelling. He presented to the ED for further evaluation. In the ED, BP 157/109, RR 22 with otherwise normal vital signs.  Pertinent labs include potassium 3.3, WBC 12.0.  BNP mildly elevated at 384.  Initial troponin 15,417 downtrending to 14,248 > 14,004.  EKG shows sinus rhythm with incomplete LBBB and nonspecific ST/T wave abnormalities, rate 85 bpm.  CTA negative for PE with findings consistent with pulmonary edema.  Chest  x-ray shows mild pulmonary edema and mild cardiomegaly.  UDS positive for cocaine and cannabinoid.  Respiratory panel negative.  Patient was started on IV heparin  and given IV Lasix  20 mg x 1.  Cardiology was asked to consult for further evaluation of dyspnea and elevated troponin.  At time of cardiology consult, patient is resting comfortably on 2 L supplemental oxygen.  He denies any recent exertional symptoms of chest pain or dyspnea.  Prior to yesterday he was feeling well from a cardiac perspective.  He reports ongoing tobacco use of 1 to 2 cigars/day.  He also endorses marijuana and cocaine use several times per week, up to daily.  He reports occasional alcohol use.  He had stopped taking all of his cardiac meds approximately a year ago due to not knowing what they were for and due to price.  Past Medical History:  Diagnosis Date   CHF (congestive heart failure) (HCC)    Hypertension     Past Surgical History:  Procedure Laterality Date   THYROID SURGERY         Scheduled Meds:  aspirin  EC  81 mg Oral Daily   atorvastatin   40 mg Oral Daily   carvedilol   3.125 mg Oral BID   [START ON 10/04/2023] fluticasone   2 spray Each Nare Daily   furosemide   40 mg Intravenous BID   loratadine   10 mg Oral Daily   losartan   100 mg Oral Daily   potassium chloride   40 mEq Oral Q2H   sodium chloride  flush  3 mL Intravenous Q12H   Continuous Infusions:  sodium chloride      heparin  1,250  Units/hr (10/03/23 0846)   PRN Meds: sodium chloride , acetaminophen , ALPRAZolam , cyclobenzaprine , ondansetron  (ZOFRAN ) IV, sodium chloride  flush  Allergies:    Allergies  Allergen Reactions   Lisinopril  Other (See Comments)    Social History:   Social History   Socioeconomic History   Marital status: Single    Spouse name: Not on file   Number of children: Not on file   Years of education: Not on file   Highest education level: Not on file  Occupational History   Not on file  Tobacco Use   Smoking  status: Former    Current packs/day: 0.50    Types: Cigarettes   Smokeless tobacco: Never  Substance and Sexual Activity   Alcohol use: Yes    Alcohol/week: 0.0 standard drinks of alcohol    Comment: occassional   Drug use: Yes    Frequency: 4.0 times per week    Types: Marijuana, Cocaine    Comment: last use was before 09/07/2014  smokes 3-4 blunts a day about 1 gram each   Sexual activity: Not on file  Other Topics Concern   Not on file  Social History Narrative   Not on file   Social Drivers of Health   Financial Resource Strain: Not on file  Food Insecurity: Not on file  Transportation Needs: Not on file  Physical Activity: Not on file  Stress: Not on file  Social Connections: Not on file  Intimate Partner Violence: Not on file    Family History:    Family History  Problem Relation Age of Onset   Diabetes Mother    Kidney failure Mother    Hypertension Father    Diabetes Maternal Grandmother    Breast cancer Paternal Grandmother      ROS:  Please see the history of present illness.   All other ROS reviewed and negative.     Physical Exam/Data: Vitals:   10/03/23 0658 10/03/23 0800 10/03/23 0850 10/03/23 0930  BP: (!) 157/109 (!) 166/118 (!) 148/101 (!) 120/92  Pulse: 87 72 73 71  Resp: (!) 22 19 20  (!) 29  Temp: 98.8 F (37.1 C)     TempSrc: Oral     SpO2: 94% 100% 99% 99%  Weight:      Height:        Intake/Output Summary (Last 24 hours) at 10/03/2023 1137 Last data filed at 10/03/2023 1041 Gross per 24 hour  Intake --  Output 2050 ml  Net -2050 ml      10/03/2023    6:57 AM 11/04/2022   11:30 AM 10/18/2022   11:59 AM  Last 3 Weights  Weight (lbs) 265 lb 300 lb 292 lb  Weight (kg) 120.203 kg 136.079 kg 132.45 kg     Body mass index is 41.5 kg/m.  General:  Well nourished, well developed, in no acute distress HEENT: normal Neck: unable to assess JVD due to body habitus Vascular: No carotid bruits; Distal pulses 2+ bilaterally Cardiac:   normal S1, S2; RRR; no murmur  Lungs: diminished at the bases bilaterally Abd: soft, nontender, no hepatomegaly  Ext: trace LE edema Skin: warm and dry  Psych:  Normal affect   EKG:  The EKG was personally reviewed and demonstrates:  sinus rhythm with incomplete LBBB and nonspecific ST/T wave abnormalities, rate 85 bpm Telemetry:  Telemetry was personally reviewed and demonstrates:  sinus rhythm with PVCs  Relevant CV Studies:  08/2014 Echo complete [facility]) - Left ventricle: The cavity size was mildly dilated. Wall  thickness was increased in a pattern of mild LVH. Systolic    function was mildly to moderately reduced. The estimated ejection    fraction was in the range of 40% to 45%.  - Aortic valve: Valve area (Vmax): 2.35 cm^2.  - Mitral valve: There was mild regurgitation.  - Left atrium: The atrium was mildly dilated.  - Right atrium: The atrium was mildly dilated   Laboratory Data: High Sensitivity Troponin:   Recent Labs  Lab 10/03/23 0702 10/03/23 0857  TROPONINIHS 15,417* 14,248*     Chemistry Recent Labs  Lab 10/03/23 0702 10/03/23 0857  NA 137  --   K 3.3*  --   CL 104  --   CO2 21*  --   GLUCOSE 104*  --   BUN 10  --   CREATININE 0.87  --   CALCIUM  8.7*  --   MG  --  1.8  GFRNONAA >60  --   ANIONGAP 12  --     Recent Labs  Lab 10/03/23 0702  PROT 6.5  ALBUMIN 3.6  AST 91*  ALT 45*  ALKPHOS 36*  BILITOT 0.9   Lipids No results for input(s): CHOL, TRIG, HDL, LABVLDL, LDLCALC, CHOLHDL in the last 168 hours.  Hematology Recent Labs  Lab 10/03/23 0659  WBC 12.0*  RBC 5.08  HGB 15.7  HCT 46.5  MCV 91.5  MCH 30.9  MCHC 33.8  RDW 13.4  PLT 156   Thyroid No results for input(s): TSH, FREET4 in the last 168 hours.  BNP Recent Labs  Lab 10/03/23 0702  BNP 384.0*    DDimer No results for input(s): DDIMER in the last 168 hours.  Radiology/Studies:  CT Angio Chest PE W/Cm &/Or Wo Cm Result Date: 10/03/2023 IMPRESSION: 1.  No evidence of acute pulmonary embolism or other acute vascular findings in the chest. 2. Patchy and confluent ground-glass opacities in both lungs, most consistent with pulmonary edema or atypical infection. There are ill-defined nodular components in the right upper lobe measuring up to 9 mm which could reflect areas of focal inflammation. Given the patient's age, no specific follow-up imaging is recommended unless clinically warranted. 3. Trace bilateral pleural effusions. 4. Mildly prominent mediastinal and hilar lymph nodes, similar to previous study and likely reactive. Electronically Signed   By: Elsie Perone M.D.   On: 10/03/2023 09:20   DG Chest 2 View Result Date: 10/03/2023 IMPRESSION: 1. Mild pulmonary edema with diffuse interstitial prominence. 2. Mild cardiomegaly. Electronically signed by: Waddell Calk MD 10/03/2023 07:38 AM EDT RP Workstation: [id number]   Assessment and Plan:  Acute on chronic CHF - Patient with history of HFmrEF dating back to 2016 and noncompliance with medications.  Previously presumed to be secondary to untreated hypertension.  No prior ischemic evaluation.  - Most recent echo 08/2014 showed EF 40 to 45% with mild LVH - Presented with sudden onset shortness of breath and orthopnea - BNP 384 - Received IV Lasix  20 mg x 1 in the ED with -2.5 L output - Agree with continuing IV Lasix  40 mg twice daily for now - Continue to monitor kidney function, strict I/Os, and daily weights with ongoing diuresis - Patient endorses ongoing cocaine use several times per week up to daily at times, likely contributing to heart failure - Restarted on GDMT including carvedilol  3.125 mg twice daily and losartan  100 mg daily - Echo ordered with further recommendations pending results  Elevated troponin - Troponin 15,417 on admission and downtrending  -  Patient denies chest pain - EKG without acute ischemic changes - Started on IV heparin  in the ED which to be continued -  Suspect that patient will need some type of ischemic evaluation prior to discharge.  Given that he is stable without symptoms of angina, urgent cardiac catheterization not needed at this time. - Obtain echo with further recommendations pending results  Polysubstance abuse  - Patient endorses use of cocaine and tobacco products - Discussed the detrimental effect to cardiac condition with substance use - Recommend cessation  Hypertension - BP elevated on admission - Restarted on losartan  - Continue to monitor   For questions or updates, please contact [facility] Please consult [email or web address] for contact info under    Signed, Lesley LITTIE Maffucci, PA-C  10/03/2023 11:37 AM

## 2023-10-03 NOTE — ED Notes (Addendum)
 Terrance Hawkins

## 2023-10-03 NOTE — ED Notes (Signed)
 Patient complaining of bilateral leg cramping; Dr. Laurita made aware.

## 2023-10-03 NOTE — ED Notes (Signed)
 Critical Result: trop [id number]  Jossie, MD made aware

## 2023-10-03 NOTE — ED Notes (Signed)
 Patient requesting medication for anxiety, MD Zhang made aware. Patient also self removed BP cuff.

## 2023-10-03 NOTE — Progress Notes (Signed)
 PHARMACY - ANTICOAGULATION CONSULT NOTE  Pharmacy Consult for heparin  Indication: chest pain/ACS  Allergies  Allergen Reactions   Lisinopril  Other (See Comments)    Patient Measurements: Height: 5' 7 (170.2 cm) Weight: 120.2 kg (265 lb) IBW/kg (Calculated) : 66.1 HEPARIN  DW (KG): 93.9  Vital Signs: Temp: 98.3 F (36.8 C) (09/09 1631) Temp Source: Oral (09/09 1631) BP: 175/158 (09/09 1740) Pulse Rate: 80 (09/09 1740)  Labs: Recent Labs    10/03/23 0659 10/03/23 0702 10/03/23 0857 10/03/23 1402 10/03/23 1734  HGB 15.7  --   --   --   --   HCT 46.5  --   --   --   --   PLT 156  --   --   --   --   HEPARINUNFRC  --   --   --   --  <0.10*  CREATININE  --  0.87  --   --   --   CKTOTAL  --   --  301  --   --   TROPONINIHS  --  15,417* 14,248* 14,004*  --     Estimated Creatinine Clearance: 148.4 mL/min (by C-G formula based on SCr of 0.87 mg/dL).   Medical History: Past Medical History:  Diagnosis Date   CHF (congestive heart failure) (HCC)    Hypertension     Assessment: Pharmacy consulted to dose heparin  in patient with chest pain/ACS. PMH includes   polysubstance abuse, HFmrEF, hypertension, morbid obesity, and hyperparathyroidism. Patient is not on anticoagulation prior to admission and no compliance with any medications.   Pharmacy consulted to start and manage heparin  infusion for ACS.  CBC WNL Trop 15,417  Date Time Results Comments 9/9 1734 HL < 0.1 1250 units/hours  Goal of Therapy:  Heparin  level 0.3-0.7 units/ml Monitor platelets by anticoagulation protocol: Yes   Plan:  Give 2800 units IV bolus x 1 Increase heparin  infusion rate to 1600 units/hr Check heparin  6 hours after rate change Continue to monitor H&H and platelets  Riven Beebe Rodriguez-Guzman PharmD, BCPS 10/03/2023 7:00 PM

## 2023-10-03 NOTE — Progress Notes (Signed)
 Heart Failure Nurse Navigator Progress Note  PCP: [facility] PCP-Cardiologist: None. Ellouise Class, FNP [facility] 2016 Admission Diagnosis: Acute pulmonary edema (HCC0 SOB 9shortness of breath) Respiratory distress Elevated troponin NSTEMI (non-ST elevated myocardial infarction) [facility]) Admitted from: Home  Presentation:   Terrance Hawkins is a 33 y.o. male with history of HTN, CHF, Polysubstance use. He presented with shortness of breath. BNP was 384, HS-Troponin was 15,417.  Patient has not taken medications for several years. Chest x-ray: Mild pulmonary edema with diffuse interstitial prominence.  Mild cardiomegaly.  ECHO/ LVEF: 40-45% in 2016.  25-30%- 10/03/2023.  Heart Cath on 10/06/23  Clinical Course:  Past Medical History:  Diagnosis Date   CHF (congestive heart failure) (HCC)    Hypertension      Social History   Socioeconomic History   Marital status: Single    Spouse name: Not on file   Number of children: Not on file   Years of education: Not on file   Highest education level: Not on file  Occupational History   Not on file  Tobacco Use   Smoking status: Every Day    Current packs/day: 0.50    Types: Cigarettes   Smokeless tobacco: Never  Substance and Sexual Activity   Alcohol use: Not Currently    Comment: Pt does drink beer freqently.  No specific amount given.  Just said the whole box.  Could be 6-24 .   Drug use: Yes    Frequency: 4.0 times per week    Types: Marijuana, Cocaine   Sexual activity: Not on file  Other Topics Concern   Not on file  Social History Narrative   Not on file   Social Drivers of Health   Financial Resource Strain: High Risk (10/03/2023)   Overall Financial Resource Strain (CARDIA)    Difficulty of Paying Living Expenses: Hard  Food Insecurity: Unknown (10/03/2023)   Hunger Vital Sign    Worried About Running Out of Food in the Last Year: Never true    Ran Out of Food in the Last Year: Not on file   Transportation Needs: No Transportation Needs (10/03/2023)   PRAPARE - Administrator, Civil Service (Medical): No    Lack of Transportation (Non-Medical): No  Physical Activity: Not on file  Stress: Not on file  Social Connections: Not on file   Education Assessment and Provision:  Detailed education and instructions provided on heart failure disease management including the following:  Signs and symptoms of Heart Failure When to call the physician Importance of daily weights Low sodium diet Fluid restriction Medication management Anticipated future follow-up appointments  Patient education given on each of the above topics.  Patient acknowledges understanding via teach back method and acceptance of all instructions.  Education Materials:  Living Better With Heart Failure Booklet, HF zone tool, & Daily Weight Tracker Tool.  Patient has scale at home: Yes Patient has pill box at home: No .  Thinks he has one but does not use it since he has not been taking his medications.  High Risk Criteria for Readmission and/or Poor Patient Outcomes: Heart failure hospital admissions (last 6 months): 1  No Show rate: 28% Difficult social situation: Polysubstance use Demonstrates medication adherence: No Primary Language: English Literacy level: Reading, Writing, & Comprehension  Barriers of Care:   Polysubstance use Medication Compliance Daily Weights Drinks large amounts of daily fluid  Considerations/Referrals:   Referral made to Heart Failure Pharmacist Stewardship: Yes Referral  made to Heart Failure CSW/NCM TOC: No Referral made to Heart & Vascular TOC clinic: [facility] 10/11/23 @ 11:30.  Items for Follow-up on DC/TOC: Daily Weights Diet & Fluid Restrictions Heart Failure Medications Continued Heart Failure Education Polysubstance Cessation   Charmaine Pines, RN, BSN [facility] Heart Failure Navigator Secure Chat Only

## 2023-10-03 NOTE — ED Notes (Signed)
 Patient provided moistened mouth swabs to patient.

## 2023-10-03 NOTE — ED Notes (Signed)
 Patient called out requesting pillow; pillow provided to patient.

## 2023-10-03 NOTE — Progress Notes (Signed)
 PHARMACY - ANTICOAGULATION CONSULT NOTE  Pharmacy Consult for heparin  Indication: chest pain/ACS  No Known Allergies  Patient Measurements: Height: 5' 7 (170.2 cm) Weight: 120.2 kg (265 lb) IBW/kg (Calculated) : 66.1 HEPARIN  DW (KG): 93.9  Vital Signs: Temp: 98.8 F (37.1 C) (09/09 0658) Temp Source: Oral (09/09 0658) BP: 166/118 (09/09 0800) Pulse Rate: 72 (09/09 0800)  Labs: Recent Labs    10/03/23 0659 10/03/23 0702  HGB 15.7  --   HCT 46.5  --   PLT 156  --   CREATININE  --  0.87  TROPONINIHS  --  15,417*    Estimated Creatinine Clearance: 148.4 mL/min (by C-G formula based on SCr of 0.87 mg/dL).   Medical History: Past Medical History:  Diagnosis Date   CHF (congestive heart failure) (HCC)    Hypertension     Medications:  (Not in a hospital admission)   Assessment: Pharmacy consulted to dose heparin  in patient with chest pain/ACS.  Patient is not on anticoagulation prior to admission.  CBC WNL Trop 15,417  Goal of Therapy:  Heparin  level 0.3-0.7 units/ml Monitor platelets by anticoagulation protocol: Yes   Plan:  Give 4000 units bolus x 1 Start heparin  infusion at 1250 units/hr Check anti-Xa level in 6 hours and daily while on heparin  Continue to monitor H&H and platelets  Elspeth Sour, PharmD Clinical Pharmacist 10/03/2023 8:22 AM

## 2023-10-04 ENCOUNTER — Other Ambulatory Visit (HOSPITAL_COMMUNITY): Payer: Self-pay

## 2023-10-04 ENCOUNTER — Inpatient Hospital Stay

## 2023-10-04 DIAGNOSIS — F1910 Other psychoactive substance abuse, uncomplicated: Secondary | ICD-10-CM

## 2023-10-04 DIAGNOSIS — I5023 Acute on chronic systolic (congestive) heart failure: Secondary | ICD-10-CM | POA: Diagnosis not present

## 2023-10-04 DIAGNOSIS — R0603 Acute respiratory distress: Secondary | ICD-10-CM

## 2023-10-04 DIAGNOSIS — R7989 Other specified abnormal findings of blood chemistry: Secondary | ICD-10-CM | POA: Diagnosis not present

## 2023-10-04 DIAGNOSIS — I214 Non-ST elevation (NSTEMI) myocardial infarction: Secondary | ICD-10-CM | POA: Diagnosis not present

## 2023-10-04 DIAGNOSIS — R0602 Shortness of breath: Secondary | ICD-10-CM

## 2023-10-04 DIAGNOSIS — I110 Hypertensive heart disease with heart failure: Secondary | ICD-10-CM

## 2023-10-04 LAB — TROPONIN I (HIGH SENSITIVITY): Troponin I (High Sensitivity): 9054 ng/L (ref <18)

## 2023-10-04 LAB — BLOOD GAS, ARTERIAL
Acid-Base Excess: 1.7 mmol/L (ref 0.0–2.0)
Bicarbonate: 24.4 mmol/L (ref 20.0–28.0)
O2 Saturation: 90.3 %
Patient temperature: 37
pCO2 arterial: 32 mmHg (ref 32–48)
pH, Arterial: 7.49 — ABNORMAL HIGH (ref 7.35–7.45)
pO2, Arterial: 58 mmHg — ABNORMAL LOW (ref 83–108)

## 2023-10-04 LAB — HEPARIN LEVEL (UNFRACTIONATED)
Heparin Unfractionated: 0.1 [IU]/mL — ABNORMAL LOW (ref 0.30–0.70)
Heparin Unfractionated: 0.14 [IU]/mL — ABNORMAL LOW (ref 0.30–0.70)
Heparin Unfractionated: 0.11 [IU]/mL — ABNORMAL LOW (ref 0.30–0.70)

## 2023-10-04 LAB — ECHOCARDIOGRAM COMPLETE
Area-P 1/2: 4.39 cm2
Height: 67 in
S' Lateral: 5.9 cm
Weight: 4240 [oz_av]

## 2023-10-04 LAB — BASIC METABOLIC PANEL WITH GFR
Anion gap: 10 (ref 5–15)
BUN: 11 mg/dL (ref 6–20)
CO2: 23 mmol/L (ref 22–32)
Calcium: 9 mg/dL (ref 8.9–10.3)
Chloride: 105 mmol/L (ref 98–111)
Creatinine, Ser: 0.73 mg/dL (ref 0.61–1.24)
GFR, Estimated: >60 mL/min (ref >60)
Glucose, Bld: 107 mg/dL — ABNORMAL HIGH (ref 70–99)
Potassium: 3.3 mmol/L — ABNORMAL LOW (ref 3.5–5.1)
Sodium: 138 mmol/L (ref 135–145)

## 2023-10-04 LAB — CBC
HCT: 48.6 % (ref 39.0–52.0)
Hemoglobin: 16.7 g/dL (ref 13.0–17.0)
MCH: 30.2 pg (ref 26.0–34.0)
MCHC: 34.4 g/dL (ref 30.0–36.0)
MCV: 87.9 fL (ref 80.0–100.0)
Platelets: 171 K/uL (ref 150–400)
RBC: 5.53 MIL/uL (ref 4.22–5.81)
RDW: 13.2 % (ref 11.5–15.5)
WBC: 13.2 K/uL — ABNORMAL HIGH (ref 4.0–10.5)
nRBC: 0 % (ref 0.0–0.2)

## 2023-10-04 LAB — HIV ANTIBODY (ROUTINE TESTING W REFLEX): HIV Screen 4th Generation wRfx: NONREACTIVE

## 2023-10-04 MED ORDER — IPRATROPIUM-ALBUTEROL 0.5-2.5 (3) MG/3ML IN SOLN
RESPIRATORY_TRACT | Status: AC
  Filled 2023-10-04: qty 3

## 2023-10-04 MED ORDER — FUROSEMIDE 10 MG/ML IJ SOLN
40.0000 mg | Freq: Once | INTRAMUSCULAR | Status: AC
  Administered 2023-10-04: 40 mg via INTRAVENOUS
  Filled 2023-10-04: qty 4

## 2023-10-04 MED ORDER — SODIUM CHLORIDE 0.9 % IV SOLN: INTRAVENOUS | Status: DC

## 2023-10-04 MED ORDER — SACUBITRIL-VALSARTAN 49-51 MG PO TABS
1.0000 | ORAL_TABLET | Freq: Two times a day (BID) | ORAL | Status: DC
  Administered 2023-10-05 – 2023-10-07 (×5): 1 via ORAL
  Filled 2023-10-04 (×5): qty 1

## 2023-10-04 MED ORDER — IPRATROPIUM-ALBUTEROL 0.5-2.5 (3) MG/3ML IN SOLN
3.0000 mL | Freq: Once | RESPIRATORY_TRACT | Status: AC
  Administered 2023-10-04: 3 mL via RESPIRATORY_TRACT

## 2023-10-04 MED ORDER — GUAIFENESIN ER 600 MG PO TB12
600.0000 mg | ORAL_TABLET | Freq: Two times a day (BID) | ORAL | Status: DC
  Administered 2023-10-04 – 2023-10-07 (×7): 600 mg via ORAL
  Filled 2023-10-04 (×6): qty 1

## 2023-10-04 MED ORDER — ASPIRIN 81 MG PO CHEW: 81.0000 mg | CHEWABLE_TABLET | ORAL | Status: DC

## 2023-10-04 MED ORDER — MORPHINE SULFATE (PF) 2 MG/ML IV SOLN
2.0000 mg | Freq: Once | INTRAVENOUS | Status: AC
  Administered 2023-10-04: 2 mg via INTRAVENOUS
  Filled 2023-10-04: qty 1

## 2023-10-04 MED ORDER — TRAZODONE HCL 50 MG PO TABS
25.0000 mg | ORAL_TABLET | Freq: Every evening | ORAL | Status: DC | PRN
  Administered 2023-10-04 – 2023-10-06 (×4): 25 mg via ORAL
  Filled 2023-10-04 (×4): qty 1

## 2023-10-04 MED ORDER — POTASSIUM CHLORIDE CRYS ER 20 MEQ PO TBCR
40.0000 meq | EXTENDED_RELEASE_TABLET | Freq: Two times a day (BID) | ORAL | Status: AC
  Administered 2023-10-04 (×2): 40 meq via ORAL
  Filled 2023-10-04 (×2): qty 2

## 2023-10-04 MED ORDER — SPIRONOLACTONE 25 MG PO TABS
25.0000 mg | ORAL_TABLET | Freq: Every day | ORAL | Status: DC
  Administered 2023-10-04 – 2023-10-07 (×4): 25 mg via ORAL
  Filled 2023-10-04 (×4): qty 1

## 2023-10-04 MED ORDER — HEPARIN BOLUS VIA INFUSION
4000.0000 [IU] | Freq: Once | INTRAVENOUS | Status: AC
  Administered 2023-10-04: 4000 [IU] via INTRAVENOUS
  Filled 2023-10-04: qty 4000

## 2023-10-04 MED ORDER — HYDROCOD POLI-CHLORPHE POLI ER 10-8 MG/5ML PO SUER
5.0000 mL | Freq: Two times a day (BID) | ORAL | Status: DC | PRN
  Administered 2023-10-04: 5 mL via ORAL
  Filled 2023-10-04: qty 5

## 2023-10-04 MED ORDER — HEPARIN BOLUS VIA INFUSION
2800.0000 [IU] | Freq: Once | INTRAVENOUS | Status: AC
  Administered 2023-10-04: 2800 [IU] via INTRAVENOUS
  Filled 2023-10-04: qty 2800

## 2023-10-04 NOTE — Progress Notes (Signed)
 PHARMACY - ANTICOAGULATION CONSULT NOTE  Pharmacy Consult for heparin  Indication: chest pain/ACS  Allergies  Allergen Reactions   Lisinopril  Other (See Comments)    Patient Measurements: Height: 5' 7 (170.2 cm) Weight: 118.2 kg (260 lb 9.6 oz) IBW/kg (Calculated) : 66.1 HEPARIN  DW (KG): 93.3  Vital Signs: Temp: 99 F (37.2 C) (09/10 0333) Temp Source: Oral (09/09 2358) BP: 154/107 (09/10 0333) Pulse Rate: 84 (09/10 0333)  Labs: Recent Labs    10/03/23 0659 10/03/23 0702 10/03/23 0857 10/03/23 1402 10/03/23 1734 10/04/23 0301  HGB 15.7  --   --   --   --  16.7  HCT 46.5  --   --   --   --  48.6  PLT 156  --   --   --   --  171  HEPARINUNFRC  --   --   --   --  <0.10* 0.10*  CREATININE  --  0.87  --   --   --  0.73  CKTOTAL  --   --  301  --   --   --   TROPONINIHS  --  15,417* 14,248* 14,004*  --   --     Estimated Creatinine Clearance: 159.9 mL/min (by C-G formula based on SCr of 0.73 mg/dL).   Medical History: Past Medical History:  Diagnosis Date   CHF (congestive heart failure) (HCC)    Hypertension     Assessment: Pharmacy consulted to dose heparin  in patient with chest pain/ACS. PMH includes   polysubstance abuse, HFmrEF, hypertension, morbid obesity, and hyperparathyroidism. Patient is not on anticoagulation prior to admission and no compliance with any medications.   Pharmacy consulted to start and manage heparin  infusion for ACS.  CBC WNL Trop 15,417  Date Time Results Comments 9/9 1734 HL < 0.1 1250 units/hour 9/10     0301    HL < 0.1          1600 units/hour  Goal of Therapy:  Heparin  level 0.3-0.7 units/ml Monitor platelets by anticoagulation protocol: Yes   Plan:  9/10:  HL @ 0301 = < 0.1, SUBtherapeutic - will order heparin  2800 units IV X 1 bolus and increase drip rate to 1950 units/hr Check heparin  6 hours after rate change Continue to monitor H&H and platelets  Gable Odonohue D 10/04/2023 3:35 AM

## 2023-10-04 NOTE — Plan of Care (Signed)

## 2023-10-04 NOTE — Progress Notes (Signed)
 PHARMACY - ANTICOAGULATION CONSULT NOTE  Pharmacy Consult for heparin  Indication: chest pain/ACS  Allergies  Allergen Reactions   Lisinopril  Other (See Comments)    Patient Measurements: Height: 5' 7 (170.2 cm) Weight: 119 kg (262 lb 6.4 oz) IBW/kg (Calculated) : 66.1 HEPARIN  DW (KG): 93.3  Vital Signs: Temp: 98.5 F (36.9 C) (09/10 1157) Temp Source: Oral (09/10 1157) BP: 141/109 (09/10 1200) Pulse Rate: 84 (09/10 0333)  Labs: Recent Labs    10/03/23 0659 10/03/23 0702 10/03/23 0702 10/03/23 0857 10/03/23 1402 10/03/23 1734 10/04/23 0301 10/04/23 0610 10/04/23 1147  HGB 15.7  --   --   --   --   --  16.7  --   --   HCT 46.5  --   --   --   --   --  48.6  --   --   PLT 156  --   --   --   --   --  171  --   --   HEPARINUNFRC  --   --   --   --   --  <0.10* 0.10*  --  0.14*  CREATININE  --  0.87  --   --   --   --  0.73  --   --   CKTOTAL  --   --   --  301  --   --   --   --   --   TROPONINIHS  --  15,417*   < > 14,248* 14,004*  --   --  9,054*  --    < > = values in this interval not displayed.    Estimated Creatinine Clearance: 160.7 mL/min (by C-G formula based on SCr of 0.73 mg/dL).   Medical History: Past Medical History:  Diagnosis Date   CHF (congestive heart failure) (HCC)    Hypertension     Assessment: Pharmacy consulted to dose heparin  in patient with chest pain/ACS. PMH includes   polysubstance abuse, HFmrEF, hypertension, morbid obesity, and hyperparathyroidism. Patient is not on anticoagulation prior to admission and no compliance with any medications.   Pharmacy consulted to start and manage heparin  infusion for ACS.  CBC WNL Trop 15,417  Date Time Results Comments 9/9 1734 HL < 0.1 1250 units/hour 9/10     0301    HL < 0.1          1600 units/hour 9/10 1147 HL 0.14.   Goal of Therapy:  Heparin  level 0.3-0.7 units/ml Monitor platelets by anticoagulation protocol: Yes   Plan:  Heparin  level is subtherapeutic. Will give heparin   bolus of 4000 units x 1 and increase heparin  infusion to 2200 units/hr. Recheck heparin  level in 6 hours.CBC daily while on heparin .   Cathaleen GORMAN Blanch, PharmD, BCPS 10/04/2023 1:17 PM

## 2023-10-04 NOTE — Progress Notes (Signed)
 Progress Note   Patient: Terrance Hawkins FMW:[id number] DOB: 10/06/1990 DOA: 10/03/2023     1 DOS: the patient was seen and examined on 10/04/2023   Brief hospital course: 33yo with h/o chronic HFrEF, refractory HTN, polysubstance abuse, and noncompliance with home medications who presented on 9/9 with SOB.  CXR with pulmonary congestion, troponin 15,000 with negative delta.  He was started on heparin  drip and IV Lasix  with cardiology consultation.  Echocardiogram ordered, likely need for cath later this week.  Echo with EF 25-30% with grade 2 DD and concern for inferior WMA (but also with global hypokinesis).  Assessment and Plan:  HTN emergency Medication noncompliance and polysubstance abuse Presented with markedly elevated and signs of endorgan damage of (elevated troponin and CHF) Improving after IV Lasix  Resume small dose of Coreg  and losartan    Acute on chronic HFrEF decompensation Secondary to noncompliant with BP/CHF medications Echo with EF 25-30%, grade 2 DD, and ?inferior WMA IV diuresis Lasix  40 mg twice daily BP control is essential Cardiology is planning a cath, likely 9/12 due to scheduling   Hypokalemia Replete  Polysubstance abuse UDS + THC, cocaine Cessation encouraged  Medical noncompliance Ramifications of not taking chronic controlling medications reviewed and patient voices understanding with importance of taking medications, following up with appointments, etc.   Morbid/class 3 obesity Body mass index is 41.1 kg/m.SABRA  Weight loss should be encouraged Outpatient PCP/bariatric medicine f/u encouraged Significantly low or high BMI is associated with higher medical risk including morbidity and mortality       Consultants: Cardiology  Procedures: Echocardiogram 9/9  Antibiotics: None  30 Day Unplanned Readmission Risk Score    Flowsheet Row ED to Hosp-Admission (Current) from 10/03/2023 in [facility]  30 Day Unplanned  Readmission Risk Score (%) 15.49 Filed at 10/04/2023 0801    This score is the patient's risk of an unplanned readmission within 30 days of being discharged (0 -100%). The score is based on dignosis, age, lab data, medications, orders, and past utilization.   Low:  0-14.9   Medium: 15-21.9   High: 22-29.9   Extreme: 30 and above           Subjective: Feeling ok, was on the phone throughout evaluation.   Objective: Vitals:   10/04/23 1400 10/04/23 1500  BP: (!) 151/96 (!) 153/112  Pulse:    Resp: (!) 35 20  Temp:    SpO2:      Intake/Output Summary (Last 24 hours) at 10/04/2023 1828 Last data filed at 10/04/2023 1554 Gross per 24 hour  Intake 734.49 ml  Output 2400 ml  Net -1665.51 ml   Filed Weights   10/03/23 0657 10/03/23 2210 10/04/23 0500  Weight: 120.2 kg 118.2 kg 119 kg    Exam:  General:  Appears calm and comfortable and is in NAD Eyes:  normal lids, iris ENT:  grossly normal hearing, lips & tongue, mmm Cardiovascular:  RRR. No LE edema.  Respiratory:   CTA bilaterally with no wheezes/rales/rhonchi.  Normal respiratory effort. Abdomen:  soft, NT, ND Skin:  no rash or induration seen on limited exam Musculoskeletal:  grossly normal tone BUE/BLE, good ROM, no bony abnormality Psychiatric:  grossly normal mood and affect, speech fluent and appropriate, AOx3 Neurologic:  CN 2-12 grossly intact, moves all extremities in coordinated fashion  Data Reviewed: I have reviewed the patient's lab results since admission.  Pertinent labs for today include:   ABG: 7.49/32/58/24.4/90.3# K+ 3.3 Glucose 107 HS troponin 84582, 14248,  14004, 9054 WBC 13.2 UDS + for cocaine and THC     Family Communication: None present      Code Status: Full Code  Disposition: Status is: Inpatient Remains inpatient appropriate because: ongoing management     Time spent: 50 minutes  Unresulted Labs (From admission, onward)     Start     Ordered   10/04/23 2000  Heparin   level (unfractionated)  Once-Timed,   TIMED       Question:  Specimen collection method  Answer:  Lab=Lab collect   10/04/23 1320   10/04/23 0500  CBC  Daily,   STAT (with R occurrences)      10/03/23 0826   10/04/23 0500  Basic metabolic panel  Daily,   STAT (with R occurrences)     Comments: As Scheduled for 5 days    10/03/23 1016             Author: Delon Herald, MD 10/04/2023 6:28 PM  For on call review [email or web address].

## 2023-10-04 NOTE — Progress Notes (Signed)
 PHARMACY - ANTICOAGULATION CONSULT NOTE  Pharmacy Consult for heparin  Indication: chest pain/ACS  Allergies  Allergen Reactions   Lisinopril  Other (See Comments)    Patient Measurements: Height: 5' 7 (170.2 cm) Weight: 119 kg (262 lb 6.4 oz) IBW/kg (Calculated) : 66.1 HEPARIN  DW (KG): 93.3  Vital Signs: Temp: 100.3 F (37.9 C) (09/10 1958) Temp Source: Oral (09/10 1958) BP: 153/112 (09/10 1500) Pulse Rate: 103 (09/10 1958)  Labs: Recent Labs    10/03/23 0659 10/03/23 0702 10/03/23 0702 10/03/23 0857 10/03/23 1402 10/03/23 1734 10/04/23 0301 10/04/23 0610 10/04/23 1147 10/04/23 2002  HGB 15.7  --   --   --   --   --  16.7  --   --   --   HCT 46.5  --   --   --   --   --  48.6  --   --   --   PLT 156  --   --   --   --   --  171  --   --   --   HEPARINUNFRC  --   --   --   --   --    < > 0.10*  --  0.14* 0.11*  CREATININE  --  0.87  --   --   --   --  0.73  --   --   --   CKTOTAL  --   --   --  301  --   --   --   --   --   --   TROPONINIHS  --  15,417*   < > 14,248* 14,004*  --   --  9,054*  --   --    < > = values in this interval not displayed.    Estimated Creatinine Clearance: 160.7 mL/min (by C-G formula based on SCr of 0.73 mg/dL).   Medical History: Past Medical History:  Diagnosis Date   CHF (congestive heart failure) (HCC)    Hypertension     Assessment: Pharmacy consulted to dose heparin  in patient with chest pain/ACS. PMH includes   polysubstance abuse, HFmrEF, hypertension, morbid obesity, and hyperparathyroidism. Patient is not on anticoagulation prior to admission and no compliance with any medications.   Pharmacy consulted to start and manage heparin  infusion for ACS.  CBC WNL Trop 15,417  Date Time Results Comments 9/9 1734 HL < 0.1 1250 units/hour 9/10     0301    HL < 0.1          1600 units/hour 9/10 1147 HL 0.14 SUBtherapeutic 9/10 2002 HL 0.11 SUBtherapeutic  Goal of Therapy:  Heparin  level 0.3-0.7 units/ml Monitor platelets  by anticoagulation protocol: Yes   Plan:  Heparin  level is subtherapeutic and lower than before. No interruptions in heparin  infusion per RN. Patient could have antithrombin III deficiency. Will repeat heparin  bolus of 4000 units x 1 and increase heparin  infusion to 2600 units/hr. Recheck heparin  level in 6 hours. CBC daily while on heparin .  Will M. Lenon, PharmD, BCPS Clinical Pharmacist 10/04/2023 9:14 PM

## 2023-10-04 NOTE — Progress Notes (Signed)
 Heart Failure Stewardship Pharmacy Note  PCP: [facility] PCP-Cardiologist: None  HPI: Terrance Hawkins is a 33 y.o. male with history of HTN, CHF, cocaine use, alcohol ase who presented with 1 day history of shortness of breath. On admission, BNP was 384, HS-troponin was 84582 > 14248, AST 91, ALT 45, and UDS positive for cocaine. Chest x-ray noted bilateral opacities.  Patient stopped CHF medications for limited information on their indication.  Pertinent cardiac history: TTE in 08/2014 showed LVEF of 40-45%. Patient was briefly followed by cardiology, then was lost to follow up for several years. TTE 09/2023 showed LVEF reduced to 25-30%, G2DD, normal RV, moderate MR.  Pertinent Lab Values: Creatinine  Date Value Ref Range Status  04/13/2012 0.71 0.60 - 1.30 mg/dL Final   Creatinine, Ser  Date Value Ref Range Status  10/04/2023 0.73 0.61 - 1.24 mg/dL Final   BUN  Date Value Ref Range Status  10/04/2023 11 6 - 20 mg/dL Final  91/84/7980 9 6 - 20 mg/dL Final  96/78/7985 6 (L) 7 - 18 mg/dL Final   Potassium  Date Value Ref Range Status  10/04/2023 3.3 (L) 3.5 - 5.1 mmol/L Final  04/13/2012 3.5 3.5 - 5.1 mmol/L Final   Sodium  Date Value Ref Range Status  10/04/2023 138 135 - 145 mmol/L Final  09/07/2017 143 134 - 144 mmol/L Final  04/13/2012 138 136 - 145 mmol/L Final   B Natriuretic Peptide  Date Value Ref Range Status  10/03/2023 384.0 (H) 0.0 - 100.0 pg/mL Final    Comment:    Performed at [facility], [address] [place], [place] [address]   Magnesium   Date Value Ref Range Status  10/03/2023 1.8 1.7 - 2.4 mg/dL Final    Comment:    Performed at [facility], [address] [place], [place] [address]   Hgb A1c MFr Bld  Date Value Ref Range Status  03/19/2017 5.3 4.8 - 5.6 % Final    Comment:    (NOTE)         Prediabetes: 5.7 - 6.4         Diabetes: >6.4         Glycemic control for adults with diabetes:  <7.0    TSH  Date Value Ref Range Status  03/19/2017 2.520 0.350 - 4.500 uIU/mL Final    Comment:    Performed by a 3rd Generation assay with a functional sensitivity of <=0.01 uIU/mL. Performed at [facility], [address] [place], [place] [address]     Vital Signs: Admission weight: Temp:  [98.1 F (36.7 C)-101.1 F (38.4 C)] 99 F (37.2 C) (09/10 0620) Pulse Rate:  [71-90] 84 (09/10 0333) Cardiac Rhythm: Normal sinus rhythm (09/09 2215) Resp:  [15-41] 41 (09/10 0620) BP: (120-181)/(91-158) 181/107 (09/10 0620) SpO2:  [84 %-100 %] 97 % (09/10 0623) FiO2 (%):  [50 %-55 %] 50 % (09/10 0623) Weight:  [118.2 kg (260 lb 9.6 oz)-119 kg (262 lb 6.4 oz)] 119 kg (262 lb 6.4 oz) (09/10 0500)  Intake/Output Summary (Last 24 hours) at 10/04/2023 0734 Last data filed at 10/04/2023 0540 Gross per 24 hour  Intake 434.49 ml  Output 3250 ml  Net -2815.51 ml    Current Heart Failure Medications:  Loop diuretic: furosemide  40 mg IV BID Beta-Blocker: carvedilol  3.125 mg BID ACEI/ARB/ARNI: Entresto  49-51 mg BID MRA: spironolactone  25 mg daily SGLT2i: none Other: none  Prior to admission Heart Failure Medications:  None  Assessment: 1. 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 NYHA class III-IV symptoms.  -Symptoms: Reports shortness of breath is mildly improved. Reports persistent orthopnea. Denies LEE and abdominal discomfort. Appetite is poor. -Volume: Hypervolemic on exam. JVP appears elevated. Urine is reportedly darker than normal. Currently on furosemide  40 mg IV BID with 3L urine output yesterday. May require increase in dose. -Hemodynamics: BP is elevated. HR 70-90s. No overt signs of low output at this time, but will need to watch closely. LFTs may be up from hepatic congestion or alcohol use. -BB: Currently on carvedilol  3.125 mg BID.  -ACEI/ARB/ARNI: Agree with adding Entresto  49-51 mg  BID. -MRA: Agree with adding spironolactone  25 mg daily -SGLT2i: Consider adding SGLT2i tomorrow if creatinine stable.  Plan: 1) Medication changes recommended at this time: -Agree with changes today  2) Patient assistance: -Patient has medicaid, however, it is not paying at this time because there is an expired primary insurance on file. Will call insurance and attempt to resolve this issue.  3) Education: - Patient has been educated on current HF medications and potential additions to HF medication regimen - Patient verbalizes understanding that over the next few months, these medication doses may change and more medications may be added to optimize HF regimen - Patient has been educated on basic disease state pathophysiology and goals of therapy  Medication Assistance / Insurance Benefits Check: Does the patient have prescription insurance?    Please do not hesitate to reach out with questions or concerns,  Jaun Bash, PharmD, CPP, BCPS, [facility] Heart Failure Pharmacist  Phone - [phone] 10/04/2023 10:10 AM

## 2023-10-04 NOTE — Hospital Course (Addendum)
 33yo with h/o chronic HFrEF, refractory HTN, polysubstance abuse, and noncompliance with home medications who presented on 9/9 with SOB.  CXR with pulmonary congestion, troponin 15,000 with negative delta.  He was started on heparin  drip and IV Lasix  with cardiology consultation.  Echocardiogram ordered, likely need for cath later this week.  Echo with EF 25-30% with grade 2 DD and concern for inferior WMA (but also with global hypokinesis).

## 2023-10-04 NOTE — Progress Notes (Signed)
 Rounding Note   Patient Name: Terrance Hawkins Date of Encounter: 10/04/2023  [facility] Cardiologist: Dr. Medford Hanson, [facility]  Subjective Reports having significant shortness of breath worse when lying supine Restless night of sleep On rounds had breathing treatment in place, facemask Echocardiogram results reviewed with him in detail showing significant drop in ejection fraction 25 to 30%, global hypokinesis with severe inferior wall hypokinesis - Remains on IV Lasix  twice daily -2.8 L past 24 hours, 3.6 L total Stable renal function Potassium repleted  Scheduled Meds:  aspirin  EC  81 mg Oral Daily   atorvastatin   40 mg Oral Daily   carvedilol   3.125 mg Oral BID   fluticasone   2 spray Each Nare Daily   furosemide   40 mg Intravenous BID   guaiFENesin   600 mg Oral BID   loratadine   10 mg Oral Daily   potassium chloride   40 mEq Oral BID   [START ON 10/05/2023] sacubitril -valsartan   1 tablet Oral BID   sodium chloride  flush  3 mL Intravenous Q12H   spironolactone   25 mg Oral Daily   Continuous Infusions:  heparin  1,950 Units/hr (10/04/23 0405)   PRN Meds: acetaminophen , chlorpheniramine-HYDROcodone , cyclobenzaprine , LORazepam , ondansetron  (ZOFRAN ) IV, mouth rinse, sodium chloride  flush, traZODone    Vital Signs  Vitals:   10/04/23 0801 10/04/23 0859 10/04/23 0900 10/04/23 1000  BP:  (!) 150/100 (!) 150/100 (!) 143/102  Pulse:      Resp:  (!) 21 (!) 42 (!) 39  Temp:      TempSrc:      SpO2: 99%     Weight:      Height:        Intake/Output Summary (Last 24 hours) at 10/04/2023 1126 Last data filed at 10/04/2023 1010 Gross per 24 hour  Intake 614.49 ml  Output 2200 ml  Net -1585.51 ml      10/04/2023    5:00 AM 10/03/2023   10:10 PM 10/03/2023    6:57 AM  Last 3 Weights  Weight (lbs) 262 lb 6.4 oz 260 lb 9.6 oz 265 lb  Weight (kg) 119.024 kg 118.207 kg 120.203 kg      Telemetry Normal sinus rhythm- Personally Reviewed  ECG   - Personally  Reviewed  Physical Exam  GEN: No acute distress.   Neck: No JVD Cardiac: RRR, no murmurs, rubs, or gallops.  Respiratory: Clear to auscultation bilaterally. GI: Soft, nontender, non-distended  MS: No edema; No deformity. Neuro:  Nonfocal  Psych: Normal affect   Labs High Sensitivity Troponin:   Recent Labs  Lab 10/03/23 0702 10/03/23 0857 10/03/23 1402 10/04/23 0610  TROPONINIHS 15,417* 14,248* 14,004* 9,054*     Chemistry Recent Labs  Lab 10/03/23 0702 10/03/23 0857 10/04/23 0301  NA 137  --  138  K 3.3*  --  3.3*  CL 104  --  105  CO2 21*  --  23  GLUCOSE 104*  --  107*  BUN 10  --  11  CREATININE 0.87  --  0.73  CALCIUM  8.7*  --  9.0  MG  --  1.8  --   PROT 6.5  --   --   ALBUMIN 3.6  --   --   AST 91*  --   --   ALT 45*  --   --   ALKPHOS 36*  --   --   BILITOT 0.9  --   --   GFRNONAA >60  --  >60  ANIONGAP 12  --  10    Lipids No results for input(s): CHOL, TRIG, HDL, LABVLDL, LDLCALC, CHOLHDL in the last 168 hours.  Hematology Recent Labs  Lab 10/03/23 0659 10/04/23 0301  WBC 12.0* 13.2*  RBC 5.08 5.53  HGB 15.7 16.7  HCT 46.5 48.6  MCV 91.5 87.9  MCH 30.9 30.2  MCHC 33.8 34.4  RDW 13.4 13.2  PLT 156 171   Thyroid No results for input(s): TSH, FREET4 in the last 168 hours.  BNP Recent Labs  Lab 10/03/23 0702  BNP 384.0*    DDimer No results for input(s): DDIMER in the last 168 hours.   Radiology  ECHOCARDIOGRAM COMPLETE Result Date: 10/04/2023    ECHOCARDIOGRAM REPORT   Patient Name:   Terrance Hawkins Date of Exam: 10/03/2023 Medical Rec #:  [id number]          Height:       67.0 in Accession #:    [id number]         Weight:       260.6 lb Date of Birth:  Oct 01, 1990          BSA:          2.263 m Patient Age:    33 years           BP:           148/116 mmHg Patient Gender: M                  HR:           83 bpm. Exam Location:  [facility] Procedure: 2D Echo, Cardiac Doppler and Color Doppler (Both Spectral and Color             Flow Doppler were utilized during procedure). Indications:     I50.21 Acute Systolic CHF  History:         Patient has prior history of Echocardiogram examinations, most                  recent 09/07/2014. CHF; Risk Factors:Hypertension.  Sonographer:     Carl Coma RDCS Referring Phys:  [id number] CORT ONEIDA MANA Diagnosing Phys: Evalene Lunger MD IMPRESSIONS  1. Left ventricular ejection fraction, by estimation, is 25 to 30%. The left ventricle has severely decreased function. The left ventricle demonstrates global hypokinesis with severe inferior wall hypokinesis. Anterior and anteroseptal wall motion best preserved. The left ventricular internal cavity size was moderately dilated. Left ventricular diastolic parameters are consistent with Grade II diastolic dysfunction (pseudonormalization).  2. Right ventricular systolic function is normal. The right ventricular size is normal. Tricuspid regurgitation signal is inadequate for assessing PA pressure.  3. Left atrial size was moderately dilated.  4. The mitral valve is normal in structure. Moderate mitral valve regurgitation. No evidence of mitral stenosis.  5. The aortic valve is normal in structure. Aortic valve regurgitation is not visualized. No aortic stenosis is present.  6. The inferior vena cava is normal in size with <50% respiratory variability, suggesting right atrial pressure of 8 mmHg. FINDINGS  Left Ventricle: Left ventricular ejection fraction, by estimation, is 25 to 30%. The left ventricle has severely decreased function. The left ventricle demonstrates global hypokinesis. Strain was performed and the global longitudinal strain is indeterminate. The left ventricular internal cavity size was moderately dilated. There is no left ventricular hypertrophy. Left ventricular diastolic parameters are consistent with Grade II diastolic dysfunction (pseudonormalization). Right Ventricle: The right ventricular size is normal. No increase in right  ventricular wall thickness.  Right ventricular systolic function is normal. Tricuspid regurgitation signal is inadequate for assessing PA pressure. Left Atrium: Left atrial size was moderately dilated. Right Atrium: Right atrial size was normal in size. Pericardium: There is no evidence of pericardial effusion. Mitral Valve: The mitral valve is normal in structure. There is mild thickening of the mitral valve leaflet(s). There is mild calcification of the mitral valve leaflet(s). Moderate mitral valve regurgitation. No evidence of mitral valve stenosis. Tricuspid Valve: The tricuspid valve is normal in structure. Tricuspid valve regurgitation is mild . No evidence of tricuspid stenosis. Aortic Valve: The aortic valve is normal in structure. Aortic valve regurgitation is not visualized. No aortic stenosis is present. Pulmonic Valve: The pulmonic valve was normal in structure. Pulmonic valve regurgitation is not visualized. No evidence of pulmonic stenosis. Aorta: The aortic root is normal in size and structure. Venous: The inferior vena cava is normal in size with less than 50% respiratory variability, suggesting right atrial pressure of 8 mmHg. IAS/Shunts: No atrial level shunt detected by color flow Doppler. Additional Comments: 3D was performed not requiring image post processing on an independent workstation and was indeterminate.  LEFT VENTRICLE PLAX 2D LVIDd:         6.70 cm   Diastology LVIDs:         5.90 cm   LV e' medial:    6.31 cm/s LV PW:         1.30 cm   LV E/e' medial:  14.8 LV IVS:        1.00 cm   LV e' lateral:   6.85 cm/s LVOT diam:     2.30 cm   LV E/e' lateral: 13.6 LV SV:         49 LV SV Index:   22 LVOT Area:     4.15 cm  RIGHT VENTRICLE             IVC RV Basal diam:  4.10 cm     IVC diam: 1.90 cm RV S prime:     21.60 cm/s TAPSE (M-mode): 3.4 cm LEFT ATRIUM              Index        RIGHT ATRIUM           Index LA diam:        5.80 cm  2.56 cm/m   RA Area:     16.80 cm LA Vol (A2C):    104.0 ml 45.96 ml/m  RA Volume:   49.10 ml  21.70 ml/m LA Vol (A4C):   108.0 ml 47.72 ml/m LA Biplane Vol: 109.0 ml 48.17 ml/m  AORTIC VALVE LVOT Vmax:   69.50 cm/s LVOT Vmean:  61.000 cm/s LVOT VTI:    0.118 m  AORTA Ao Root diam: 3.70 cm Ao Asc diam:  3.70 cm MITRAL VALVE MV Area (PHT): 4.39 cm    SHUNTS MV Decel Time: 173 msec    Systemic VTI:  0.12 m MV E velocity: 93.30 cm/s  Systemic Diam: 2.30 cm MV A velocity: 68.10 cm/s MV E/A ratio:  1.37 Evalene Lunger MD Electronically signed by Evalene Lunger MD Signature Date/Time: 10/04/2023/7:52:37 AM    Final    DG Chest 1 View Result Date: 10/04/2023 EXAM: 1 VIEW XRAY OF THE CHEST 10/04/2023 06:31:33 AM COMPARISON: 10/03/2023 CLINICAL HISTORY: CHF (congestive heart failure) (HCC) 02706. Congestive heart failure, sob FINDINGS: LUNGS AND PLEURA: Bilateral interstitial and airspace opacities, right greater than left, progressed from prior. Possible left  pleural effusion. HEART AND MEDIASTINUM: Stable mild cardiomegaly. BONES AND SOFT TISSUES: No acute osseous abnormality. IMPRESSION: 1. Bilateral interstitial and airspace opacities, right greater than left, progressed from prior. In the setting of chf, imaging findings are favored to represent interstitial and alveolar edema. Underlying infection cannot be excluded. 2. Possible left pleural effusion. 3. Stable mild cardiomegaly. Electronically signed by: Waddell Calk MD 10/04/2023 06:39 AM EDT RP Workstation: [id number]   CT Angio Chest PE W/Cm &/Or Wo Cm Result Date: 10/03/2023 CLINICAL DATA:  Pulmonary embolism (PE) suspected, high prob Flu-like symptoms since yesterday. Worsening shortness of breath. History of congestive heart EXAM: CT ANGIOGRAPHY CHEST WITH CONTRAST TECHNIQUE: Multidetector CT imaging of the chest was performed using the standard protocol during bolus administration of intravenous contrast. Multiplanar CT image reconstructions and MIPs were obtained to evaluate the vascular anatomy.  RADIATION DOSE REDUCTION: This exam was performed according to the departmental dose-optimization program which includes automated exposure control, adjustment of the mA and/or kV according to patient size and/or use of iterative reconstruction technique. CONTRAST:  75mL OMNIPAQUE  IOHEXOL  350 MG/ML SOLN COMPARISON:  Same day radiographs.  Chest CTA 08/12/2014. FINDINGS: Cardiovascular: The pulmonary arteries are suboptimally opacified with contrast to the level of the segmental branches. There is no evidence of acute pulmonary embolism. Assessment beyond the segmental level is limited. No acute or significant systemic arterial abnormalities are identified. The heart is mildly enlarged. No significant pericardial fluid. Mediastinum/Nodes: Mildly prominent mediastinal and hilar lymph nodes are similar to the previous study, likely reactive. No axillary lymphadenopathy. The thyroid gland, trachea and esophagus demonstrate no significant findings. Lungs/Pleura: Trace bilateral pleural effusions. No pneumothorax. Pulmonary assessment limited by breathing artifact. As seen on earlier radiographs, there are patchy and confluent ground-glass opacities in both lungs, most consistent with pulmonary edema. There are ill-defined nodular components in the right upper lobe measuring up to 9 mm on image 45/6. Upper abdomen: No significant findings are seen within the visualized upper abdomen. Musculoskeletal/Chest wall: There is no chest wall mass or suspicious osseous finding. Cervicothoracic scoliosis and mild spondylosis noted. Review of the MIP images confirms the above findings. IMPRESSION: 1. No evidence of acute pulmonary embolism or other acute vascular findings in the chest. 2. Patchy and confluent ground-glass opacities in both lungs, most consistent with pulmonary edema or atypical infection. There are ill-defined nodular components in the right upper lobe measuring up to 9 mm which could reflect areas of focal  inflammation. Given the patient's age, no specific follow-up imaging is recommended unless clinically warranted. 3. Trace bilateral pleural effusions. 4. Mildly prominent mediastinal and hilar lymph nodes, similar to previous study and likely reactive. Electronically Signed   By: Elsie Perone M.D.   On: 10/03/2023 09:20   DG Chest 2 View Result Date: 10/03/2023 EXAM: 2 VIEW(S) XRAY OF THE CHEST 10/03/2023 07:17:16 AM COMPARISON: None available. CLINICAL HISTORY: SOB. Pt reports yesterday he developed flu like symptoms, pt reports while trying to lay down last night his SOB got worse. Pt reports he has hx CHF. FINDINGS: LUNGS AND PLEURA: Bilateral interstitial prominence. Mild pulmonary edema. No focal pulmonary opacity. No pleural effusion. No pneumothorax. HEART AND MEDIASTINUM: Mild cardiomegaly. No acute abnormality of the mediastinal silhouette. BONES AND SOFT TISSUES: No acute osseous abnormality. IMPRESSION: 1. Mild pulmonary edema with diffuse interstitial prominence. 2. Mild cardiomegaly. Electronically signed by: Waddell Calk MD 10/03/2023 07:38 AM EDT RP Workstation: [id number]    Patient Profile   Mr. Terrance Hawkins is a 33 year old man with history  of heart failure with mildly reduced ejection fraction based on echo in 2019 (etiology unclear), hypertension, hyperparathyroidism, ongoing polysubstance abuse, and morbid obesity, whom we have been asked to see due to shortness of breath and elevated troponin.   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  noncompliance with medications, polysubstance abuse, poorly controlled hypertension, no prior ischemic evaluation echo 08/2014 showed EF 40 to 45% with mild LVH - Presenting with shortness of breath and orthopnea - BNP 384 -So far has been treated with IV Lasix  twice daily, stable renal function -Cocaine positive daily, several times per week -Echocardiogram detailing significant drop in ejection  fraction down to 25 to 30% with concern for severe inferior wall hypokinesis but overall global hypokinesis - Continued shortness of breath, uncomfortable lying supine requiring breathing treatments, supplemental oxygen - Recommend we continue IV Lasix  twice daily On low-dose carvedilol , Entresto  49/51 mg twice daily, add spironolactone  25 daily - Continue diuresis today, tomorrow with plan for right and left heart catheterization September 12 .  Delay in catheterization secondary to cath scheduling and will try to achieve more euvolemic state prior to procedure  Elevated troponin - Troponin 15,417 on admission  - on IV heparin   - Echocardiogram with significant drop in ejection fraction 25 to 30%, global hypokinesis with severe wall motion abnormality inferior wall concerning for ischemia - Plan for right and left heart catheterization September 12  Polysubstance abuse  - Positive for cocaine and tobacco products - Cessation recommended   Hypertensive heart disease with CHF - Continue low-dose carvedilol , Entresto , spironolactone  added - Continue IV Lasix  twice daily - Consider addition of Jardiance /Farxiga and further titration up on Entresto  as blood pressure tolerates    For questions or updates, please contact [facility] Please consult [email or web address] for contact info under    Signed, Velinda Lunger, MD, Ph.D [facility]

## 2023-10-04 NOTE — Progress Notes (Signed)
@  approx. 73, this RN called to pt's room with complaints of coughing up blood. Upon inspection, pink-tinged, frothy sputum observed in conjunction with increased tachypnea(40s) and work of breathing. SpO2 evaluated and 80s on 2L [place]. Ultimately titrated to 55% Venti-Mask. Dr. Inga paged regarding above and orders received for ABG, STAT chest xray, nebs, morphine , IV lasix , and additional PRNs. Orders implemented and pt endorsed some relief of symptoms. Will monitor closely and update Day RN.

## 2023-10-05 ENCOUNTER — Encounter
Admission: EM | Disposition: A | Discharge status: Home / Self Care | Payer: Self-pay | Source: Home / Self Care | Attending: Internal Medicine

## 2023-10-05 DIAGNOSIS — R0602 Shortness of breath: Secondary | ICD-10-CM

## 2023-10-05 DIAGNOSIS — I110 Hypertensive heart disease with heart failure: Secondary | ICD-10-CM

## 2023-10-05 DIAGNOSIS — J810 Acute pulmonary edema: Secondary | ICD-10-CM

## 2023-10-05 DIAGNOSIS — I420 Dilated cardiomyopathy: Secondary | ICD-10-CM

## 2023-10-05 DIAGNOSIS — R0603 Acute respiratory distress: Secondary | ICD-10-CM | POA: Diagnosis not present

## 2023-10-05 DIAGNOSIS — I214 Non-ST elevation (NSTEMI) myocardial infarction: Secondary | ICD-10-CM | POA: Diagnosis not present

## 2023-10-05 DIAGNOSIS — R7989 Other specified abnormal findings of blood chemistry: Secondary | ICD-10-CM | POA: Diagnosis not present

## 2023-10-05 DIAGNOSIS — I5023 Acute on chronic systolic (congestive) heart failure: Secondary | ICD-10-CM | POA: Diagnosis not present

## 2023-10-05 DIAGNOSIS — F1910 Other psychoactive substance abuse, uncomplicated: Secondary | ICD-10-CM

## 2023-10-05 LAB — BASIC METABOLIC PANEL WITH GFR
Anion gap: 11 (ref 5–15)
BUN: 17 mg/dL (ref 6–20)
CO2: 23 mmol/L (ref 22–32)
Calcium: 8.9 mg/dL (ref 8.9–10.3)
Chloride: 103 mmol/L (ref 98–111)
Creatinine, Ser: 0.85 mg/dL (ref 0.61–1.24)
GFR, Estimated: >60 mL/min (ref >60)
Glucose, Bld: 103 mg/dL — ABNORMAL HIGH (ref 70–99)
Potassium: 3.9 mmol/L (ref 3.5–5.1)
Sodium: 137 mmol/L (ref 135–145)

## 2023-10-05 LAB — CBC
HCT: 47 % (ref 39.0–52.0)
Hemoglobin: 15.9 g/dL (ref 13.0–17.0)
MCH: 30.3 pg (ref 26.0–34.0)
MCHC: 33.8 g/dL (ref 30.0–36.0)
MCV: 89.5 fL (ref 80.0–100.0)
Platelets: 163 K/uL (ref 150–400)
RBC: 5.25 MIL/uL (ref 4.22–5.81)
RDW: 13 % (ref 11.5–15.5)
WBC: 13.3 K/uL — ABNORMAL HIGH (ref 4.0–10.5)
nRBC: 0 % (ref 0.0–0.2)

## 2023-10-05 LAB — HEPARIN LEVEL (UNFRACTIONATED)
Heparin Unfractionated: 0.13 [IU]/mL — ABNORMAL LOW (ref 0.30–0.70)
Heparin Unfractionated: 0.29 [IU]/mL — ABNORMAL LOW (ref 0.30–0.70)
Heparin Unfractionated: 0.29 [IU]/mL — ABNORMAL LOW (ref 0.30–0.70)

## 2023-10-05 SURGERY — RIGHT/LEFT HEART CATH AND CORONARY ANGIOGRAPHY
Anesthesia: Moderate Sedation

## 2023-10-05 MED ORDER — HEPARIN BOLUS VIA INFUSION
1400.0000 [IU] | Freq: Once | INTRAVENOUS | Status: AC
  Administered 2023-10-05: 1400 [IU] via INTRAVENOUS
  Filled 2023-10-05: qty 1400

## 2023-10-05 MED ORDER — SODIUM CHLORIDE 0.9 % IV SOLN
2.0000 g | INTRAVENOUS | Status: DC
  Administered 2023-10-05 – 2023-10-07 (×3): 2 g via INTRAVENOUS
  Filled 2023-10-05 (×3): qty 20

## 2023-10-05 MED ORDER — AZITHROMYCIN 250 MG PO TABS
500.0000 mg | ORAL_TABLET | Freq: Every day | ORAL | Status: DC
  Administered 2023-10-05 – 2023-10-07 (×3): 500 mg via ORAL
  Filled 2023-10-05 (×3): qty 2

## 2023-10-05 MED ORDER — EMPAGLIFLOZIN 10 MG PO TABS
10.0000 mg | ORAL_TABLET | Freq: Every day | ORAL | Status: DC
  Administered 2023-10-05 – 2023-10-07 (×3): 10 mg via ORAL
  Filled 2023-10-05 (×3): qty 1

## 2023-10-05 MED ORDER — HEPARIN BOLUS VIA INFUSION
2800.0000 [IU] | Freq: Once | INTRAVENOUS | Status: AC
  Administered 2023-10-05: 2800 [IU] via INTRAVENOUS
  Filled 2023-10-05: qty 2800

## 2023-10-05 NOTE — Progress Notes (Signed)
 Heart Failure Stewardship Pharmacy Note  PCP: [facility] PCP-Cardiologist: None  HPI: ROBERTA Hawkins is a 33 y.o. male with history of HTN, CHF, cocaine use, alcohol ase who presented with 1 day history of shortness of breath. On admission, BNP was 384, HS-troponin was 84582 > 14248, AST 91, ALT 45, and UDS positive for cocaine. Chest x-ray noted bilateral opacities.  Patient stopped CHF medications for limited information on their indication.  Pertinent cardiac history: TTE in 08/2014 showed LVEF of 40-45%. Patient was briefly followed by cardiology, then was lost to follow up for several years. TTE 09/2023 showed LVEF reduced to 25-30%, G2DD, normal RV, moderate MR.  Pertinent Lab Values: Creatinine  Date Value Ref Range Status  04/13/2012 0.71 0.60 - 1.30 mg/dL Final   Creatinine, Ser  Date Value Ref Range Status  10/05/2023 0.85 0.61 - 1.24 mg/dL Final   BUN  Date Value Ref Range Status  10/05/2023 17 6 - 20 mg/dL Final  91/84/7980 9 6 - 20 mg/dL Final  96/78/7985 6 (L) 7 - 18 mg/dL Final   Potassium  Date Value Ref Range Status  10/05/2023 3.9 3.5 - 5.1 mmol/L Final  04/13/2012 3.5 3.5 - 5.1 mmol/L Final   Sodium  Date Value Ref Range Status  10/05/2023 137 135 - 145 mmol/L Final  09/07/2017 143 134 - 144 mmol/L Final  04/13/2012 138 136 - 145 mmol/L Final   B Natriuretic Peptide  Date Value Ref Range Status  10/03/2023 384.0 (H) 0.0 - 100.0 pg/mL Final    Comment:    Performed at [facility], [address] [place], [place] [address]   Magnesium   Date Value Ref Range Status  10/03/2023 1.8 1.7 - 2.4 mg/dL Final    Comment:    Performed at [facility], [address] [place], [place] [address]   Hgb A1c MFr Bld  Date Value Ref Range Status  03/19/2017 5.3 4.8 - 5.6 % Final    Comment:    (NOTE)         Prediabetes: 5.7 - 6.4         Diabetes: >6.4         Glycemic control for adults with diabetes:  <7.0    TSH  Date Value Ref Range Status  03/19/2017 2.520 0.350 - 4.500 uIU/mL Final    Comment:    Performed by a 3rd Generation assay with a functional sensitivity of <=0.01 uIU/mL. Performed at [facility], [address] [place], [place] [address]     Vital Signs: Temp:  [98.5 F (36.9 C)-100.3 F (37.9 C)] 98.5 F (36.9 C) (09/11 0400) Pulse Rate:  [81-103] 81 (09/11 0400) Cardiac Rhythm: Normal sinus rhythm;Bundle branch block (09/10 2134) Resp:  [18-44] 19 (09/11 0400) BP: (125-173)/(88-125) 145/109 (09/11 0400) SpO2:  [97 %-100 %] 100 % (09/11 0400) FiO2 (%):  [50 %] 50 % (09/10 0801) Weight:  [115.5 kg (254 lb 10.1 oz)] 115.5 kg (254 lb 10.1 oz) (09/11 0500)  Intake/Output Summary (Last 24 hours) at 10/05/2023 0752 Last data filed at 10/05/2023 0100 Gross per 24 hour  Intake 420 ml  Output 3800 ml  Net -3380 ml    Current Heart Failure Medications:  Loop diuretic: furosemide  40 mg IV BID Beta-Blocker: carvedilol  3.125 mg BID ACEI/ARB/ARNI: Entresto  49-51 mg BID MRA: spironolactone  25 mg daily SGLT2i: none Other: none  Prior to admission Heart Failure Medications:  None  Assessment: 1. 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 NYHA class III-IV symptoms.  -Symptoms: Reports shortness of breath is mildly improved. Reports persistent orthopnea. Appetite is poor. States he feels dehydrated and anxious. -Volume: Volume difficult to assess due to body habitus. Patient reports dehydration and urine color is dark. Currently on furosemide  40 mg IV BID with 3L urine output yesterday. Will get ReDs to evaluate fluid status. -Hemodynamics: BP is elevated. HR 70-90s.  -BB: Continue carvedilol  3.125 mg BID.   -ACEI/ARB/ARNI: Agree with adding Entresto  49-51 mg BID. -MRA: Continue spironolactone  25 mg daily -SGLT2i: Consider adding SGLT2i if euvolemic or hypervolemic. Would hold if  dry.  Plan: 1) Medication changes recommended at this time: -None. Follow up ReDs  2) Patient assistance: -Patient has medicaid, however, it is not paying at this time because there is an expired primary insurance on file. Will call insurance and attempt to resolve this issue.  3) Education: - Patient has been educated on current HF medications and potential additions to HF medication regimen - Patient verbalizes understanding that over the next few months, these medication doses may change and more medications may be added to optimize HF regimen - Patient has been educated on basic disease state pathophysiology and goals of therapy  Medication Assistance / Insurance Benefits Check: Does the patient have prescription insurance?    Please do not hesitate to reach out with questions or concerns,  Terrance Hawkins, PharmD, CPP, BCPS, [facility] Heart Failure Pharmacist  Phone - [phone] 10/05/2023 7:52 AM

## 2023-10-05 NOTE — TOC CM/SW Note (Signed)
 Transition of Care [facility]) - Inpatient Brief Assessment   Patient Details  Name: Terrance Hawkins MRN: [id number] Date of Birth: 08/04/1990  [facility] of [facility]) CM/SW Contact:    Lauraine JAYSON Carpen, LCSW Phone Number: 10/05/2023, 3:35 PM   Clinical Narrative: CSW reviewed chart. Patient is currently on acute oxygen. Will follow for this potential discharge need. No other TOC needs identified so far. Please place [facility] consult if any other needs arise.  Transition of Care Asessment: Insurance and Status: Insurance coverage has been reviewed Patient has primary care physician: Yes Home environment has been reviewed: Single family home Prior level of function:: Not documented Prior/Current Home Services: No current home services Social Drivers of Health Review: SDOH reviewed no interventions necessary Readmission risk has been reviewed: Yes Transition of care needs: transition of care needs identified, TOC will continue to follow

## 2023-10-05 NOTE — Progress Notes (Signed)
 ReDS Vest / Clip - 10/05/23 0900       ReDS Vest / Clip   Station Marker D    Anatomical Comments Attempted 4 times with repostioning.  Patietn was Low Quality each time.          Charmaine Pines, RN, BSN [facility] Heart Failure Navigator Secure Chat Only

## 2023-10-05 NOTE — H&P (View-Only) (Signed)
 Rounding Note   Patient Name: Terrance Hawkins Date of Encounter: 10/05/2023  [facility] Cardiologist: Dr. Medford Hanson, [facility]  Subjective  No family at the bedside for discussion  Frustrated by wearing face mask for oxygen supplement States that he has sleep apnea, has not been wearing CPAP in the hospital.  Reports he is going through testing as outpatient for CPAP titration  Feels he only started to urinate yesterday after drinking more water  -3.5 L negative past 24 hours, -6 L total Still very short of breath lying supine, has to sit up  Echocardiogram results discussed with him in detail showing significant drop in ejection fraction 25 to 30%, global hypokinesis with severe inferior wall hypokinesis   Scheduled Meds:  aspirin  EC  81 mg Oral Daily   atorvastatin   40 mg Oral Daily   azithromycin   500 mg Oral Daily   carvedilol   3.125 mg Oral BID   empagliflozin   10 mg Oral Daily   fluticasone   2 spray Each Nare Daily   furosemide   40 mg Intravenous BID   guaiFENesin   600 mg Oral BID   loratadine   10 mg Oral Daily   sacubitril -valsartan   1 tablet Oral BID   sodium chloride  flush  3 mL Intravenous Q12H   spironolactone   25 mg Oral Daily   Continuous Infusions:  sodium chloride      cefTRIAXone  (ROCEPHIN )  IV 2 g (10/05/23 1008)   heparin  2,500 Units/hr (10/05/23 0544)   PRN Meds: acetaminophen , chlorpheniramine-HYDROcodone , cyclobenzaprine , LORazepam , ondansetron  (ZOFRAN ) IV, mouth rinse, sodium chloride  flush, traZODone    Vital Signs  Vitals:   10/05/23 0400 10/05/23 0500 10/05/23 0757 10/05/23 0900  BP: (!) 145/109  (!) 141/98   Pulse: 81  93   Resp: 19     Temp: 98.5 F (36.9 C)  (!) 97.3 F (36.3 C)   TempSrc:   Oral   SpO2: 100%  98%   Weight:  115.5 kg  115.2 kg  Height:    5' 7 (1.702 m)    Intake/Output Summary (Last 24 hours) at 10/05/2023 1136 Last data filed at 10/05/2023 1100 Gross per 24 hour  Intake 480 ml  Output 4100  ml  Net -3620 ml      10/05/2023    9:00 AM 10/05/2023    5:00 AM 10/04/2023    5:00 AM  Last 3 Weights  Weight (lbs) 254 lb 254 lb 10.1 oz 262 lb 6.4 oz  Weight (kg) 115.214 kg 115.5 kg 119.024 kg      Telemetry Normal sinus rhythm- Personally Reviewed  ECG   - Personally Reviewed  Physical Exam  Constitutional:  oriented to person, place, and time. No distress.  HENT:  Head: Normocephalic and atraumatic.  Eyes:  no discharge. No scleral icterus.  Neck: Normal range of motion. Neck supple.  JVD 10+  present.  Cardiovascular: Normal rate, regular rhythm, normal heart sounds and intact distal pulses. Exam reveals no gallop and no friction rub. No edema No murmur heard. Pulmonary/Chest: Scattered Rales no stridor. No respiratory distress.  no wheezes.    no tenderness.  Abdominal: Soft.  no distension.  no tenderness.  Musculoskeletal: Normal range of motion.  no  tenderness or deformity.  Neurological:  normal muscle tone. Coordination normal. No atrophy Skin: Skin is warm and dry. No rash noted. not diaphoretic.  Psychiatric:  normal mood and affect. behavior is normal. Thought content normal.   Labs High Sensitivity Troponin:   Recent Labs  Lab  10/03/23 0702 10/03/23 0857 10/03/23 1402 10/04/23 0610  TROPONINIHS 15,417* 14,248* 14,004* 9,054*     Chemistry Recent Labs  Lab 10/03/23 0702 10/03/23 0857 10/04/23 0301 10/05/23 0412  NA 137  --  138 137  K 3.3*  --  3.3* 3.9  CL 104  --  105 103  CO2 21*  --  23 23  GLUCOSE 104*  --  107* 103*  BUN 10  --  11 17  CREATININE 0.87  --  0.73 0.85  CALCIUM  8.7*  --  9.0 8.9  MG  --  1.8  --   --   PROT 6.5  --   --   --   ALBUMIN 3.6  --   --   --   AST 91*  --   --   --   ALT 45*  --   --   --   ALKPHOS 36*  --   --   --   BILITOT 0.9  --   --   --   GFRNONAA >60  --  >60 >60  ANIONGAP 12  --  10 11    Lipids No results for input(s): CHOL, TRIG, HDL, LABVLDL, LDLCALC, CHOLHDL in the last 168  hours.  Hematology Recent Labs  Lab 10/03/23 0659 10/04/23 0301 10/05/23 0412  WBC 12.0* 13.2* 13.3*  RBC 5.08 5.53 5.25  HGB 15.7 16.7 15.9  HCT 46.5 48.6 47.0  MCV 91.5 87.9 89.5  MCH 30.9 30.2 30.3  MCHC 33.8 34.4 33.8  RDW 13.4 13.2 13.0  PLT 156 171 163   Thyroid No results for input(s): TSH, FREET4 in the last 168 hours.  BNP Recent Labs  Lab 10/03/23 0702  BNP 384.0*    DDimer No results for input(s): DDIMER in the last 168 hours.   Radiology  ECHOCARDIOGRAM COMPLETE Result Date: 10/04/2023    ECHOCARDIOGRAM REPORT   Patient Name:   Terrance Hawkins Date of Exam: 10/03/2023 Medical Rec #:  [id number]          Height:       67.0 in Accession #:    [id number]         Weight:       260.6 lb Date of Birth:  [birth year]          BSA:          2.263 m Patient Age:    33 years           BP:           148/116 mmHg Patient Gender: M                  HR:           83 bpm. Exam Location:  [facility] Procedure: 2D Echo, Cardiac Doppler and Color Doppler (Both Spectral and Color            Flow Doppler were utilized during procedure). Indications:     I50.21 Acute Systolic CHF  History:         Patient has prior history of Echocardiogram examinations, most                  recent 09/07/2014. CHF; Risk Factors:Hypertension.  Sonographer:     Carl Coma RDCS Referring Phys:  [id number] CORT ONEIDA MANA Diagnosing Phys: Evalene Lunger MD IMPRESSIONS  1. Left ventricular ejection fraction, by estimation, is 25 to 30%. The left ventricle has severely decreased function. The left ventricle demonstrates  global hypokinesis with severe inferior wall hypokinesis. Anterior and anteroseptal wall motion best preserved. The left ventricular internal cavity size was moderately dilated. Left ventricular diastolic parameters are consistent with Grade II diastolic dysfunction (pseudonormalization).  2. Right ventricular systolic function is normal. The right ventricular size is normal. Tricuspid regurgitation  signal is inadequate for assessing PA pressure.  3. Left atrial size was moderately dilated.  4. The mitral valve is normal in structure. Moderate mitral valve regurgitation. No evidence of mitral stenosis.  5. The aortic valve is normal in structure. Aortic valve regurgitation is not visualized. No aortic stenosis is present.  6. The inferior vena cava is normal in size with <50% respiratory variability, suggesting right atrial pressure of 8 mmHg. FINDINGS  Left Ventricle: Left ventricular ejection fraction, by estimation, is 25 to 30%. The left ventricle has severely decreased function. The left ventricle demonstrates global hypokinesis. Strain was performed and the global longitudinal strain is indeterminate. The left ventricular internal cavity size was moderately dilated. There is no left ventricular hypertrophy. Left ventricular diastolic parameters are consistent with Grade II diastolic dysfunction (pseudonormalization). Right Ventricle: The right ventricular size is normal. No increase in right ventricular wall thickness. Right ventricular systolic function is normal. Tricuspid regurgitation signal is inadequate for assessing PA pressure. Left Atrium: Left atrial size was moderately dilated. Right Atrium: Right atrial size was normal in size. Pericardium: There is no evidence of pericardial effusion. Mitral Valve: The mitral valve is normal in structure. There is mild thickening of the mitral valve leaflet(s). There is mild calcification of the mitral valve leaflet(s). Moderate mitral valve regurgitation. No evidence of mitral valve stenosis. Tricuspid Valve: The tricuspid valve is normal in structure. Tricuspid valve regurgitation is mild . No evidence of tricuspid stenosis. Aortic Valve: The aortic valve is normal in structure. Aortic valve regurgitation is not visualized. No aortic stenosis is present. Pulmonic Valve: The pulmonic valve was normal in structure. Pulmonic valve regurgitation is not  visualized. No evidence of pulmonic stenosis. Aorta: The aortic root is normal in size and structure. Venous: The inferior vena cava is normal in size with less than 50% respiratory variability, suggesting right atrial pressure of 8 mmHg. IAS/Shunts: No atrial level shunt detected by color flow Doppler. Additional Comments: 3D was performed not requiring image post processing on an independent workstation and was indeterminate.  LEFT VENTRICLE PLAX 2D LVIDd:         6.70 cm   Diastology LVIDs:         5.90 cm   LV e' medial:    6.31 cm/s LV PW:         1.30 cm   LV E/e' medial:  14.8 LV IVS:        1.00 cm   LV e' lateral:   6.85 cm/s LVOT diam:     2.30 cm   LV E/e' lateral: 13.6 LV SV:         49 LV SV Index:   22 LVOT Area:     4.15 cm  RIGHT VENTRICLE             IVC RV Basal diam:  4.10 cm     IVC diam: 1.90 cm RV S prime:     21.60 cm/s TAPSE (M-mode): 3.4 cm LEFT ATRIUM              Index        RIGHT ATRIUM           Index LA  diam:        5.80 cm  2.56 cm/m   RA Area:     16.80 cm LA Vol (A2C):   104.0 ml 45.96 ml/m  RA Volume:   49.10 ml  21.70 ml/m LA Vol (A4C):   108.0 ml 47.72 ml/m LA Biplane Vol: 109.0 ml 48.17 ml/m  AORTIC VALVE LVOT Vmax:   69.50 cm/s LVOT Vmean:  61.000 cm/s LVOT VTI:    0.118 m  AORTA Ao Root diam: 3.70 cm Ao Asc diam:  3.70 cm MITRAL VALVE MV Area (PHT): 4.39 cm    SHUNTS MV Decel Time: 173 msec    Systemic VTI:  0.12 m MV E velocity: 93.30 cm/s  Systemic Diam: 2.30 cm MV A velocity: 68.10 cm/s MV E/A ratio:  1.37 Evalene Lunger MD Electronically signed by Evalene Lunger MD Signature Date/Time: 10/04/2023/7:52:37 AM    Final    DG Chest 1 View Result Date: 10/04/2023 EXAM: 1 VIEW XRAY OF THE CHEST 10/04/2023 06:31:33 AM COMPARISON: 10/03/2023 CLINICAL HISTORY: CHF (congestive heart failure) (HCC) 02706. Congestive heart failure, sob FINDINGS: LUNGS AND PLEURA: Bilateral interstitial and airspace opacities, right greater than left, progressed from prior. Possible left  pleural effusion. HEART AND MEDIASTINUM: Stable mild cardiomegaly. BONES AND SOFT TISSUES: No acute osseous abnormality. IMPRESSION: 1. Bilateral interstitial and airspace opacities, right greater than left, progressed from prior. In the setting of chf, imaging findings are favored to represent interstitial and alveolar edema. Underlying infection cannot be excluded. 2. Possible left pleural effusion. 3. Stable mild cardiomegaly. Electronically signed by: Waddell Calk MD 10/04/2023 06:39 AM EDT RP Workstation: [id number]    Patient Profile   Mr. Terrance Hawkins is a 33 year old man with history of heart failure with mildly reduced ejection fraction based on echo in 2019 (etiology unclear), hypertension, hyperparathyroidism, ongoing polysubstance abuse, and morbid obesity, whom we have been asked to see due to shortness of breath and elevated troponin.   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  noncompliance with medications, polysubstance abuse, poorly controlled hypertension, no prior ischemic evaluation echo 08/2014 showed EF 40 to 45% with mild LVH -This admission with worsening shortness of breath, orthopnea - BNP 384, chest x-ray with pulmonary edema -Responding well to IV Lasix  twice daily -Cocaine positive, sometimes uses daily, often several times per week -Echocardiogram EF 25 to 30% with severe inferior wall hypokinesis,  global hypokinesis -Chest x-ray yesterday with worsening opacities on the right since admission, continued orthopnea, scant discolored sputum - We have recommended he continue IV Lasix  twice daily On low-dose carvedilol , Entresto  49/51 mg twice daily, spironolactone  25 daily Add Jardiance  10 mg daily  Elevated troponin - Troponin 15,417 on admission  - on IV heparin   - Echocardiogram with significant drop EF 25% global hypokinesis with severe inferior wall hypokinesis - Plan for right and left heart catheterization September  12 I have reviewed the risks, indications, and alternatives to cardiac catheterization, possible angioplasty, and stenting with the patient. Risks include but are not limited to bleeding, infection, vascular injury, stroke, myocardial infection, arrhythmia, kidney injury, radiation-related injury in the case of prolonged fluoroscopy use, emergency cardiac surgery, and death. The patient understands the risks of serious complication is 1-2 in 1000 with diagnostic cardiac cath and 1-2% or less with angioplasty/stenting.   Polysubstance abuse  - Positive for cocaine and marijuana - Cessation recommended   Hypertensive heart disease with CHF - Continue low-dose carvedilol , Entresto , spironolactone   - Continue  IV Lasix  twice daily - Could increase Entresto  dosing after catheterization if indicated Stressed to him the importance of aggressive blood pressure control   For questions or updates, please contact [facility] Please consult [email or web address] for contact info under    Signed, Velinda Lunger, MD, Ph.D [facility]

## 2023-10-05 NOTE — Progress Notes (Signed)
 PHARMACY - ANTICOAGULATION CONSULT NOTE  Pharmacy Consult for heparin  Indication: chest pain/ACS  Allergies  Allergen Reactions   Lisinopril  Other (See Comments)    Patient Measurements: Height: 5' 7 (170.2 cm) Weight: 119 kg (262 lb 6.4 oz) IBW/kg (Calculated) : 66.1 HEPARIN  DW (KG): 93.3  Vital Signs: Temp: 98.5 F (36.9 C) (09/11 0400) Temp Source: Oral (09/10 1958) BP: 145/109 (09/11 0400) Pulse Rate: 81 (09/11 0400)  Labs: Recent Labs    10/03/23 0659 10/03/23 0702 10/03/23 0702 10/03/23 0857 10/03/23 1402 10/03/23 1734 10/04/23 0301 10/04/23 0610 10/04/23 1147 10/04/23 2002 10/05/23 0412  HGB 15.7  --   --   --   --   --  16.7  --   --   --  15.9  HCT 46.5  --   --   --   --   --  48.6  --   --   --  47.0  PLT 156  --   --   --   --   --  171  --   --   --  163  HEPARINUNFRC  --   --   --   --   --    < > 0.10*  --  0.14* 0.11* 0.13*  CREATININE  --  0.87  --   --   --   --  0.73  --   --   --  0.85  CKTOTAL  --   --   --  301  --   --   --   --   --   --   --   TROPONINIHS  --  15,417*   < > 14,248* 14,004*  --   --  9,054*  --   --   --    < > = values in this interval not displayed.    Estimated Creatinine Clearance: 151.2 mL/min (by C-G formula based on SCr of 0.85 mg/dL).   Medical History: Past Medical History:  Diagnosis Date   CHF (congestive heart failure) (HCC)    Hypertension     Assessment: Pharmacy consulted to dose heparin  in patient with chest pain/ACS. PMH includes   polysubstance abuse, HFmrEF, hypertension, morbid obesity, and hyperparathyroidism. Patient is not on anticoagulation prior to admission and no compliance with any medications.   Pharmacy consulted to start and manage heparin  infusion for ACS.  CBC WNL Trop 15,417  Date Time Results Comments 9/9 1734 HL < 0.1 1250 units/hour 9/10     0301    HL < 0.1          1600 units/hour 9/10 1147 HL 0.14 SUBtherapeutic 9/10 2002 HL 0.11 SUBtherapeutic 9/11     0412    HL  0.13           SUBtherapeutic   Goal of Therapy:  Heparin  level 0.3-0.7 units/ml Monitor platelets by anticoagulation protocol: Yes   Plan:  9/11:  HL @ 0412 = 0.13, SUBtherapeutic - Will order heparin  2800 units IV X 1 bolus and increase drip rate to 2500 units/hr - recheck HL 6 hrs after rate change - CBC daily  Valery Chance D Clinical Pharmacist 10/05/2023 5:14 AM

## 2023-10-05 NOTE — Progress Notes (Signed)
 PHARMACY - ANTICOAGULATION CONSULT NOTE  Pharmacy Consult for heparin  Indication: chest pain/ACS  Allergies  Allergen Reactions   Lisinopril  Other (See Comments)    Patient Measurements: Height: 5' 7 (170.2 cm) Weight: 115.2 kg (254 lb) IBW/kg (Calculated) : 66.1 HEPARIN  DW (KG): 92.4  Vital Signs: Temp: 98.6 F (37 C) (09/11 1204) Temp Source: Oral (09/11 0757) BP: 126/98 (09/11 1204) Pulse Rate: 87 (09/11 1204)  Labs: Recent Labs    10/03/23 0659 10/03/23 0702 10/03/23 0702 10/03/23 0857 10/03/23 1402 10/03/23 1734 10/04/23 0301 10/04/23 0610 10/04/23 1147 10/04/23 2002 10/05/23 0412 10/05/23 1248  HGB 15.7  --   --   --   --   --  16.7  --   --   --  15.9  --   HCT 46.5  --   --   --   --   --  48.6  --   --   --  47.0  --   PLT 156  --   --   --   --   --  171  --   --   --  163  --   HEPARINUNFRC  --   --   --   --   --    < > 0.10*  --    < > 0.11* 0.13* 0.29*  CREATININE  --  0.87  --   --   --   --  0.73  --   --   --  0.85  --   CKTOTAL  --   --   --  301  --   --   --   --   --   --   --   --   TROPONINIHS  --  15,417*   < > 14,248* 14,004*  --   --  9,054*  --   --   --   --    < > = values in this interval not displayed.    Estimated Creatinine Clearance: 148.4 mL/min (by C-G formula based on SCr of 0.85 mg/dL).   Medical History: Past Medical History:  Diagnosis Date   CHF (congestive heart failure) (HCC)    Hypertension     Assessment: Pharmacy consulted to dose heparin  in patient with chest pain/ACS. PMH includes   polysubstance abuse, HFmrEF, hypertension, morbid obesity, and hyperparathyroidism. Patient is not on anticoagulation prior to admission and no compliance with any medications.   Pharmacy consulted to start and manage heparin  infusion for ACS.  CBC WNL Trop 15,417  Date Time Results Comments 9/9 1734 HL < 0.1 1250 units/hour 9/10     0301    HL < 0.1          1600 units/hour 9/10 1147 HL 0.14 SUBtherapeutic 9/10 2002 HL  0.11 SUBtherapeutic 9/11     0412    HL 0.13           SUBtherapeutic  9/11 1248 HL 0.29 SUBtherapeutic  Goal of Therapy:  Heparin  level 0.3-0.7 units/ml Monitor platelets by anticoagulation protocol: Yes   Plan:  9/11:  HL @ 0412 = 0.29, SUBtherapeutic - Will order heparin  1400 units IV X 1 bolus and increase drip rate to 2700 units/hr - recheck HL 6 hrs after rate change - CBC daily  Idolina DELENA Percy Clinical Pharmacist 10/05/2023 1:59 PM

## 2023-10-05 NOTE — Progress Notes (Signed)
 Rounding Note   Patient Name: Terrance Hawkins Date of Encounter: 10/05/2023  [facility] Cardiologist: Dr. Medford Hanson, [facility]  Subjective  No family at the bedside for discussion  Frustrated by wearing face mask for oxygen supplement States that he has sleep apnea, has not been wearing CPAP in the hospital.  Reports he is going through testing as outpatient for CPAP titration  Feels he only started to urinate yesterday after drinking more water  -3.5 L negative past 24 hours, -6 L total Still very short of breath lying supine, has to sit up  Echocardiogram results discussed with him in detail showing significant drop in ejection fraction 25 to 30%, global hypokinesis with severe inferior wall hypokinesis   Scheduled Meds:  aspirin  EC  81 mg Oral Daily   atorvastatin   40 mg Oral Daily   azithromycin   500 mg Oral Daily   carvedilol   3.125 mg Oral BID   empagliflozin   10 mg Oral Daily   fluticasone   2 spray Each Nare Daily   furosemide   40 mg Intravenous BID   guaiFENesin   600 mg Oral BID   loratadine   10 mg Oral Daily   sacubitril -valsartan   1 tablet Oral BID   sodium chloride  flush  3 mL Intravenous Q12H   spironolactone   25 mg Oral Daily   Continuous Infusions:  sodium chloride      cefTRIAXone  (ROCEPHIN )  IV 2 g (10/05/23 1008)   heparin  2,500 Units/hr (10/05/23 0544)   PRN Meds: acetaminophen , chlorpheniramine-HYDROcodone , cyclobenzaprine , LORazepam , ondansetron  (ZOFRAN ) IV, mouth rinse, sodium chloride  flush, traZODone    Vital Signs  Vitals:   10/05/23 0400 10/05/23 0500 10/05/23 0757 10/05/23 0900  BP: (!) 145/109  (!) 141/98   Pulse: 81  93   Resp: 19     Temp: 98.5 F (36.9 C)  (!) 97.3 F (36.3 C)   TempSrc:   Oral   SpO2: 100%  98%   Weight:  115.5 kg  115.2 kg  Height:    5' 7 (1.702 m)    Intake/Output Summary (Last 24 hours) at 10/05/2023 1136 Last data filed at 10/05/2023 1100 Gross per 24 hour  Intake 480 ml  Output 4100  ml  Net -3620 ml      10/05/2023    9:00 AM 10/05/2023    5:00 AM 10/04/2023    5:00 AM  Last 3 Weights  Weight (lbs) 254 lb 254 lb 10.1 oz 262 lb 6.4 oz  Weight (kg) 115.214 kg 115.5 kg 119.024 kg      Telemetry Normal sinus rhythm- Personally Reviewed  ECG   - Personally Reviewed  Physical Exam  Constitutional:  oriented to person, place, and time. No distress.  HENT:  Head: Normocephalic and atraumatic.  Eyes:  no discharge. No scleral icterus.  Neck: Normal range of motion. Neck supple.  JVD 10+  present.  Cardiovascular: Normal rate, regular rhythm, normal heart sounds and intact distal pulses. Exam reveals no gallop and no friction rub. No edema No murmur heard. Pulmonary/Chest: Scattered Rales no stridor. No respiratory distress.  no wheezes.    no tenderness.  Abdominal: Soft.  no distension.  no tenderness.  Musculoskeletal: Normal range of motion.  no  tenderness or deformity.  Neurological:  normal muscle tone. Coordination normal. No atrophy Skin: Skin is warm and dry. No rash noted. not diaphoretic.  Psychiatric:  normal mood and affect. behavior is normal. Thought content normal.   Labs High Sensitivity Troponin:   Recent Labs  Lab  10/03/23 0702 10/03/23 0857 10/03/23 1402 10/04/23 0610  TROPONINIHS 15,417* 14,248* 14,004* 9,054*     Chemistry Recent Labs  Lab 10/03/23 0702 10/03/23 0857 10/04/23 0301 10/05/23 0412  NA 137  --  138 137  K 3.3*  --  3.3* 3.9  CL 104  --  105 103  CO2 21*  --  23 23  GLUCOSE 104*  --  107* 103*  BUN 10  --  11 17  CREATININE 0.87  --  0.73 0.85  CALCIUM  8.7*  --  9.0 8.9  MG  --  1.8  --   --   PROT 6.5  --   --   --   ALBUMIN 3.6  --   --   --   AST 91*  --   --   --   ALT 45*  --   --   --   ALKPHOS 36*  --   --   --   BILITOT 0.9  --   --   --   GFRNONAA >60  --  >60 >60  ANIONGAP 12  --  10 11    Lipids No results for input(s): CHOL, TRIG, HDL, LABVLDL, LDLCALC, CHOLHDL in the last 168  hours.  Hematology Recent Labs  Lab 10/03/23 0659 10/04/23 0301 10/05/23 0412  WBC 12.0* 13.2* 13.3*  RBC 5.08 5.53 5.25  HGB 15.7 16.7 15.9  HCT 46.5 48.6 47.0  MCV 91.5 87.9 89.5  MCH 30.9 30.2 30.3  MCHC 33.8 34.4 33.8  RDW 13.4 13.2 13.0  PLT 156 171 163   Thyroid No results for input(s): TSH, FREET4 in the last 168 hours.  BNP Recent Labs  Lab 10/03/23 0702  BNP 384.0*    DDimer No results for input(s): DDIMER in the last 168 hours.   Radiology  ECHOCARDIOGRAM COMPLETE Result Date: 10/04/2023    ECHOCARDIOGRAM REPORT   Patient Name:   Terrance Hawkins Date of Exam: 10/03/2023 Medical Rec #:  [id number]          Height:       67.0 in Accession #:    [id number]         Weight:       260.6 lb Date of Birth:  [birth year]          BSA:          2.263 m Patient Age:    33 years           BP:           148/116 mmHg Patient Gender: M                  HR:           83 bpm. Exam Location:  [facility] Procedure: 2D Echo, Cardiac Doppler and Color Doppler (Both Spectral and Color            Flow Doppler were utilized during procedure). Indications:     I50.21 Acute Systolic CHF  History:         Patient has prior history of Echocardiogram examinations, most                  recent 09/07/2014. CHF; Risk Factors:Hypertension.  Sonographer:     Carl Coma RDCS Referring Phys:  [id number] CORT ONEIDA MANA Diagnosing Phys: Evalene Lunger MD IMPRESSIONS  1. Left ventricular ejection fraction, by estimation, is 25 to 30%. The left ventricle has severely decreased function. The left ventricle demonstrates  global hypokinesis with severe inferior wall hypokinesis. Anterior and anteroseptal wall motion best preserved. The left ventricular internal cavity size was moderately dilated. Left ventricular diastolic parameters are consistent with Grade II diastolic dysfunction (pseudonormalization).  2. Right ventricular systolic function is normal. The right ventricular size is normal. Tricuspid regurgitation  signal is inadequate for assessing PA pressure.  3. Left atrial size was moderately dilated.  4. The mitral valve is normal in structure. Moderate mitral valve regurgitation. No evidence of mitral stenosis.  5. The aortic valve is normal in structure. Aortic valve regurgitation is not visualized. No aortic stenosis is present.  6. The inferior vena cava is normal in size with <50% respiratory variability, suggesting right atrial pressure of 8 mmHg. FINDINGS  Left Ventricle: Left ventricular ejection fraction, by estimation, is 25 to 30%. The left ventricle has severely decreased function. The left ventricle demonstrates global hypokinesis. Strain was performed and the global longitudinal strain is indeterminate. The left ventricular internal cavity size was moderately dilated. There is no left ventricular hypertrophy. Left ventricular diastolic parameters are consistent with Grade II diastolic dysfunction (pseudonormalization). Right Ventricle: The right ventricular size is normal. No increase in right ventricular wall thickness. Right ventricular systolic function is normal. Tricuspid regurgitation signal is inadequate for assessing PA pressure. Left Atrium: Left atrial size was moderately dilated. Right Atrium: Right atrial size was normal in size. Pericardium: There is no evidence of pericardial effusion. Mitral Valve: The mitral valve is normal in structure. There is mild thickening of the mitral valve leaflet(s). There is mild calcification of the mitral valve leaflet(s). Moderate mitral valve regurgitation. No evidence of mitral valve stenosis. Tricuspid Valve: The tricuspid valve is normal in structure. Tricuspid valve regurgitation is mild . No evidence of tricuspid stenosis. Aortic Valve: The aortic valve is normal in structure. Aortic valve regurgitation is not visualized. No aortic stenosis is present. Pulmonic Valve: The pulmonic valve was normal in structure. Pulmonic valve regurgitation is not  visualized. No evidence of pulmonic stenosis. Aorta: The aortic root is normal in size and structure. Venous: The inferior vena cava is normal in size with less than 50% respiratory variability, suggesting right atrial pressure of 8 mmHg. IAS/Shunts: No atrial level shunt detected by color flow Doppler. Additional Comments: 3D was performed not requiring image post processing on an independent workstation and was indeterminate.  LEFT VENTRICLE PLAX 2D LVIDd:         6.70 cm   Diastology LVIDs:         5.90 cm   LV e' medial:    6.31 cm/s LV PW:         1.30 cm   LV E/e' medial:  14.8 LV IVS:        1.00 cm   LV e' lateral:   6.85 cm/s LVOT diam:     2.30 cm   LV E/e' lateral: 13.6 LV SV:         49 LV SV Index:   22 LVOT Area:     4.15 cm  RIGHT VENTRICLE             IVC RV Basal diam:  4.10 cm     IVC diam: 1.90 cm RV S prime:     21.60 cm/s TAPSE (M-mode): 3.4 cm LEFT ATRIUM              Index        RIGHT ATRIUM           Index LA  diam:        5.80 cm  2.56 cm/m   RA Area:     16.80 cm LA Vol (A2C):   104.0 ml 45.96 ml/m  RA Volume:   49.10 ml  21.70 ml/m LA Vol (A4C):   108.0 ml 47.72 ml/m LA Biplane Vol: 109.0 ml 48.17 ml/m  AORTIC VALVE LVOT Vmax:   69.50 cm/s LVOT Vmean:  61.000 cm/s LVOT VTI:    0.118 m  AORTA Ao Root diam: 3.70 cm Ao Asc diam:  3.70 cm MITRAL VALVE MV Area (PHT): 4.39 cm    SHUNTS MV Decel Time: 173 msec    Systemic VTI:  0.12 m MV E velocity: 93.30 cm/s  Systemic Diam: 2.30 cm MV A velocity: 68.10 cm/s MV E/A ratio:  1.37 Evalene Lunger MD Electronically signed by Evalene Lunger MD Signature Date/Time: 10/04/2023/7:52:37 AM    Final    DG Chest 1 View Result Date: 10/04/2023 EXAM: 1 VIEW XRAY OF THE CHEST 10/04/2023 06:31:33 AM COMPARISON: 10/03/2023 CLINICAL HISTORY: CHF (congestive heart failure) (HCC) 02706. Congestive heart failure, sob FINDINGS: LUNGS AND PLEURA: Bilateral interstitial and airspace opacities, right greater than left, progressed from prior. Possible left  pleural effusion. HEART AND MEDIASTINUM: Stable mild cardiomegaly. BONES AND SOFT TISSUES: No acute osseous abnormality. IMPRESSION: 1. Bilateral interstitial and airspace opacities, right greater than left, progressed from prior. In the setting of chf, imaging findings are favored to represent interstitial and alveolar edema. Underlying infection cannot be excluded. 2. Possible left pleural effusion. 3. Stable mild cardiomegaly. Electronically signed by: Waddell Calk MD 10/04/2023 06:39 AM EDT RP Workstation: [id number]    Patient Profile   Mr. Deklyn Cush is a 33 year old man with history of heart failure with mildly reduced ejection fraction based on echo in 2019 (etiology unclear), hypertension, hyperparathyroidism, ongoing polysubstance abuse, and morbid obesity, whom we have been asked to see due to shortness of breath and elevated troponin.   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  noncompliance with medications, polysubstance abuse, poorly controlled hypertension, no prior ischemic evaluation echo 08/2014 showed EF 40 to 45% with mild LVH -This admission with worsening shortness of breath, orthopnea - BNP 384, chest x-ray with pulmonary edema -Responding well to IV Lasix  twice daily -Cocaine positive, sometimes uses daily, often several times per week -Echocardiogram EF 25 to 30% with severe inferior wall hypokinesis,  global hypokinesis -Chest x-ray yesterday with worsening opacities on the right since admission, continued orthopnea, scant discolored sputum - We have recommended he continue IV Lasix  twice daily On low-dose carvedilol , Entresto  49/51 mg twice daily, spironolactone  25 daily Add Jardiance  10 mg daily  Elevated troponin - Troponin 15,417 on admission  - on IV heparin   - Echocardiogram with significant drop EF 25% global hypokinesis with severe inferior wall hypokinesis - Plan for right and left heart catheterization September  12 I have reviewed the risks, indications, and alternatives to cardiac catheterization, possible angioplasty, and stenting with the patient. Risks include but are not limited to bleeding, infection, vascular injury, stroke, myocardial infection, arrhythmia, kidney injury, radiation-related injury in the case of prolonged fluoroscopy use, emergency cardiac surgery, and death. The patient understands the risks of serious complication is 1-2 in 1000 with diagnostic cardiac cath and 1-2% or less with angioplasty/stenting.   Polysubstance abuse  - Positive for cocaine and marijuana - Cessation recommended   Hypertensive heart disease with CHF - Continue low-dose carvedilol , Entresto , spironolactone   - Continue  IV Lasix  twice daily - Could increase Entresto  dosing after catheterization if indicated Stressed to him the importance of aggressive blood pressure control   For questions or updates, please contact [facility] Please consult [email or web address] for contact info under    Signed, Velinda Lunger, MD, Ph.D [facility]

## 2023-10-05 NOTE — Progress Notes (Signed)
 Progress Note   Patient: Terrance Hawkins FMW:[id number] DOB: 1990/12/20 DOA: 10/03/2023     2 DOS: the patient was seen and examined on 10/05/2023   Brief hospital course: 33yo with h/o chronic HFrEF, refractory HTN, polysubstance abuse, and noncompliance with home medications who presented on 9/9 with SOB.  CXR with pulmonary congestion, troponin 15,000 with negative delta.  He was started on heparin  drip and IV Lasix  with cardiology consultation.  Echocardiogram ordered, likely need for cath later this week.  Echo with EF 25-30% with grade 2 DD and concern for inferior WMA (but also with global hypokinesis).  Assessment and Plan:  HTN emergency Medication noncompliance and polysubstance abuse Presented with markedly elevated and signs of endorgan damage of (elevated troponin and CHF) Improving after IV Lasix , continuing Resume small dose of Coreg  and losartan    Acute on chronic HFrEF decompensation Secondary to noncompliant with BP/CHF medications Echo with EF 25-30%, grade 2 DD, and ?inferior WMA IV diuresis Lasix  40 mg twice daily BP control is essential Cardiology is planning a cath, likely 9/12 due to scheduling Adding Jardiance   Fever Overnight fever to 101 with some overnight respiratory distress, hypoxemia Wearing venti mask O2 at 15L Has OSA, not wearing CPAP in the hospital Symptoms may be related to infection + CHF Will cover empirically for CAP with Ceftriaxone , Azithromycin  for now Check procalcitonin in AM   Hypokalemia Replete   Polysubstance abuse UDS + THC, cocaine Cessation encouraged   Medical noncompliance Ramifications of not taking chronic controlling medications reviewed and patient voices understanding with importance of taking medications, following up with appointments, etc.    Morbid/class 3 obesity Body mass index is 41.1 kg/m.SABRA  Weight loss should be encouraged Outpatient PCP/bariatric medicine f/u encouraged Significantly low or high BMI  is associated with higher medical risk including morbidity and mortality            Consultants: Cardiology   Procedures: Echocardiogram 9/9   Antibiotics: Ceftriaxone  9/11- Azithromycin  9/11-   30 Day Unplanned Readmission Risk Score    Flowsheet Row ED to Hosp-Admission (Current) from 10/03/2023 in [facility]  30 Day Unplanned Readmission Risk Score (%) 15.89 Filed at 10/05/2023 0801    This score is the patient's risk of an unplanned readmission within 30 days of being discharged (0 -100%). The score is based on dignosis, age, lab data, medications, orders, and past utilization.   Low:  0-14.9   Medium: 15-21.9   High: 22-29.9   Extreme: 30 and above           Subjective: No specific complaints.  Was on CPAP when I entered the room, but he said he hates it.  Resumed venti mask O2.   Objective: Vitals:   10/05/23 1204 10/05/23 1603  BP: (!) 126/98 (!) 139/105  Pulse: 87 93  Resp: 18 19  Temp: 98.6 F (37 C) 97.7 F (36.5 C)  SpO2: 98% 97%    Intake/Output Summary (Last 24 hours) at 10/05/2023 1632 Last data filed at 10/05/2023 1204 Gross per 24 hour  Intake 360 ml  Output 3950 ml  Net -3590 ml   Filed Weights   10/04/23 0500 10/05/23 0500 10/05/23 0900  Weight: 119 kg 115.5 kg 115.2 kg    Exam:  General:  Appears calm and comfortable and is in NAD Eyes:  normal lids, iris ENT:  grossly normal hearing, lips & tongue, mmm Cardiovascular:  RRR. No LE edema.  Respiratory:   CTA bilaterally with no  wheezes/rales/rhonchi.  Normal respiratory effort. Abdomen:  soft, NT, ND Skin:  no rash or induration seen on limited exam Musculoskeletal:  grossly normal tone BUE/BLE, good ROM, no bony abnormality Psychiatric:  grossly normal mood and affect, speech fluent and appropriate, AOx3 Neurologic:  CN 2-12 grossly intact, moves all extremities in coordinated fashion  Data Reviewed: I have reviewed the patient's lab results since admission.   Pertinent labs for today include:   Stable BMP WBC 13.3     Family Communication: None present      Code Status: Full Code   Disposition: Status is: Inpatient Remains inpatient appropriate because: plan for cath 9/12     Time spent: 50 minutes  Unresulted Labs (From admission, onward)     Start     Ordered   10/06/23 0500  Basic metabolic panel with GFR  Tomorrow morning,   R       Question:  Specimen collection method  Answer:  Lab=Lab collect   10/05/23 1632   10/05/23 2030  Heparin  level (unfractionated)  Once-Timed,   TIMED       Question:  Specimen collection method  Answer:  Lab=Lab collect   10/05/23 1444   10/04/23 0500  CBC  Daily,   STAT      10/03/23 9173             Author: Delon Herald, MD 10/05/2023 4:32 PM  For on call review [email or web address].

## 2023-10-05 NOTE — Progress Notes (Signed)
 PHARMACY - ANTICOAGULATION CONSULT NOTE  Pharmacy Consult for heparin  Indication: chest pain/ACS  Allergies  Allergen Reactions   Lisinopril  Other (See Comments)   Patient Measurements: Height: 5' 7 (170.2 cm) Weight: 115.2 kg (254 lb) IBW/kg (Calculated) : 66.1 HEPARIN  DW (KG): 92.4  Vital Signs: Temp: 98.8 F (37.1 C) (09/11 1953) BP: 149/108 (09/11 1953) Pulse Rate: 93 (09/11 1953)  Labs: Recent Labs    10/03/23 0659 10/03/23 0702 10/03/23 0702 10/03/23 0857 10/03/23 1402 10/03/23 1734 10/04/23 0301 10/04/23 0610 10/04/23 1147 10/05/23 0412 10/05/23 1248 10/05/23 2020  HGB 15.7  --   --   --   --   --  16.7  --   --  15.9  --   --   HCT 46.5  --   --   --   --   --  48.6  --   --  47.0  --   --   PLT 156  --   --   --   --   --  171  --   --  163  --   --   HEPARINUNFRC  --   --   --   --   --    < > 0.10*  --    < > 0.13* 0.29* 0.29*  CREATININE  --  0.87  --   --   --   --  0.73  --   --  0.85  --   --   CKTOTAL  --   --   --  301  --   --   --   --   --   --   --   --   TROPONINIHS  --  15,417*   < > 14,248* 14,004*  --   --  9,054*  --   --   --   --    < > = values in this interval not displayed.    Estimated Creatinine Clearance: 148.4 mL/min (by C-G formula based on SCr of 0.85 mg/dL).  Medical History: Past Medical History:  Diagnosis Date   CHF (congestive heart failure) (HCC)    Hypertension     Assessment: Pharmacy consulted to dose heparin  in patient with chest pain/ACS. PMH includes   polysubstance abuse, HFmrEF, hypertension, morbid obesity, and hyperparathyroidism. Patient is not on anticoagulation prior to admission and no compliance with any medications.   Pharmacy consulted to start and manage heparin  infusion for ACS.  CBC WNL Trop 15,417  Date Time Results Comments 9/9 1734 HL < 0.1 1250 units/hour 9/10     0301    HL < 0.1          1600 units/hour 9/10 1147 HL 0.14 SUBtherapeutic 9/10 2002 HL 0.11 SUBtherapeutic 9/11      0412    HL 0.13           SUBtherapeutic  9/11 1248 HL 0.29 SUBtherapeutic 9/11 2020 HL = 0.29 Sub-therapeutic, rate 2700u/hr  Goal of Therapy:  Heparin  level 0.3-0.7 units/ml Monitor platelets by anticoagulation protocol: Yes   Plan:  Give heparin  1400 units IV X 1 bolus Increase heparin  infusion rate to  rate to 2900 units/hr Check HL 6 hrs after rate change CBC daily  Kalecia Hartney Rodriguez-Guzman PharmD, BCPS 10/05/2023 9:27 PM

## 2023-10-06 ENCOUNTER — Encounter: Payer: Self-pay | Admitting: Cardiovascular Disease

## 2023-10-06 ENCOUNTER — Encounter
Admission: EM | Disposition: A | Discharge status: Home / Self Care | Payer: Self-pay | Source: Home / Self Care | Attending: Internal Medicine

## 2023-10-06 DIAGNOSIS — I509 Heart failure, unspecified: Secondary | ICD-10-CM | POA: Diagnosis not present

## 2023-10-06 DIAGNOSIS — R7989 Other specified abnormal findings of blood chemistry: Secondary | ICD-10-CM | POA: Diagnosis not present

## 2023-10-06 DIAGNOSIS — I2510 Atherosclerotic heart disease of native coronary artery without angina pectoris: Secondary | ICD-10-CM | POA: Diagnosis not present

## 2023-10-06 DIAGNOSIS — I214 Non-ST elevation (NSTEMI) myocardial infarction: Secondary | ICD-10-CM | POA: Diagnosis not present

## 2023-10-06 DIAGNOSIS — I5023 Acute on chronic systolic (congestive) heart failure: Secondary | ICD-10-CM | POA: Diagnosis not present

## 2023-10-06 HISTORY — PX: RIGHT/LEFT HEART CATH AND CORONARY ANGIOGRAPHY: CATH118266

## 2023-10-06 LAB — BASIC METABOLIC PANEL WITH GFR
Anion gap: 14 (ref 5–15)
BUN: 19 mg/dL (ref 6–20)
CO2: 23 mmol/L (ref 22–32)
Calcium: 8.9 mg/dL (ref 8.9–10.3)
Chloride: 101 mmol/L (ref 98–111)
Creatinine, Ser: 0.92 mg/dL (ref 0.61–1.24)
GFR, Estimated: >60 mL/min (ref >60)
Glucose, Bld: 99 mg/dL (ref 70–99)
Potassium: 3.4 mmol/L — ABNORMAL LOW (ref 3.5–5.1)
Sodium: 138 mmol/L (ref 135–145)

## 2023-10-06 LAB — POCT I-STAT EG7
Acid-Base Excess: 0 mmol/L (ref 0.0–2.0)
Bicarbonate: 24.1 mmol/L (ref 20.0–28.0)
Calcium, Ion: 1.18 mmol/L (ref 1.15–1.40)
HCT: 50 % (ref 39.0–52.0)
Hemoglobin: 17 g/dL (ref 13.0–17.0)
O2 Saturation: 61 %
Potassium: 3.5 mmol/L (ref 3.5–5.1)
Sodium: 138 mmol/L (ref 135–145)
TCO2: 25 mmol/L (ref 22–32)
pCO2, Ven: 37.9 mmHg — ABNORMAL LOW (ref 44–60)
pH, Ven: 7.412 (ref 7.25–7.43)
pO2, Ven: 31 mmHg — CL (ref 32–45)

## 2023-10-06 LAB — CBC
HCT: 48.7 % (ref 39.0–52.0)
Hemoglobin: 16.3 g/dL (ref 13.0–17.0)
MCH: 30.1 pg (ref 26.0–34.0)
MCHC: 33.5 g/dL (ref 30.0–36.0)
MCV: 89.9 fL (ref 80.0–100.0)
Platelets: 201 K/uL (ref 150–400)
RBC: 5.42 MIL/uL (ref 4.22–5.81)
RDW: 12.8 % (ref 11.5–15.5)
WBC: 9.8 K/uL (ref 4.0–10.5)
nRBC: 0 % (ref 0.0–0.2)

## 2023-10-06 LAB — HEPARIN LEVEL (UNFRACTIONATED): Heparin Unfractionated: 0.31 [IU]/mL (ref 0.30–0.70)

## 2023-10-06 SURGERY — RIGHT/LEFT HEART CATH AND CORONARY ANGIOGRAPHY
Anesthesia: Moderate Sedation

## 2023-10-06 MED ORDER — FENTANYL CITRATE (PF) 100 MCG/2ML IJ SOLN
INTRAMUSCULAR | Status: AC
  Filled 2023-10-06: qty 2

## 2023-10-06 MED ORDER — FUROSEMIDE 10 MG/ML IJ SOLN
INTRAMUSCULAR | Status: AC
  Filled 2023-10-06: qty 4

## 2023-10-06 MED ORDER — HEPARIN SODIUM (PORCINE) 1000 UNIT/ML IJ SOLN
INTRAMUSCULAR | Status: AC
  Filled 2023-10-06: qty 10

## 2023-10-06 MED ORDER — LIDOCAINE HCL 1 % IJ SOLN
INTRAMUSCULAR | Status: AC
  Filled 2023-10-06: qty 20

## 2023-10-06 MED ORDER — LIDOCAINE HCL (PF) 1 % IJ SOLN
INTRAMUSCULAR | Status: DC | PRN
  Administered 2023-10-06: 2 mL
  Administered 2023-10-06: 5 mL

## 2023-10-06 MED ORDER — FUROSEMIDE 40 MG PO TABS
40.0000 mg | ORAL_TABLET | Freq: Every day | ORAL | Status: DC
  Administered 2023-10-06 – 2023-10-07 (×2): 40 mg via ORAL
  Filled 2023-10-06: qty 1

## 2023-10-06 MED ORDER — ASPIRIN 81 MG PO CHEW
81.0000 mg | CHEWABLE_TABLET | Freq: Once | ORAL | Status: AC
  Administered 2023-10-06: 81 mg via ORAL
  Filled 2023-10-06: qty 1

## 2023-10-06 MED ORDER — SODIUM CHLORIDE 0.9% FLUSH: 3.0000 mL | INTRAVENOUS | Status: DC | PRN

## 2023-10-06 MED ORDER — HEPARIN (PORCINE) IN NACL 1000-0.9 UT/500ML-% IV SOLN
INTRAVENOUS | Status: DC | PRN
  Administered 2023-10-06 (×2): 500 mL

## 2023-10-06 MED ORDER — ENOXAPARIN SODIUM 40 MG/0.4ML IJ SOSY
40.0000 mg | PREFILLED_SYRINGE | INTRAMUSCULAR | Status: DC
Start: 2023-10-07 — End: 2023-10-07
  Filled 2023-10-06: qty 0.4

## 2023-10-06 MED ORDER — FENTANYL CITRATE (PF) 100 MCG/2ML IJ SOLN
INTRAMUSCULAR | Status: DC | PRN
  Administered 2023-10-06: 50 ug via INTRAVENOUS

## 2023-10-06 MED ORDER — SODIUM CHLORIDE 0.9 % IV SOLN: 250.0000 mL | INTRAVENOUS | Status: AC | PRN

## 2023-10-06 MED ORDER — HEPARIN SODIUM (PORCINE) 1000 UNIT/ML IJ SOLN
INTRAMUSCULAR | Status: DC | PRN
  Administered 2023-10-06: 5000 [IU] via INTRAVENOUS

## 2023-10-06 MED ORDER — IOHEXOL 300 MG/ML  SOLN
INTRAMUSCULAR | Status: DC | PRN
  Administered 2023-10-06: 44 mL

## 2023-10-06 MED ORDER — CLOPIDOGREL BISULFATE 75 MG PO TABS
300.0000 mg | ORAL_TABLET | Freq: Once | ORAL | Status: AC
  Administered 2023-10-06: 300 mg via ORAL

## 2023-10-06 MED ORDER — CLOPIDOGREL BISULFATE 75 MG PO TABS
ORAL_TABLET | ORAL | Status: AC
  Filled 2023-10-06: qty 4

## 2023-10-06 MED ORDER — VERAPAMIL HCL 2.5 MG/ML IV SOLN
INTRAVENOUS | Status: DC | PRN
  Administered 2023-10-06: 2.5 mg via INTRA_ARTERIAL

## 2023-10-06 MED ORDER — SODIUM CHLORIDE 0.9% FLUSH
3.0000 mL | Freq: Two times a day (BID) | INTRAVENOUS | Status: DC
  Administered 2023-10-06 – 2023-10-07 (×3): 3 mL via INTRAVENOUS

## 2023-10-06 MED ORDER — FREE WATER
250.0000 mL | Freq: Once | Status: AC
  Administered 2023-10-06: 250 mL via ORAL

## 2023-10-06 MED ORDER — MIDAZOLAM HCL 2 MG/2ML IJ SOLN
INTRAMUSCULAR | Status: AC
  Filled 2023-10-06: qty 2

## 2023-10-06 MED ORDER — FUROSEMIDE 10 MG/ML IJ SOLN
INTRAMUSCULAR | Status: AC
Start: 2023-10-06 — End: 2023-10-06
  Filled 2023-10-06: qty 4

## 2023-10-06 MED ORDER — CARVEDILOL 6.25 MG PO TABS
6.2500 mg | ORAL_TABLET | Freq: Two times a day (BID) | ORAL | Status: DC
  Administered 2023-10-06 – 2023-10-07 (×2): 6.25 mg via ORAL
  Filled 2023-10-06 (×2): qty 1

## 2023-10-06 MED ORDER — CARVEDILOL 6.25 MG PO TABS
6.2500 mg | ORAL_TABLET | Freq: Once | ORAL | Status: AC
  Administered 2023-10-06: 6.25 mg via ORAL
  Filled 2023-10-06: qty 1

## 2023-10-06 MED ORDER — MIDAZOLAM HCL 2 MG/2ML IJ SOLN
INTRAMUSCULAR | Status: DC | PRN
  Administered 2023-10-06: 1 mg via INTRAVENOUS

## 2023-10-06 MED ORDER — VERAPAMIL HCL 2.5 MG/ML IV SOLN
INTRAVENOUS | Status: AC
  Filled 2023-10-06: qty 2

## 2023-10-06 MED ORDER — CLOPIDOGREL BISULFATE 75 MG PO TABS
75.0000 mg | ORAL_TABLET | Freq: Every day | ORAL | Status: DC
  Administered 2023-10-07: 75 mg via ORAL
  Filled 2023-10-06: qty 1

## 2023-10-06 SURGICAL SUPPLY — 10 items
CATH BALLN WEDGE 5F 110CM (CATHETERS) IMPLANT
CATH INFINITI AMBI 5FR JK (CATHETERS) IMPLANT
DEVICE RAD TR BAND REGULAR (VASCULAR PRODUCTS) IMPLANT
DRAPE BRACHIAL (DRAPES) IMPLANT
GLIDESHEATH SLEND SS 6F .021 (SHEATH) IMPLANT
GUIDEWIRE INQWIRE 1.5J.035X260 (WIRE) IMPLANT
PACK CARDIAC CATH (CUSTOM PROCEDURE TRAY) ×1 IMPLANT
SET ATX-X65L (MISCELLANEOUS) IMPLANT
SHEATH GLIDE SLENDER 4/5FR (SHEATH) IMPLANT
STATION PROTECTION PRESSURIZED (MISCELLANEOUS) IMPLANT

## 2023-10-06 NOTE — Progress Notes (Signed)
 Heart Failure Stewardship Pharmacy Note  PCP: [facility] PCP-Cardiologist: None  HPI: Terrance Hawkins is a 33 y.o. male with history of HTN, CHF, cocaine use, alcohol ase who presented with 1 day history of shortness of breath. On admission, BNP was 384, HS-troponin was 84582 > 14248, AST 91, ALT 45, and UDS positive for cocaine. Chest x-ray noted bilateral opacities.  Patient stopped CHF medications for limited information on their indication.  Pertinent cardiac history: TTE in 08/2014 showed LVEF of 40-45%. Patient was briefly followed by cardiology, then was lost to follow up for several years. TTE 09/2023 showed LVEF reduced to 25-30%, G2DD, normal RV, moderate MR.  Pertinent Lab Values: Creatinine  Date Value Ref Range Status  04/13/2012 0.71 0.60 - 1.30 mg/dL Final   Creatinine, Ser  Date Value Ref Range Status  10/06/2023 0.92 0.61 - 1.24 mg/dL Final   BUN  Date Value Ref Range Status  10/06/2023 19 6 - 20 mg/dL Final  91/84/7980 9 6 - 20 mg/dL Final  96/78/7985 6 (L) 7 - 18 mg/dL Final   Potassium  Date Value Ref Range Status  10/06/2023 3.5 3.5 - 5.1 mmol/L Final  04/13/2012 3.5 3.5 - 5.1 mmol/L Final   Sodium  Date Value Ref Range Status  10/06/2023 138 135 - 145 mmol/L Final  09/07/2017 143 134 - 144 mmol/L Final  04/13/2012 138 136 - 145 mmol/L Final   B Natriuretic Peptide  Date Value Ref Range Status  10/03/2023 384.0 (H) 0.0 - 100.0 pg/mL Final    Comment:    Performed at [facility], [address] [place], [place] [address]   Magnesium   Date Value Ref Range Status  10/03/2023 1.8 1.7 - 2.4 mg/dL Final    Comment:    Performed at [facility], [address] [place], [place] [address]   Hgb A1c MFr Bld  Date Value Ref Range Status  03/19/2017 5.3 4.8 - 5.6 % Final    Comment:    (NOTE)         Prediabetes: 5.7 - 6.4         Diabetes: >6.4         Glycemic control for adults with diabetes:  <7.0    TSH  Date Value Ref Range Status  03/19/2017 2.520 0.350 - 4.500 uIU/mL Final    Comment:    Performed by a 3rd Generation assay with a functional sensitivity of <=0.01 uIU/mL. Performed at [facility], [address] [place], [place] [address]     Vital Signs: Temp:  [96.7 F (35.9 C)-98.9 F (37.2 C)] 98.5 F (36.9 C) (09/12 1225) Pulse Rate:  [77-93] 93 (09/12 1225) Cardiac Rhythm: Normal sinus rhythm (09/12 1200) Resp:  [14-29] 29 (09/12 1200) BP: (129-166)/(92-131) 133/110 (09/12 1225) SpO2:  [88 %-100 %] 100 % (09/12 1225) Weight:  [114 kg (251 lb 4.8 oz)] 114 kg (251 lb 4.8 oz) (09/12 9347)  Intake/Output Summary (Last 24 hours) at 10/06/2023 1354 Last data filed at 10/06/2023 1200 Gross per 24 hour  Intake 250 ml  Output 3025 ml  Net -2775 ml    Current Heart Failure Medications:  Loop diuretic: furosemide  40 mg IV BID Beta-Blocker: carvedilol  3.125 mg BID ACEI/ARB/ARNI: Entresto  49-51 mg BID MRA: spironolactone  25 mg daily SGLT2i: none Other: none  Prior to admission Heart Failure Medications:  None  Assessment: 1. 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 NYHA class III-IV symptoms.  -Symptoms: Reports shortness of breath is much improved, no longer on O2.  -Volume: Unable to see RHC numbers, but note states lower filling pressures. Urine color is clear today. Furosemide  transitioned to PO 40 mg daily. -Hemodynamics: BP is elevated. HR 70-90s.  -BB: Carvedilol  increased to 6.25 mg BID.   -ACEI/ARB/ARNI: Continue Entresto  49-51 mg BID. -MRA: Continue spironolactone  25 mg daily -SGLT2i: Jardiance  10 mg daily started  Plan: 1) Medication changes recommended at this time: -  None  2) Patient assistance: -Patient has medicaid, however, it is not paying at this time because there is an expired primary insurance on file. Will call insurance and attempt to resolve this  issue.  3) Education: - Patient has been educated on current HF medications and potential additions to HF medication regimen - Patient verbalizes understanding that over the next few months, these medication doses may change and more medications may be added to optimize HF regimen - Patient has been educated on basic disease state pathophysiology and goals of therapy  Medication Assistance / Insurance Benefits Check: Does the patient have prescription insurance?    Please do not hesitate to reach out with questions or concerns,  Jaun Bash, PharmD, CPP, BCPS, [facility] Heart Failure Pharmacist  Phone - [phone] 10/06/2023 1:54 PM

## 2023-10-06 NOTE — Progress Notes (Signed)
 Progress Note   Patient: Terrance Hawkins FMW:[id number] DOB: [birth year] DOA: 10/03/2023     3 DOS: the patient was seen and examined on 10/06/2023   Brief hospital course: 33yo with h/o chronic HFrEF, refractory HTN, polysubstance abuse, and noncompliance with home medications who presented on 9/9 with SOB.  CXR with pulmonary congestion, troponin 15,000 with negative delta.  He was started on heparin  drip and IV Lasix  with cardiology consultation.  Echocardiogram ordered, likely need for cath later this week.  Echo with EF 25-30% with grade 2 DD and concern for inferior WMA (but also with global hypokinesis).  Assessment and Plan:    HTN emergency Medication noncompliance and polysubstance abuse Presented with markedly elevated and signs of endorgan damage of (elevated troponin and CHF) Improving after IV Lasix , continuing Resume small dose of Coreg  and losartan    Acute on chronic HFrEF decompensation Secondary to noncompliant with BP/CHF medications Echo with EF 25-30%, grade 2 DD, and ?inferior WMA IV diuresis Lasix  40 mg twice daily BP control is essential Cardiology consulting Cath on 9/12 Adding Jardiance    Fever Overnight fever to 101 with some overnight respiratory distress, hypoxemia on 9/10-11; now resolved Wearing venti mask O2 at 15L -> room air as of 9/12 Has OSA, not wearing CPAP in the hospital Symptoms may be related to infection + CHF Will cover empirically for CAP with Ceftriaxone , Azithromycin  for now Check procalcitonin in AM Appears to be significantly better on 9/12   Hypokalemia Replete   Polysubstance abuse UDS + THC, cocaine Cessation encouraged   Medical noncompliance Ramifications of not taking chronic controlling medications reviewed and patient voices understanding with importance of taking medications, following up with appointments, etc.    Morbid/class 3 obesity Body mass index is 41.1 kg/m.SABRA  Weight loss should be  encouraged Outpatient PCP/bariatric medicine f/u encouraged Significantly low or high BMI is associated with higher medical risk including morbidity and mortality            Consultants: Cardiology   Procedures: Echocardiogram 9/9   Antibiotics: Ceftriaxone  9/11- Azithromycin  9/11-   30 Day Unplanned Readmission Risk Score    Flowsheet Row ED to Hosp-Admission (Current) from 10/03/20[facility] in [facility]  30 Day Unplanned Readmission Risk Score (%) 16.29 Filed at 10/06/2023 0801    This score is the patient's risk of an unplanned readmission within 30 days of being discharged (0 -100%). The score is based on dignosis, age, lab data, medications, orders, and past utilization.   Low:  0-14.9   Medium: 15-21.9   High: 22-29.9   Extreme: 30 and above           Subjective: Seen post-cath.  Understands the issues, including the importance of lifestyle modification and cocaine cessation.   Objective: Vitals:   10/06/23 1200 10/06/23 1225  BP: (!) 152/131 (!) 133/110  Pulse: 90 93  Resp: (!) 29 18  Temp: 97.7 F (36.5 C) 98.5 F (36.9 C)  SpO2: 95% 100%    Intake/Output Summary (Last 24 hours) at 10/06/2023 1448 Last data filed at 10/06/2023 1426 Gross per 24 hour  Intake 490 ml  Output 3025 ml  Net -2535 ml   Filed Weights   10/05/23 0500 10/05/23 0900 10/06/23 0652  Weight: 115.5 kg 115.2 kg 114 kg    Exam:   General:  Appears calm and comfortable and is in NAD, on RA Eyes:  normal lids, iris ENT:  grossly normal hearing, lips & tongue, mmm Cardiovascular:  RRR.  No LE edema.  Respiratory:   CTA bilaterally with no wheezes/rales/rhonchi.  Normal respiratory effort. Abdomen:  soft, NT, ND Skin:  no rash or induration seen on limited exam Musculoskeletal:  grossly normal tone BUE/BLE, good ROM, no bony abnormality Psychiatric:  grossly normal mood and affect, speech fluent and appropriate, AOx3 Neurologic:  CN 2-12 grossly intact, moves all  extremities in coordinated fashion   Data Reviewed: I have reviewed the patient's lab results since admission.  Pertinent labs for today include:  K+ 3.4 Normal CBC      Family Communication: None present; cardiology to speak with family post-procedure       Code Status: Full Code    Disposition: Status is: Inpatient Remains inpatient appropriate because: ongoing management     Time spent: 50 minutes  Unresulted Labs (From admission, onward)     Start     Ordered   10/07/23 0500  Lipoprotein A (LPA)  Tomorrow morning,   R       Question:  Specimen collection method  Answer:  Lab=Lab collect   10/06/23 1110   10/07/23 0500  Basic metabolic panel with GFR  Tomorrow morning,   R       Question:  Specimen collection method  Answer:  Lab=Lab collect   10/06/23 1447   10/07/23 0500  Procalcitonin  Tomorrow morning,   R       References:    Procalcitonin Lower Respiratory Tract Infection AND Sepsis Procalcitonin Algorithm  Question:  Specimen collection method  Answer:  Lab=Lab collect   10/06/23 1447   10/04/23 0500  CBC  Daily,   STAT      10/03/23 9173             Author: Delon Herald, MD 10/06/2023 2:48 PM  For on call review [email or web address].

## 2023-10-06 NOTE — Plan of Care (Signed)

## 2023-10-06 NOTE — Progress Notes (Signed)
 Returned to room for IV consult. On arrival, nurse reported consult no longer needed.

## 2023-10-06 NOTE — Progress Notes (Addendum)
 Rounding Note   Patient Name: Terrance Hawkins Date of Encounter: 10/06/2023  [facility] Cardiologist: Dr. Medford Hanson, [facility] Cardiology  Subjective  Cardiac catheterization showed occluded distal left circumflex which is a likely culprit for myocardial infarction.  The vessel is collateralized and no other obstructive disease. Right heart catheterization showed a wedge pressure of 15 with mildly reduced cardiac index.    Scheduled Meds:  aspirin  EC  81 mg Oral Daily   atorvastatin   40 mg Oral Daily   azithromycin   500 mg Oral Daily   carvedilol   6.25 mg Oral BID   carvedilol   6.25 mg Oral Once   [START ON 10/07/2023] clopidogrel   75 mg Oral Q breakfast   empagliflozin   10 mg Oral Daily   [START ON 10/07/2023] enoxaparin  (LOVENOX ) injection  40 mg Subcutaneous Q24H   fluticasone   2 spray Each Nare Daily   furosemide   40 mg Oral Daily   guaiFENesin   600 mg Oral BID   loratadine   10 mg Oral Daily   sacubitril -valsartan   1 tablet Oral BID   sodium chloride  flush  3 mL Intravenous Q12H   sodium chloride  flush  3 mL Intravenous Q12H   spironolactone   25 mg Oral Daily   Continuous Infusions:  sodium chloride      cefTRIAXone  (ROCEPHIN )  IV 2 g (10/06/23 0813)   PRN Meds: sodium chloride , acetaminophen , chlorpheniramine-HYDROcodone , cyclobenzaprine , LORazepam , ondansetron  (ZOFRAN ) IV, mouth rinse, sodium chloride  flush, sodium chloride  flush, traZODone    Vital Signs  Vitals:   10/06/23 1130 10/06/23 1145 10/06/23 1200 10/06/23 1225  BP: (!) 142/121 (!) 149/121 (!) 152/131 (!) 133/110  Pulse: 81 88 90 93  Resp: 14 20 (!) 29   Temp:   97.7 F (36.5 C) 98.5 F (36.9 C)  TempSrc:   Temporal Oral  SpO2: 97% 95% 95% 100%  Weight:      Height:        Intake/Output Summary (Last 24 hours) at 10/06/2023 1248 Last data filed at 10/06/2023 1200 Gross per 24 hour  Intake 250 ml  Output 3025 ml  Net -2775 ml      10/06/2023    6:52 AM 10/05/2023    9:00 AM 10/05/2023     5:00 AM  Last 3 Weights  Weight (lbs) 251 lb 4.8 oz 254 lb 254 lb 10.1 oz  Weight (kg) 113.989 kg 115.214 kg 115.5 kg      Telemetry Normal sinus rhythm- Personally Reviewed  ECG   - Personally Reviewed  Physical Exam  Constitutional:  oriented to person, place, and time. No distress.  HENT:  Head: Normocephalic and atraumatic.  Eyes:  no discharge. No scleral icterus.  Neck: Normal range of motion. Neck supple.  JVD 10+  present.  Cardiovascular: Normal rate, regular rhythm, normal heart sounds and intact distal pulses. Exam reveals no gallop and no friction rub. No edema No murmur heard. Pulmonary/Chest: Scattered Rales no stridor. No respiratory distress.  no wheezes.    no tenderness.  Abdominal: Soft.  no distension.  no tenderness.  Musculoskeletal: Normal range of motion.  no  tenderness or deformity.  Neurological:  normal muscle tone. Coordination normal. No atrophy Skin: Skin is warm and dry. No rash noted. not diaphoretic.  Psychiatric:  normal mood and affect. behavior is normal. Thought content normal.   Labs High Sensitivity Troponin:   Recent Labs  Lab 10/03/23 0702 10/03/23 0857 10/03/23 1402 10/04/23 0610  TROPONINIHS 15,417* 14,248* 14,004* 9,054*     Chemistry Recent  Labs  Lab 10/03/23 0702 10/03/23 0857 10/04/23 0301 10/05/23 0412 10/06/23 0403 10/06/23 0959  NA 137  --  138 137 138 138  K 3.3*  --  3.3* 3.9 3.4* 3.5  CL 104  --  105 103 101  --   CO2 21*  --  23 23 23   --   GLUCOSE 104*  --  107* 103* 99  --   BUN 10  --  11 17 19   --   CREATININE 0.87  --  0.73 0.85 0.92  --   CALCIUM  8.7*  --  9.0 8.9 8.9  --   MG  --  1.8  --   --   --   --   PROT 6.5  --   --   --   --   --   ALBUMIN 3.6  --   --   --   --   --   AST 91*  --   --   --   --   --   ALT 45*  --   --   --   --   --   ALKPHOS 36*  --   --   --   --   --   BILITOT 0.9  --   --   --   --   --   GFRNONAA >60  --  >60 >60 >60  --   ANIONGAP 12  --  10 11 14   --      Lipids No results for input(s): CHOL, TRIG, HDL, LABVLDL, LDLCALC, CHOLHDL in the last 168 hours.  Hematology Recent Labs  Lab 10/04/23 0301 10/05/23 0412 10/06/23 0403 10/06/23 0959  WBC 13.2* 13.3* 9.8  --   RBC 5.53 5.25 5.42  --   HGB 16.7 15.9 16.3 17.0  HCT 48.6 47.0 48.7 50.0  MCV 87.9 89.5 89.9  --   MCH 30.2 30.3 30.1  --   MCHC 34.4 33.8 33.5  --   RDW 13.2 13.0 12.8  --   PLT 171 163 201  --    Thyroid No results for input(s): TSH, FREET4 in the last 168 hours.  BNP Recent Labs  Lab 10/03/23 0702  BNP 384.0*    DDimer No results for input(s): DDIMER in the last 168 hours.   Radiology  CARDIAC CATHETERIZATION Addendum Date: 10/06/2023   Dist Cx lesion is 100% stenosed.   Mid LAD lesion is 40% stenosed. 1.  Codominant coronary arteries with occluded distal left circumflex which is a likely culprit for recent non-STEMI.  Collaterals are noted to the PL branches. 2.  Left ventricular angiography was not performed.  EF was severely reduced by echo. 3.  Right heart catheterization showed normal right atrial pressure, mildly elevated wedge pressure, minimal pulmonary hypertension and mildly reduced cardiac output. Recommendations: The left circumflex has been occluded for more than 48 hours with collaterals and no angina at this time.  Recommend medical therapy.  I added clopidogrel  to be used for 1 year. I switched IV furosemide  to oral. Recommend medical therapy for coronary artery disease and heart failure.  Result Date: 10/06/2023   Dist Cx lesion is 100% stenosed. 1.  Codominant coronary arteries with occluded distal left circumflex which is a likely culprit for recent non-STEMI.  Collaterals are noted to the PL branches. 2.  Left ventricular angiography was not performed.  EF was severely reduced by echo. 3.  Right heart catheterization showed normal right atrial pressure, mildly  elevated wedge pressure, minimal pulmonary hypertension and mildly reduced  cardiac output. Recommendations: The left circumflex has been occluded for more than 48 hours with collaterals and no angina at this time.  Recommend medical therapy.  I added clopidogrel  to be used for 1 year. I switched IV furosemide  to oral. Recommend medical therapy for coronary artery disease and heart failure.    Patient Profile   Mr. Terrance Hawkins is a 33 year old man with history of heart failure with mildly reduced ejection fraction based on echo in 2019 (etiology unclear), hypertension, hyperparathyroidism, ongoing polysubstance abuse, and morbid obesity, whom we have been asked to see due to shortness of breath and elevated troponin.   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  noncompliance with medications, polysubstance abuse, poorly controlled hypertension, no prior ischemic evaluation echo 08/2014 showed EF 40 to 45% with mild LVH -This admission with worsening shortness of breath, orthopnea - BNP 384, chest x-ray with pulmonary edema -Cocaine positive, sometimes uses daily, often several times per week -Echocardiogram EF 25 to 30% with severe inferior wall hypokinesis,  global hypokinesis  On low-dose carvedilol , Entresto  49/51 mg twice daily, spironolactone  25 daily Add Jardiance  10 mg daily. I increased the dose of carvedilol  to 6.25 mg twice daily.  His blood pressure is still not optimally controlled and we might have to go up on Entresto  tomorrow.  I switch furosemide  to 40 mg by mouth once daily.  Elevated troponin - Troponin 15,417 on admission  - Cardiac catheterization showed occluded distal left circumflex which is a likely culprit for recent myocardial infarction.  Collaterals noted to the PL branches.  Given occlusion, presence of collaterals and no angina, I recommended medical therapy. I added clopidogrel  to be used for at least 1 year.  Polysubstance abuse  - Positive for cocaine and marijuana - I strongly advised him  to stop cocaine use which is detrimental to his cardiac health.   Hypertensive heart disease with CHF - Continue e carvedilol , Entresto , spironolactone   - Continue oral furosemide   Suspected sleep apnea: The patient was noted to have apnea episodes during cardiac catheterization.  This is likely contributing to his heart failure and uncontrolled hypertension.  Recommend evaluation and treatment with CPAP.    For questions or updates, please contact [facility] Please consult [email or web address] for contact info under    Signed, Deatrice Cage, MD, [facility]

## 2023-10-06 NOTE — Progress Notes (Signed)
 PHARMACY - ANTICOAGULATION CONSULT NOTE  Pharmacy Consult for heparin  Indication: chest pain/ACS  Allergies  Allergen Reactions   Lisinopril  Other (See Comments)   Patient Measurements: Height: 5' 7 (170.2 cm) Weight: 115.2 kg (254 lb) IBW/kg (Calculated) : 66.1 HEPARIN  DW (KG): 92.4  Vital Signs: Temp: 98 F (36.7 C) (09/12 0321) BP: 154/92 (09/12 0321) Pulse Rate: 82 (09/12 0321)  Labs: Recent Labs    10/03/23 0857 10/03/23 1402 10/03/23 1734 10/04/23 0301 10/04/23 0610 10/04/23 1147 10/05/23 0412 10/05/23 1248 10/05/23 2020 10/06/23 0403  HGB  --   --   --  16.7  --   --  15.9  --   --  16.3  HCT  --   --   --  48.6  --   --  47.0  --   --  48.7  PLT  --   --   --  171  --   --  163  --   --  201  HEPARINUNFRC  --   --    < > 0.10*  --    < > 0.13* 0.29* 0.29* 0.31  CREATININE  --   --   --  0.73  --   --  0.85  --   --  0.92  CKTOTAL 301  --   --   --   --   --   --   --   --   --   TROPONINIHS 14,248* 14,004*  --   --  9,054*  --   --   --   --   --    < > = values in this interval not displayed.    Estimated Creatinine Clearance: 137.1 mL/min (by C-G formula based on SCr of 0.92 mg/dL).  Medical History: Past Medical History:  Diagnosis Date   CHF (congestive heart failure) (HCC)    Hypertension     Assessment: Pharmacy consulted to dose heparin  in patient with chest pain/ACS. PMH includes   polysubstance abuse, HFmrEF, hypertension, morbid obesity, and hyperparathyroidism. Patient is not on anticoagulation prior to admission and no compliance with any medications.   Pharmacy consulted to start and manage heparin  infusion for ACS.  CBC WNL Trop 15,417  Date Time Results Comments 9/9 1734 HL < 0.1 1250 units/hour 9/10     0301    HL < 0.1          1600 units/hour 9/10 1147 HL 0.14 SUBtherapeutic 9/10 2002 HL 0.11 SUBtherapeutic 9/11     0412    HL 0.13           SUBtherapeutic  9/11 1248 HL 0.29 SUBtherapeutic 9/11 2020 HL =  0.29 Sub-therapeutic, rate 2700u/hr 9/12     0403    HL 0.31           Therapeutic X 1   Goal of Therapy:  Heparin  level 0.3-0.7 units/ml Monitor platelets by anticoagulation protocol: Yes   Plan:  9/12:  HL @ 0403 = 0.31, therapeutic X 1 - will continue pt on current rate and recheck HL in 6 hrs CBC daily  Adaisha Campise D 10/06/2023 6:00 AM

## 2023-10-06 NOTE — Interval H&P Note (Signed)
 History and Physical Interval Note:  10/06/2023 9:39 AM  Terrance Hawkins  has presented today for surgery, with the diagnosis of heart failure with reduced ejection fraction.  The various methods of treatment have been discussed with the patient and family. After consideration of risks, benefits and other options for treatment, the patient has consented to  Procedure(s): RIGHT/LEFT HEART CATH AND CORONARY ANGIOGRAPHY (N/A) as a surgical intervention.  The patient's history has been reviewed, patient examined, no change in status, stable for surgery.  I have reviewed the patient's chart and labs.  Questions were answered to the patient's satisfaction.     [name]

## 2023-10-06 NOTE — Progress Notes (Signed)
 Responded to consult for IV. Pt in restroom. Will return at later time.

## 2023-10-07 ENCOUNTER — Other Ambulatory Visit: Payer: Self-pay

## 2023-10-07 DIAGNOSIS — I10 Essential (primary) hypertension: Secondary | ICD-10-CM | POA: Diagnosis not present

## 2023-10-07 DIAGNOSIS — I255 Ischemic cardiomyopathy: Secondary | ICD-10-CM | POA: Diagnosis not present

## 2023-10-07 DIAGNOSIS — E785 Hyperlipidemia, unspecified: Secondary | ICD-10-CM

## 2023-10-07 DIAGNOSIS — I5041 Acute combined systolic (congestive) and diastolic (congestive) heart failure: Secondary | ICD-10-CM

## 2023-10-07 DIAGNOSIS — I340 Nonrheumatic mitral (valve) insufficiency: Secondary | ICD-10-CM

## 2023-10-07 DIAGNOSIS — G4733 Obstructive sleep apnea (adult) (pediatric): Secondary | ICD-10-CM

## 2023-10-07 DIAGNOSIS — I214 Non-ST elevation (NSTEMI) myocardial infarction: Secondary | ICD-10-CM | POA: Diagnosis not present

## 2023-10-07 LAB — CBC
HCT: 50.9 % (ref 39.0–52.0)
Hemoglobin: 17.2 g/dL — ABNORMAL HIGH (ref 13.0–17.0)
MCH: 29.6 pg (ref 26.0–34.0)
MCHC: 33.8 g/dL (ref 30.0–36.0)
MCV: 87.6 fL (ref 80.0–100.0)
Platelets: 247 K/uL (ref 150–400)
RBC: 5.81 MIL/uL (ref 4.22–5.81)
RDW: 12.7 % (ref 11.5–15.5)
WBC: 9.6 K/uL (ref 4.0–10.5)
nRBC: 0 % (ref 0.0–0.2)

## 2023-10-07 LAB — BASIC METABOLIC PANEL WITH GFR
Anion gap: 11 (ref 5–15)
BUN: 21 mg/dL — ABNORMAL HIGH (ref 6–20)
CO2: 21 mmol/L — ABNORMAL LOW (ref 22–32)
Calcium: 9 mg/dL (ref 8.9–10.3)
Chloride: 101 mmol/L (ref 98–111)
Creatinine, Ser: 1.04 mg/dL (ref 0.61–1.24)
GFR, Estimated: >60 mL/min (ref >60)
Glucose, Bld: 103 mg/dL — ABNORMAL HIGH (ref 70–99)
Potassium: 3.5 mmol/L (ref 3.5–5.1)
Sodium: 133 mmol/L — ABNORMAL LOW (ref 135–145)

## 2023-10-07 LAB — LIPID PANEL
Cholesterol: 155 mg/dL (ref 0–200)
HDL: 35 mg/dL — ABNORMAL LOW (ref >40)
LDL Cholesterol: 97 mg/dL (ref 0–99)
Total CHOL/HDL Ratio: 4.4 ratio
Triglycerides: 116 mg/dL (ref <150)
VLDL: 23 mg/dL (ref 0–40)

## 2023-10-07 LAB — PROCALCITONIN: Procalcitonin: 0.16 ng/mL

## 2023-10-07 MED ORDER — CLOPIDOGREL BISULFATE 75 MG PO TABS
75.0000 mg | ORAL_TABLET | Freq: Every day | ORAL | 0 refills | Status: DC
  Filled 2023-10-07: qty 30, 30d supply, fill #0

## 2023-10-07 MED ORDER — SPIRONOLACTONE 25 MG PO TABS
25.0000 mg | ORAL_TABLET | Freq: Every day | ORAL | 0 refills | Status: DC
  Filled 2023-10-07: qty 30, 30d supply, fill #0

## 2023-10-07 MED ORDER — FUROSEMIDE 40 MG PO TABS
40.0000 mg | ORAL_TABLET | Freq: Every day | ORAL | 0 refills | Status: DC
  Filled 2023-10-07: qty 30, 30d supply, fill #0

## 2023-10-07 MED ORDER — ASPIRIN 81 MG PO TBEC
81.0000 mg | DELAYED_RELEASE_TABLET | Freq: Every day | ORAL | 12 refills | Status: AC
  Filled 2023-10-07: qty 30, 30d supply, fill #0

## 2023-10-07 MED ORDER — EMPAGLIFLOZIN 10 MG PO TABS
10.0000 mg | ORAL_TABLET | Freq: Every day | ORAL | 0 refills | Status: DC
  Filled 2023-10-07: qty 30, 30d supply, fill #0

## 2023-10-07 MED ORDER — CARVEDILOL 6.25 MG PO TABS
6.2500 mg | ORAL_TABLET | Freq: Two times a day (BID) | ORAL | 0 refills | Status: DC
  Filled 2023-10-07: qty 60, 30d supply, fill #0

## 2023-10-07 MED ORDER — SACUBITRIL-VALSARTAN 49-51 MG PO TABS
1.0000 | ORAL_TABLET | Freq: Two times a day (BID) | ORAL | 0 refills | Status: DC
  Filled 2023-10-07: qty 60, 30d supply, fill #0

## 2023-10-07 MED ORDER — DOXYCYCLINE HYCLATE 100 MG PO TABS
100.0000 mg | ORAL_TABLET | Freq: Two times a day (BID) | ORAL | 0 refills | Status: AC
Start: 2023-10-07 — End: 2023-10-12
  Filled 2023-10-07: qty 10, 5d supply, fill #0

## 2023-10-07 MED ORDER — ATORVASTATIN CALCIUM 40 MG PO TABS
40.0000 mg | ORAL_TABLET | Freq: Every day | ORAL | 0 refills | Status: DC
  Filled 2023-10-07: qty 30, 30d supply, fill #0

## 2023-10-07 NOTE — Discharge Summary (Signed)
 Physician Discharge Summary   Patient: Terrance Hawkins MRN: [id number] DOB: May 31, 1990  Admit date:     10/03/2023  Discharge date: 10/07/23  Discharge Physician: Delon Herald   PCP: [facility]   Recommendations at discharge:   Do NOT use cocaine!  Also avoid tobacco and marijuana. Take all medications as prescribed and do not miss doses You are being referred to cardiac rehabilitation Follow up at heart failure clinic on 9/17 Work with PCP regarding CPAP Complete antibiotics as prescribed (Doxycycline  twice a day for 5 more days) Follow up with PCP in 1-2 weeks, call for an appointment  Discharge Diagnoses: Principal Problem:   NSTEMI (non-ST elevated myocardial infarction) [facility]) Active Problems:   Acute pulmonary edema (HCC)   Acute on chronic systolic heart failure (HCC)   Respiratory distress   SOB (shortness of breath)   Polysubstance abuse (HCC)   Hypertensive heart disease with heart failure [facility])    Hospital Course: 33yo with h/o chronic HFrEF, refractory HTN, polysubstance abuse, and noncompliance with home medications who presented on 9/9 with SOB.  CXR with pulmonary congestion, troponin 15,000 with negative delta.  He was started on heparin  drip and IV Lasix  with cardiology consultation.  Echocardiogram ordered, likely need for cath later this week.  Echo with EF 25-30% with grade 2 DD and concern for inferior WMA (but also with global hypokinesis).  Assessment and Plan:  HTN emergency Medication noncompliance and polysubstance abuse Presented with markedly elevated and signs of endorgan damage of (elevated troponin and CHF) Improved after IV Lasix  and BP medications Resume small dose of Coreg  and losartan    Acute on chronic HFrEF decompensation Secondary to noncompliant with BP/CHF medications Echo with EF 25-30%, grade 2 DD, and ?inferior WMA IV diuresis Lasix  40 mg twice daily BP control is essential Cardiology  consulting Cath on 9/12 Adding Jardiance    Fever Overnight fever to 101 with some overnight respiratory distress, hypoxemia on 9/10-11; now resolved Wearing venti mask O2 at 15L -> room air as of 9/12 Has OSA, not wearing CPAP in the hospital Symptoms may be related to infection + CHF Will cover empirically for CAP with Ceftriaxone , Azithromycin  for now Appears to be significantly better, will continue doxycyline to complete 7 days   Hypokalemia Repleted   Polysubstance abuse UDS + THC, cocaine Cessation encouraged   Medical noncompliance Ramifications of not taking chronic controlling medications reviewed and patient voices understanding with importance of taking medications, following up with appointments, etc.    Morbid/class 3 obesity Body mass index is 41.1 kg/m.SABRA  Weight loss should be encouraged Outpatient PCP/bariatric medicine f/u encouraged Significantly low or high BMI is associated with higher medical risk including morbidity and mortality            Consultants: Cardiology   Procedures: Echocardiogram 9/9   Antibiotics: Ceftriaxone  9/11-13 Azithromycin  9/11-13 Doxycyline for 5 more days    Pain control - [place]  Controlled Substance Reporting System database was reviewed. and patient was instructed, not to drive, operate heavy machinery, perform activities at heights, swimming or participation in water  activities or provide baby-sitting services while on Pain, Sleep and Anxiety Medications; until their outpatient Physician has advised to do so again. Also recommended to not to take more than prescribed Pain, Sleep and Anxiety Medications.   Disposition: Home Diet recommendation:  Cardiac diet DISCHARGE MEDICATION: Allergies as of 10/07/2023       Reactions   Lisinopril  Other (See Comments)  Medication List     STOP taking these medications    amLODipine  10 MG tablet Commonly known as: NORVASC    cetirizine 10 MG  tablet Commonly known as: ZYRTEC   cloNIDine  0.2 MG tablet Commonly known as: CATAPRES    diltiazem 300 MG 24 hr capsule Commonly known as: CARDIZEM CD   Flonase  Allergy Relief 50 MCG/ACT nasal spray Generic drug: fluticasone    HYDROcodone -acetaminophen  5-325 MG tablet Commonly known as: Norco   losartan  100 MG tablet Commonly known as: COZAAR    Meloxicam  15 MG Tbdp   omeprazole 20 MG capsule Commonly known as: PRILOSEC   potassium chloride  SA 20 MEQ tablet Commonly known as: KLOR-CON  M   venlafaxine XR 75 MG 24 hr capsule Commonly known as: EFFEXOR-XR   Vitamin D  (Ergocalciferol ) 1.25 MG (50000 UNIT) Caps capsule Commonly known as: DRISDOL       TAKE these medications    aspirin  EC 81 MG tablet Take 1 tablet (81 mg total) by mouth daily. Swallow whole. Start taking on: October 08, 2023   atorvastatin  40 MG tablet Commonly known as: LIPITOR Take 1 tablet (40 mg total) by mouth daily. Start taking on: October 08, 2023 What changed:  medication strength how much to take   carvedilol  6.25 MG tablet Commonly known as: COREG  Take 1 tablet (6.25 mg total) by mouth 2 (two) times daily. What changed:  medication strength how much to take   clopidogrel  75 MG tablet Commonly known as: PLAVIX  Take 1 tablet (75 mg total) by mouth daily with breakfast. Start taking on: October 08, 2023   doxycycline  100 MG tablet Commonly known as: VIBRA -TABS Take 1 tablet (100 mg total) by mouth 2 (two) times daily for 5 days.   empagliflozin  10 MG Tabs tablet Commonly known as: JARDIANCE  Take 1 tablet (10 mg total) by mouth daily. Start taking on: October 08, 2023   furosemide  40 MG tablet Commonly known as: LASIX  Take 1 tablet (40 mg total) by mouth daily.   sacubitril -valsartan  49-51 MG Commonly known as: ENTRESTO  Take 1 tablet by mouth 2 (two) times daily.   spironolactone  25 MG tablet Commonly known as: ALDACTONE  Take 1 tablet (25 mg total) by mouth  daily.        Discharge Exam:   Subjective: Feeling well.  Dressed at the bedside with feet propped on the bed, reports that he wants to be called [employer] and is more than ready to go home.   Objective: Vitals:   10/07/23 0808 10/07/23 1030  BP: (!) 143/117 (!) 142/110  Pulse: 93 92  Resp: 18   Temp: 98.2 F (36.8 C)   SpO2: 99%     Intake/Output Summary (Last 24 hours) at 10/07/2023 1047 Last data filed at 10/07/2023 0500 Gross per 24 hour  Intake 610 ml  Output 2125 ml  Net -1515 ml   Filed Weights   10/05/23 0900 10/06/23 0652 10/07/23 0500  Weight: 115.2 kg 114 kg 113.5 kg    Exam:   General:  Appears calm and comfortable and is in NAD, on RA Eyes:  normal lids, iris ENT:  grossly normal hearing, lips & tongue, mmm Cardiovascular:  RRR. No LE edema.  Respiratory:   CTA bilaterally with no wheezes/rales/rhonchi.  Normal respiratory effort. Abdomen:  soft, NT, ND Skin:  no rash or induration seen on limited exam Musculoskeletal:  grossly normal tone BUE/BLE, good ROM, no bony abnormality Psychiatric:  grossly normal mood and affect, speech fluent and appropriate, AOx3 Neurologic:  CN  2-12 grossly intact, moves all extremities in coordinated fashion  Data Reviewed: I have reviewed the patient's lab results since admission.  Pertinent labs for today include:   Unremarkable BMP Procalcitonin 0.16 WBC 9.6    Condition at discharge: improving  The results of significant diagnostics from this hospitalization (including imaging, microbiology, ancillary and laboratory) are listed below for reference.   Imaging Studies: CARDIAC CATHETERIZATION Addendum Date: 10/06/2023   Dist Cx lesion is 100% stenosed.   Mid LAD lesion is 40% stenosed. 1.  Codominant coronary arteries with occluded distal left circumflex which is a likely culprit for recent non-STEMI.  Collaterals are noted to the PL branches. 2.  Left ventricular angiography was not performed.  EF was  severely reduced by echo. 3.  Right heart catheterization showed normal right atrial pressure, mildly elevated wedge pressure, minimal pulmonary hypertension and mildly reduced cardiac output. Recommendations: The left circumflex has been occluded for more than 48 hours with collaterals and no angina at this time.  Recommend medical therapy.  I added clopidogrel  to be used for 1 year. I switched IV furosemide  to oral. Recommend medical therapy for coronary artery disease and heart failure.  Result Date: 10/06/2023   Dist Cx lesion is 100% stenosed. 1.  Codominant coronary arteries with occluded distal left circumflex which is a likely culprit for recent non-STEMI.  Collaterals are noted to the PL branches. 2.  Left ventricular angiography was not performed.  EF was severely reduced by echo. 3.  Right heart catheterization showed normal right atrial pressure, mildly elevated wedge pressure, minimal pulmonary hypertension and mildly reduced cardiac output. Recommendations: The left circumflex has been occluded for more than 48 hours with collaterals and no angina at this time.  Recommend medical therapy.  I added clopidogrel  to be used for 1 year. I switched IV furosemide  to oral. Recommend medical therapy for coronary artery disease and heart failure.   ECHOCARDIOGRAM COMPLETE Result Date: 10/04/2023    ECHOCARDIOGRAM REPORT   Patient Name:   TONYA CARLILE Date of Exam: 10/03/2023 Medical Rec #:  [id number]          Height:       67.0 in Accession #:    [id number]         Weight:       260.6 lb Date of Birth:  07-25-1990          BSA:          2.263 m Patient Age:    33 years           BP:           148/116 mmHg Patient Gender: M                  HR:           83 bpm. Exam Location:  [facility] Procedure: 2D Echo, Cardiac Doppler and Color Doppler (Both Spectral and Color            Flow Doppler were utilized during procedure). Indications:     I50.21 Acute Systolic CHF  History:         Patient has prior history of  Echocardiogram examinations, most                  recent 09/07/2014. CHF; Risk Factors:Hypertension.  Sonographer:     Carl Coma RDCS Referring Phys:  [id number] CORT ONEIDA MANA Diagnosing Phys: Evalene Lunger MD IMPRESSIONS  1. Left ventricular ejection fraction, by estimation, is  25 to 30%. The left ventricle has severely decreased function. The left ventricle demonstrates global hypokinesis with severe inferior wall hypokinesis. Anterior and anteroseptal wall motion best preserved. The left ventricular internal cavity size was moderately dilated. Left ventricular diastolic parameters are consistent with Grade II diastolic dysfunction (pseudonormalization).  2. Right ventricular systolic function is normal. The right ventricular size is normal. Tricuspid regurgitation signal is inadequate for assessing PA pressure.  3. Left atrial size was moderately dilated.  4. The mitral valve is normal in structure. Moderate mitral valve regurgitation. No evidence of mitral stenosis.  5. The aortic valve is normal in structure. Aortic valve regurgitation is not visualized. No aortic stenosis is present.  6. The inferior vena cava is normal in size with <50% respiratory variability, suggesting right atrial pressure of 8 mmHg. FINDINGS  Left Ventricle: Left ventricular ejection fraction, by estimation, is 25 to 30%. The left ventricle has severely decreased function. The left ventricle demonstrates global hypokinesis. Strain was performed and the global longitudinal strain is indeterminate. The left ventricular internal cavity size was moderately dilated. There is no left ventricular hypertrophy. Left ventricular diastolic parameters are consistent with Grade II diastolic dysfunction (pseudonormalization). Right Ventricle: The right ventricular size is normal. No increase in right ventricular wall thickness. Right ventricular systolic function is normal. Tricuspid regurgitation signal is inadequate for assessing PA  pressure. Left Atrium: Left atrial size was moderately dilated. Right Atrium: Right atrial size was normal in size. Pericardium: There is no evidence of pericardial effusion. Mitral Valve: The mitral valve is normal in structure. There is mild thickening of the mitral valve leaflet(s). There is mild calcification of the mitral valve leaflet(s). Moderate mitral valve regurgitation. No evidence of mitral valve stenosis. Tricuspid Valve: The tricuspid valve is normal in structure. Tricuspid valve regurgitation is mild . No evidence of tricuspid stenosis. Aortic Valve: The aortic valve is normal in structure. Aortic valve regurgitation is not visualized. No aortic stenosis is present. Pulmonic Valve: The pulmonic valve was normal in structure. Pulmonic valve regurgitation is not visualized. No evidence of pulmonic stenosis. Aorta: The aortic root is normal in size and structure. Venous: The inferior vena cava is normal in size with less than 50% respiratory variability, suggesting right atrial pressure of 8 mmHg. IAS/Shunts: No atrial level shunt detected by color flow Doppler. Additional Comments: 3D was performed not requiring image post processing on an independent workstation and was indeterminate.  LEFT VENTRICLE PLAX 2D LVIDd:         6.70 cm   Diastology LVIDs:         5.90 cm   LV e' medial:    6.31 cm/s LV PW:         1.30 cm   LV E/e' medial:  14.8 LV IVS:        1.00 cm   LV e' lateral:   6.85 cm/s LVOT diam:     2.30 cm   LV E/e' lateral: 13.6 LV SV:         49 LV SV Index:   22 LVOT Area:     4.15 cm  RIGHT VENTRICLE             IVC RV Basal diam:  4.10 cm     IVC diam: 1.90 cm RV S prime:     21.60 cm/s TAPSE (M-mode): 3.4 cm LEFT ATRIUM              Index        RIGHT  ATRIUM           Index LA diam:        5.80 cm  2.56 cm/m   RA Area:     16.80 cm LA Vol (A2C):   104.0 ml 45.96 ml/m  RA Volume:   49.10 ml  21.70 ml/m LA Vol (A4C):   108.0 ml 47.72 ml/m LA Biplane Vol: 109.0 ml 48.17 ml/m  AORTIC  VALVE LVOT Vmax:   69.50 cm/s LVOT Vmean:  61.000 cm/s LVOT VTI:    0.118 m  AORTA Ao Root diam: 3.70 cm Ao Asc diam:  3.70 cm MITRAL VALVE MV Area (PHT): 4.39 cm    SHUNTS MV Decel Time: 173 msec    Systemic VTI:  0.12 m MV E velocity: 93.30 cm/s  Systemic Diam: 2.30 cm MV A velocity: 68.10 cm/s MV E/A ratio:  1.37 Evalene Lunger MD Electronically signed by Evalene Lunger MD Signature Date/Time: 10/04/2023/7:52:37 AM    Final    DG Chest 1 View Result Date: 10/04/2023 EXAM: 1 VIEW XRAY OF THE CHEST 10/04/2023 06:31:33 AM COMPARISON: 10/03/2023 CLINICAL HISTORY: CHF (congestive heart failure) (HCC) [address]06. Congestive heart failure, sob FINDINGS: LUNGS AND PLEURA: Bilateral interstitial and airspace opacities, right greater than left, progressed from prior. Possible left pleural effusion. HEART AND MEDIASTINUM: Stable mild cardiomegaly. BONES AND SOFT TISSUES: No acute osseous abnormality. IMPRESSION: 1. Bilateral interstitial and airspace opacities, right greater than left, progressed from prior. In the setting of chf, imaging findings are favored to represent interstitial and alveolar edema. Underlying infection cannot be excluded. 2. Possible left pleural effusion. 3. Stable mild cardiomegaly. Electronically signed by: Waddell Calk MD 10/04/2023 06:39 AM EDT RP Workstation: [id number]   CT Angio Chest PE W/Cm &/Or Wo Cm Result Date: 10/03/2023 CLINICAL DATA:  Pulmonary embolism (PE) suspected, high prob Flu-like symptoms since yesterday. Worsening shortness of breath. History of congestive heart EXAM: CT ANGIOGRAPHY CHEST WITH CONTRAST TECHNIQUE: Multidetector CT imaging of the chest was performed using the standard protocol during bolus administration of intravenous contrast. Multiplanar CT image reconstructions and MIPs were obtained to evaluate the vascular anatomy. RADIATION DOSE REDUCTION: This exam was performed according to the departmental dose-optimization program which includes automated exposure  control, adjustment of the mA and/or kV according to patient size and/or use of iterative reconstruction technique. CONTRAST:  75mL OMNIPAQUE  IOHEXOL  350 MG/ML SOLN COMPARISON:  Same day radiographs.  Chest CTA 08/12/2014. FINDINGS: Cardiovascular: The pulmonary arteries are suboptimally opacified with contrast to the level of the segmental branches. There is no evidence of acute pulmonary embolism. Assessment beyond the segmental level is limited. No acute or significant systemic arterial abnormalities are identified. The heart is mildly enlarged. No significant pericardial fluid. Mediastinum/Nodes: Mildly prominent mediastinal and hilar lymph nodes are similar to the previous study, likely reactive. No axillary lymphadenopathy. The thyroid gland, trachea and esophagus demonstrate no significant findings. Lungs/Pleura: Trace bilateral pleural effusions. No pneumothorax. Pulmonary assessment limited by breathing artifact. As seen on earlier radiographs, there are patchy and confluent ground-glass opacities in both lungs, most consistent with pulmonary edema. There are ill-defined nodular components in the right upper lobe measuring up to 9 mm on image 45/6. Upper abdomen: No significant findings are seen within the visualized upper abdomen. Musculoskeletal/Chest wall: There is no chest wall mass or suspicious osseous finding. Cervicothoracic scoliosis and mild spondylosis noted. Review of the MIP images confirms the above findings. IMPRESSION: 1. No evidence of acute pulmonary embolism or other acute vascular findings in the chest.  2. Patchy and confluent ground-glass opacities in both lungs, most consistent with pulmonary edema or atypical infection. There are ill-defined nodular components in the right upper lobe measuring up to 9 mm which could reflect areas of focal inflammation. Given the patient's age, no specific follow-up imaging is recommended unless clinically warranted. 3. Trace bilateral pleural  effusions. 4. Mildly prominent mediastinal and hilar lymph nodes, similar to previous study and likely reactive. Electronically Signed   By: Elsie Perone M.D.   On: 10/03/2023 09:20   DG Chest 2 View Result Date: 10/03/2023 EXAM: 2 VIEW(S) XRAY OF THE CHEST 10/03/2023 07:17:16 AM COMPARISON: None available. CLINICAL HISTORY: SOB. Pt reports yesterday he developed flu like symptoms, pt reports while trying to lay down last night his SOB got worse. Pt reports he has hx CHF. FINDINGS: LUNGS AND PLEURA: Bilateral interstitial prominence. Mild pulmonary edema. No focal pulmonary opacity. No pleural effusion. No pneumothorax. HEART AND MEDIASTINUM: Mild cardiomegaly. No acute abnormality of the mediastinal silhouette. BONES AND SOFT TISSUES: No acute osseous abnormality. IMPRESSION: 1. Mild pulmonary edema with diffuse interstitial prominence. 2. Mild cardiomegaly. Electronically signed by: Waddell Calk MD 10/03/2023 07:38 AM EDT RP Workstation: [id number]    Microbiology: Results for orders placed or performed during the hospital encounter of 10/03/23  Resp panel by RT-PCR (RSV, Flu A&B, Covid) Anterior Nasal Swab     Status: None   Collection Time: 10/03/23  7:02 AM   Specimen: Anterior Nasal Swab  Result Value Ref Range Status   SARS Coronavirus 2 by RT PCR NEGATIVE NEGATIVE Final    Comment: (NOTE) SARS-CoV-2 target nucleic acids are NOT DETECTED.  The SARS-CoV-2 RNA is generally detectable in upper respiratory specimens during the acute phase of infection. The lowest concentration of SARS-CoV-2 viral copies this assay can detect is 138 copies/mL. A negative result does not preclude SARS-Cov-2 infection and should not be used as the sole basis for treatment or other patient management decisions. A negative result may occur with  improper specimen collection/handling, submission of specimen other than nasopharyngeal swab, presence of viral mutation(s) within the areas targeted by this assay,  and inadequate number of viral copies(<138 copies/mL). A negative result must be combined with clinical observations, patient history, and epidemiological information. The expected result is Negative.  Fact Sheet for Patients:  BloggerCourse.com  Fact Sheet for Healthcare Providers:  SeriousBroker.it  This test is no t yet approved or cleared by the [place]  FDA and  has been authorized for detection and/or diagnosis of SARS-CoV-2 by FDA under an Emergency Use Authorization (EUA). This EUA will remain  in effect (meaning this test can be used) for the duration of the COVID-19 declaration under Section 564(b)(1) of the Act, 21 U.S.C.section 360bbb-3(b)(1), unless the authorization is terminated  or revoked sooner.       Influenza A by PCR NEGATIVE NEGATIVE Final   Influenza B by PCR NEGATIVE NEGATIVE Final    Comment: (NOTE) The Xpert Xpress SARS-CoV-2/FLU/RSV plus assay is intended as an aid in the diagnosis of influenza from Nasopharyngeal swab specimens and should not be used as a sole basis for treatment. Nasal washings and aspirates are unacceptable for Xpert Xpress SARS-CoV-2/FLU/RSV testing.  Fact Sheet for Patients: BloggerCourse.com  Fact Sheet for Healthcare Providers: SeriousBroker.it  This test is not yet approved or cleared by the [place]  FDA and has been authorized for detection and/or diagnosis of SARS-CoV-2 by FDA under an Emergency Use Authorization (EUA). This EUA will remain in effect (meaning this test  can be used) for the duration of the COVID-19 declaration under Section 564(b)(1) of the Act, 21 U.S.C. section 360bbb-3(b)(1), unless the authorization is terminated or revoked.     Resp Syncytial Virus by PCR NEGATIVE NEGATIVE Final    Comment: (NOTE) Fact Sheet for Patients: BloggerCourse.com  Fact Sheet for  Healthcare Providers: SeriousBroker.it  This test is not yet approved or cleared by the [place]  FDA and has been authorized for detection and/or diagnosis of SARS-CoV-2 by FDA under an Emergency Use Authorization (EUA). This EUA will remain in effect (meaning this test can be used) for the duration of the COVID-19 declaration under Section 564(b)(1) of the Act, 21 U.S.C. section 360bbb-3(b)(1), unless the authorization is terminated or revoked.  Performed at [facility], [address] [place], [place] [address]     Labs: CBC: Recent Labs  Lab 10/03/23 [phone] 10/04/23 0301 10/05/23 0412 10/06/23 0403 10/06/23 0959 10/07/23 0547  WBC 12.0* 13.2* 13.3* 9.8  --  9.6  NEUTROABS 8.1*  --   --   --   --   --   HGB 15.7 16.7 15.9 16.3 17.0 17.2*  HCT 46.5 48.6 47.0 48.7 50.0 50.9  MCV 91.5 87.9 89.5 89.9  --  87.6  PLT 156 171 163 201  --  247   Basic Metabolic Panel: Recent Labs  Lab 10/03/23 0702 10/03/23 0857 10/04/23 0301 10/05/23 0412 10/06/23 0403 10/06/23 0959 10/07/23 0547  NA 137  --  138 137 138 138 133*  K 3.3*  --  3.3* 3.9 3.4* 3.5 3.5  CL 104  --  105 103 101  --  101  CO2 21*  --  23 23 23   --  21*  GLUCOSE 104*  --  107* 103* 99  --  103*  BUN 10  --  11 17 19   --  21*  CREATININE 0.87  --  0.73 0.85 0.92  --  1.04  CALCIUM  8.7*  --  9.0 8.9 8.9  --  9.0  MG  --  1.8  --   --   --   --   --    Liver Function Tests: Recent Labs  Lab 10/03/23 0702  AST 91*  ALT 45*  ALKPHOS 36*  BILITOT 0.9  PROT 6.5  ALBUMIN 3.6   CBG: No results for input(s): GLUCAP in the last 168 hours.  Discharge time spent: greater than 30 minutes.  Signed: Delon Herald, MD [facility] 10/07/2023

## 2023-10-07 NOTE — Plan of Care (Signed)

## 2023-10-07 NOTE — Progress Notes (Addendum)
 Cardiology Progress Note   Patient Name: Terrance Hawkins Date of Encounter: 10/07/2023  Primary Cardiologist: Lonni Hanson, MD  Subjective   No chest pain or sob.  Ambulating w/o difficulty/symptoms.  Eager to go home. Objective   Inpatient Medications    Scheduled Meds:  aspirin  EC  81 mg Oral Daily   atorvastatin   40 mg Oral Daily   azithromycin   500 mg Oral Daily   carvedilol   6.25 mg Oral BID   clopidogrel   75 mg Oral Q breakfast   empagliflozin   10 mg Oral Daily   enoxaparin  (LOVENOX ) injection  40 mg Subcutaneous Q24H   fluticasone   2 spray Each Nare Daily   furosemide   40 mg Oral Daily   guaiFENesin   600 mg Oral BID   loratadine   10 mg Oral Daily   sacubitril -valsartan   1 tablet Oral BID   sodium chloride  flush  3 mL Intravenous Q12H   sodium chloride  flush  3 mL Intravenous Q12H   spironolactone   25 mg Oral Daily   Continuous Infusions:  sodium chloride      cefTRIAXone  (ROCEPHIN )  IV 2 g (10/07/23 0823)   PRN Meds: sodium chloride , acetaminophen , chlorpheniramine-HYDROcodone , cyclobenzaprine , LORazepam , ondansetron  (ZOFRAN ) IV, mouth rinse, sodium chloride  flush, sodium chloride  flush, traZODone    Vital Signs    Vitals:   10/07/23 0000 10/07/23 0416 10/07/23 0500 10/07/23 0808  BP: 108/77 126/80  (!) 143/117  Pulse: 84 84  93  Resp: 20 20  18   Temp: 98.6 F (37 C) 98.4 F (36.9 C)  98.2 F (36.8 C)  TempSrc:  Oral    SpO2: (!) 89% 100%  99%  Weight:   113.5 kg   Height:        Intake/Output Summary (Last 24 hours) at 10/07/2023 0846 Last data filed at 10/07/2023 0500 Gross per 24 hour  Intake 610 ml  Output 2575 ml  Net -1965 ml   Filed Weights   10/05/23 0900 10/06/23 0652 10/07/23 0500  Weight: 115.2 kg 114 kg 113.5 kg    Physical Exam   GEN: Well nourished, well developed, in no acute distress.  HEENT: Grossly normal.  Neck: Supple, no JVD, carotid bruits, or masses. Cardiac: RRR, no murmurs, rubs, or gallops. No clubbing,  cyanosis, edema.  Radials 2+, DP/PT 2+ and equal bilaterally. R radial and brachial cath sites w/o bleeding/bruit/hematoma. Respiratory:  Respirations regular and unlabored, clear to auscultation bilaterally. GI: Soft, nontender, nondistended, BS + x 4. MS: no deformity or atrophy. Skin: warm and dry, no rash. Neuro:  Strength and sensation are intact. Psych: AAOx3.  Normal affect.  Labs    Chemistry Recent Labs  Lab 10/03/23 [phone] 10/04/23 0301 10/05/23 0412 10/06/23 0403 10/06/23 0959 10/07/23 0547  NA 137   < > 137 138 138 133*  K 3.3*   < > 3.9 3.4* 3.5 3.5  CL 104   < > 103 101  --  101  CO2 21*   < > 23 23  --  21*  GLUCOSE 104*   < > 103* 99  --  103*  BUN 10   < > 17 19  --  21*  CREATININE 0.87   < > 0.85 0.92  --  1.04  CALCIUM  8.7*   < > 8.9 8.9  --  9.0  PROT 6.5  --   --   --   --   --   ALBUMIN 3.6  --   --   --   --   --  AST 91*  --   --   --   --   --   ALT 45*  --   --   --   --   --   ALKPHOS 36*  --   --   --   --   --   BILITOT 0.9  --   --   --   --   --   GFRNONAA >60   < > >60 >60  --  >60  ANIONGAP 12   < > 11 14  --  11   < > = values in this interval not displayed.     Hematology Recent Labs  Lab 10/05/23 0412 10/06/23 0403 10/06/23 0959 10/07/23 0547  WBC 13.3* 9.8  --  9.6  RBC 5.25 5.42  --  5.81  HGB 15.9 16.3 17.0 17.2*  HCT 47.0 48.7 50.0 50.9  MCV 89.5 89.9  --  87.6  MCH 30.3 30.1  --  29.6  MCHC 33.8 33.5  --  33.8  RDW 13.0 12.8  --  12.7  PLT 163 201  --  247    Cardiac Enzymes  Recent Labs  Lab 10/03/23 0702 10/03/23 0857 10/03/23 1402 10/04/23 0610  TROPONINIHS 15,417* 14,248* 14,004* 9,054*      BNP    Component Value Date/Time   BNP 384.0 (H) 10/03/2023 0702   Lipids  Lab Results  Component Value Date   CHOL 150 09/08/2014   HDL 29 (L) 09/08/2014   LDLCALC 95 09/08/2014   TRIG 132 09/08/2014   CHOLHDL 5.2 09/08/2014    HbA1c  Lab Results  Component Value Date   HGBA1C 5.3 03/19/2017     Radiology    DG Chest 1 View Result Date: 10/04/2023 EXAM: 1 VIEW XRAY OF THE CHEST 10/04/2023 06:31:33 AM COMPARISON: 10/03/2023 CLINICAL HISTORY: CHF (congestive heart failure) (HCC) [address]2706. Congestive heart failure, sob FINDINGS: LUNGS AND PLEURA: Bilateral interstitial and airspace opacities, right greater than left, progressed from prior. Possible left pleural effusion. HEART AND MEDIASTINUM: Stable mild cardiomegaly. BONES AND SOFT TISSUES: No acute osseous abnormality. IMPRESSION: 1. Bilateral interstitial and airspace opacities, right greater than left, progressed from prior. In the setting of chf, imaging findings are favored to represent interstitial and alveolar edema. Underlying infection cannot be excluded. 2. Possible left pleural effusion. 3. Stable mild cardiomegaly. Electronically signed by: Waddell Calk MD 10/04/2023 06:39 AM EDT RP Workstation: [id number]   CT Angio Chest PE W/Cm &/Or Wo Cm Result Date: 10/03/2023 CLINICAL DATA:  Pulmonary embolism (PE) suspected, high prob Flu-like symptoms since yesterday. Worsening shortness of breath. History of congestive heart EXAM: CT ANGIOGRAPHY CHEST WITH CONTRAST TECHNIQUE: Multidetector CT imaging of the chest was performed using the standard protocol during bolus administration of intravenous contrast. Multiplanar CT image reconstructions and MIPs were obtained to evaluate the vascular anatomy. RADIATION DOSE REDUCTION: This exam was performed according to the departmental dose-optimization program which includes automated exposure control, adjustment of the mA and/or kV according to patient size and/or use of iterative reconstruction technique. CONTRAST:  75mL OMNIPAQUE  IOHEXOL  350 MG/ML SOLN COMPARISON:  Same day radiographs.  Chest CTA 08/12/2014. FINDINGS: Cardiovascular: The pulmonary arteries are suboptimally opacified with contrast to the level of the segmental branches. There is no evidence of acute pulmonary embolism. Assessment  beyond the segmental level is limited. No acute or significant systemic arterial abnormalities are identified. The heart is mildly enlarged. No significant pericardial fluid. Mediastinum/Nodes: Mildly prominent mediastinal and hilar lymph nodes are  similar to the previous study, likely reactive. No axillary lymphadenopathy. The thyroid gland, trachea and esophagus demonstrate no significant findings. Lungs/Pleura: Trace bilateral pleural effusions. No pneumothorax. Pulmonary assessment limited by breathing artifact. As seen on earlier radiographs, there are patchy and confluent ground-glass opacities in both lungs, most consistent with pulmonary edema. There are ill-defined nodular components in the right upper lobe measuring up to 9 mm on image 45/6. Upper abdomen: No significant findings are seen within the visualized upper abdomen. Musculoskeletal/Chest wall: There is no chest wall mass or suspicious osseous finding. Cervicothoracic scoliosis and mild spondylosis noted. Review of the MIP images confirms the above findings. IMPRESSION: 1. No evidence of acute pulmonary embolism or other acute vascular findings in the chest. 2. Patchy and confluent ground-glass opacities in both lungs, most consistent with pulmonary edema or atypical infection. There are ill-defined nodular components in the right upper lobe measuring up to 9 mm which could reflect areas of focal inflammation. Given the patient's age, no specific follow-up imaging is recommended unless clinically warranted. 3. Trace bilateral pleural effusions. 4. Mildly prominent mediastinal and hilar lymph nodes, similar to previous study and likely reactive. Electronically Signed   By: Elsie Perone M.D.   On: 10/03/2023 09:20     Telemetry    RSR, 70's-90's - Personally Reviewed  Cardiac Studies   2D Echocardiogram 9.9.2025  1. Left ventricular ejection fraction, by estimation, is 25 to 30%. The  left ventricle has severely decreased function. The  left ventricle  demonstrates global hypokinesis with severe inferior wall hypokinesis.  Anterior and anteroseptal wall motion best  preserved. The left ventricular internal cavity size was moderately  dilated. Left ventricular diastolic parameters are consistent with Grade  II diastolic dysfunction (pseudonormalization).   2. Right ventricular systolic function is normal. The right ventricular  size is normal. Tricuspid regurgitation signal is inadequate for assessing  PA pressure.   3. Left atrial size was moderately dilated.   4. The mitral valve is normal in structure. Moderate mitral valve  regurgitation. No evidence of mitral stenosis.   5. The aortic valve is normal in structure. Aortic valve regurgitation is  not visualized. No aortic stenosis is present.   6. The inferior vena cava is normal in size with <50% respiratory  variability, suggesting right atrial pressure of 8 mmHg.  _____________   Cardiac Catheterization  9.12.2025  Diagnostic Dominance: Co-dominant    Dist Cx lesion is 100% stenosed.   Mid LAD lesion is 40% stenosed.   1.  Codominant coronary arteries with occluded distal left circumflex which is a likely culprit for recent non-STEMI.  Collaterals are noted to the PL branches. 2.  Left ventricular angiography was not performed.  EF was severely reduced by echo. 3.  Right heart catheterization showed normal right atrial pressure, mildly elevated wedge pressure, minimal pulmonary hypertension and mildly reduced cardiac output.   Recommendations: The left circumflex has been occluded for more than 48 hours with collaterals and no angina at this time.  Recommend medical therapy.  I added clopidogrel  to be used for 1 year. I switched IV furosemide  to oral. Recommend medical therapy for coronary artery disease and heart failure. _____________   Patient Profile     33 y.o. male w/ a h/o HFmrEF, HTN, hyperparathyroidism, ongoing polysubstance abuse, and obesity,  who was admitted 9/9, w/ worsening CHF and LV dysfxn (EF 25-30%) and NSTEMI. Cath 9/12 showed occluded LCX and mod LAD dzs  Med rx.  Assessment & Plan  1.  Acute HFrEF/ICM:  Prior h/o HFmrEF w/ worsening of EF noted on this admission in the setting of ongoing HTN and polysubstance abuse (EF 25-30%).  Cath w/ occluded LCX  Med rx.  PCWP/LVEDP mildly elevated @  61mmHg/17mmH respectively.  Appears euvolemic on exam.  Cont ? blocker, entresto , spiro, jardiance , and lasix .  Has heart failure clinic f/u on 9/17 and will need ongoing mgmt via advanced HF team.  Plan to f/u echo in ~ 40 days to re-eval EF on max tolerated GDMT, pending compliance.  We discussed that ongoing drug use would make him a poor candidate for ICD in the future if deemed to be necessary.  2.  CAD/NSTEMI:  HsTrop 15,417 on admission.  No c/p.  S/p cath 9/12 w/ finding of occluded LCX w/ collats to PL branches, and mod LAD dzs.  Plan for med rx.  No c/p.  Ambulating w/o difficulty.  He remains on asa, statin, ? blocker, plavix  (12 mos).  3.  Primary HTN:  BP trended well yesterday, recorded as higher this AM.  F/u after AM meds.  Cont ? blocker, entresto , spiro, and oral lasix .  4.  OSA:  Pt has had sleep study and dx w/ OSA.  He is scheduled for in-lab initiation and titration of cpap and says that he just needs to check w/ PCP re: dates.  Strongly encouraged him to follow-through as he was noted to have apneic spells during cath and untreated OSA is likely playing a role in HF.  5.  HL:  Lipids not checked this admission - will see if I can add on.  Cont high potency statin rx.  6.  Polysubstance abuse:  Complete cessation advised - tobacco (cigars)/drugs/alcohol.  7.  Moderate MR:  likely functional in setting of LV dysfxn.  I do not appreciate a murmur this AM.  Will need outpt f/u imaging in the future.  8.  Fever/? Resp infection:  afebrile.  Breathing markedly improved.  On ceftriaxone /azithromycin .  Signed, Lonni Meager, NP  10/07/2023, 8:46 AM    For questions or updates, please contact   Please consult [email or web address] for contact info under Cardiology/STEMI.

## 2023-10-08 LAB — POCT I-STAT 7, (LYTES, BLD GAS, ICA,H+H)
Acid-Base Excess: 0 mmol/L (ref 0.0–2.0)
Bicarbonate: 22.4 mmol/L (ref 20.0–28.0)
Calcium, Ion: 1.18 mmol/L (ref 1.15–1.40)
HCT: 49 % (ref 39.0–52.0)
Hemoglobin: 16.7 g/dL (ref 13.0–17.0)
O2 Saturation: 88 %
Potassium: 3.5 mmol/L (ref 3.5–5.1)
Sodium: 138 mmol/L (ref 135–145)
TCO2: 23 mmol/L (ref 22–32)
pCO2 arterial: 30.7 mmHg — ABNORMAL LOW (ref 32–48)
pH, Arterial: 7.47 — ABNORMAL HIGH (ref 7.35–7.45)
pO2, Arterial: 49 mmHg — ABNORMAL LOW (ref 83–108)

## 2023-10-09 LAB — LIPOPROTEIN A (LPA): Lipoprotein (a): 285.8 nmol/L — ABNORMAL HIGH (ref <75.0)

## 2023-10-10 ENCOUNTER — Telehealth: Payer: Self-pay | Admitting: Family

## 2023-10-10 NOTE — Progress Notes (Unsigned)
 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 Until recent admission, he hadn't seen a medical provider or taken any medications in ~5 years.   Admitted 10/03/23 with shortness of breath. CXR with pulmonary congestion, troponin 15,000 with negative delta. Heparin  drip and IV lasix  started. Cardiology consulted. Echo 10/03/23: EF 25-30% with grade 2 DD and concern for inferior WMA (but also with global hypokinesis). R/LHC 10/06/23:   Dist Cx lesion is 100% stenosed.   Mid LAD lesion is 40% stenosed. 1. Codominant coronary arteries with occluded distal left circumflex which is a likely culprit for recent non-STEMI. Collaterals are noted to the PL branches. 2. Left ventricular angiography was not performed.  EF was severely reduced by echo. 3. Right heart catheterization showed normal right atrial pressure, mildly elevated wedge pressure, minimal pulmonary hypertension and mildly reduced cardiac output. Initially needed 15L nonbreather but able to be weaned to room air. UDS + cocaine, THC.   He presents today for his initial TOC HF visit with a chief complaint of chronic knee pain. Sleeping well on 1-2 pillows. Chronic decreased appetite. Denies shortness of breath, fatigue, palpitations, chest pain, edema or dizziness. Overall he says that he's feeling great except for his severe chronic knee pain. Has all his medications and has taken them today.   Saw PCP 2 days ago and he reports that his SBP was 124. PCP resumed his venlafaxine. Smoking 1-2 cigars daily prior to admission, now he's not even smoking a whole cigar. Rare alcohol use. Used cocaine once since discharge and it was the day he was  discharged. He says that when his anxiety/ pain is worse, he would use cocaine but understands how detrimental to his health it is.   Review of Systems: [y] = yes, [ ]  = no   General: Weight gain [ ] ; Weight loss [ ] ; Anorexia [ ] ; Fatigue [ ] ; Fever [ ] ; Chills [ ] ; Weakness [ ]   Cardiac: Chest pain/pressure [ ] ; Resting SOB [ ] ; Exertional SOB [ ] ; Orthopnea [ ] ; Pedal Edema [ ] ; Palpitations [ ] ; Syncope [ ] ; Presyncope [ ] ; Paroxysmal nocturnal dyspnea[ ]   Pulmonary: Cough [ ] ; Wheezing[ ] ; Hemoptysis[ ] ; Sputum [ ] ; Snoring [ ]   GI: Vomiting[ ] ; Dysphagia[ ] ; Melena[ ] ; Hematochezia [ ] ; Heartburn[ ] ; Abdominal pain [ ] ; Constipation [ ] ; Diarrhea [ ] ; BRBPR [ ]   GU: Hematuria[ ] ; Dysuria [ ] ; Nocturia[ ]   Vascular: Pain in legs with walking [ ] ; Pain in feet with lying flat [ ] ; Non-healing sores [ ] ; Stroke [ ] ; TIA [ ] ; Slurred speech [ ] ;  Neuro: Headaches[ ] ; Vertigo[ ] ; Seizures[ ] ; Paresthesias[ ] ;Blurred vision [ ] ; Diplopia [ ] ; Vision changes [ ]   Ortho/Skin: Arthritis [ ] ; Joint pain Davis.Dad ]; Muscle pain [ ] ; Joint swelling [ ] ; Back Pain [ ] ; Rash [ ]   Psych: Depression[ ] ; Anxiety[ ]   Heme: Bleeding problems [ ] ; Clotting disorders [ ] ; Anemia [ ]   Endocrine: Diabetes [ ] ; Thyroid dysfunction[ ]    Past Medical History:  Diagnosis Date   CHF (congestive heart failure) (HCC)    Hypertension     Current Outpatient Medications  Medication Sig Dispense Refill  aspirin  EC 81 MG tablet Take 1 tablet (81 mg total) by mouth daily. Swallow whole. 30 tablet 12   atorvastatin  (LIPITOR) 40 MG tablet Take 1 tablet (40 mg total) by mouth daily. 30 tablet 0   carvedilol  (COREG ) 6.25 MG tablet Take 1 tablet (6.25 mg total) by mouth 2 (two) times daily. 60 tablet 0   clopidogrel  (PLAVIX ) 75 MG tablet Take 1 tablet (75 mg total) by mouth daily with breakfast. 30 tablet 0   doxycycline  (VIBRA -TABS) 100 MG tablet Take 1 tablet (100 mg total) by mouth 2 (two) times daily for 5 days. 10 tablet  0   empagliflozin  (JARDIANCE ) 10 MG TABS tablet Take 1 tablet (10 mg total) by mouth daily. 30 tablet 0   furosemide  (LASIX ) 40 MG tablet Take 1 tablet (40 mg total) by mouth daily. 30 tablet 0   sacubitril -valsartan  (ENTRESTO ) 49-51 MG Take 1 tablet by mouth 2 (two) times daily. 60 tablet 0   spironolactone  (ALDACTONE ) 25 MG tablet Take 1 tablet (25 mg total) by mouth daily. 30 tablet 0   No current facility-administered medications for this visit.    Allergies  Allergen Reactions   Lisinopril  Other (See Comments)      Social History   Socioeconomic History   Marital status: Single    Spouse name: Not on file   Number of children: Not on file   Years of education: Not on file   Highest education level: Not on file  Occupational History   Not on file  Tobacco Use   Smoking status: Every Day    Current packs/day: 0.50    Types: Cigarettes   Smokeless tobacco: Never  Substance and Sexual Activity   Alcohol use: Not Currently    Comment: Pt does drink beer freqently.  No specific amount given.  Just said the whole box.  Could be 6-24 .   Drug use: Yes    Frequency: 4.0 times per week    Types: Marijuana, Cocaine   Sexual activity: Not on file  Other Topics Concern   Not on file  Social History Narrative   Not on file   Social Drivers of Health   Financial Resource Strain: High Risk (10/03/2023)   Overall Financial Resource Strain (CARDIA)    Difficulty of Paying Living Expenses: Hard  Food Insecurity: Unknown (10/03/2023)   Hunger Vital Sign    Worried About Running Out of Food in the Last Year: Never true    Ran Out of Food in the Last Year: Not on file  Transportation Needs: No Transportation Needs (10/03/2023)   PRAPARE - Administrator, Civil Service (Medical): No    Lack of Transportation (Non-Medical): No  Physical Activity: Not on file  Stress: Not on file  Social Connections: Not on file  Intimate Partner Violence: Not At Risk (10/04/2023)    Humiliation, Afraid, Rape, and Kick questionnaire    Fear of Current or Ex-Partner: No    Emotionally Abused: No    Physically Abused: No    Sexually Abused: No      Family History  Problem Relation Age of Onset   Diabetes Mother    Kidney failure Mother    Hypertension Father    Diabetes Maternal Grandmother    Breast cancer Paternal Grandmother    Vitals:   10/11/23 1203  BP: (!) 153/103  Pulse: 67  SpO2: 98%  Weight: 260 lb (117.9 kg)   Wt Readings from Last 3 Encounters:  10/11/23 260  lb (117.9 kg)  10/07/23 250 lb 3.2 oz (113.5 kg)  11/04/22 300 lb (136.1 kg)   Lab Results  Component Value Date   CREATININE 1.04 10/07/2023   CREATININE 0.92 10/06/2023   CREATININE 0.85 10/05/2023   PHYSICAL EXAM:  General: Well appearing.  Cor: No JVD. Regular rhythm, rate.  Lungs: clear Abdomen: soft, nontender, nondistended. Extremities: no edema Neuro:. Affect pleasant   ECG:   ASSESSMENT & PLAN:  1: Ischemic heart failure with reduced ejection fraction- - NSTEMI 09/25 (cath results below) - NYHA class I - euvolemic - weighing daily. Parameters to call about discussed - Echo 10/03/23: EF 25-30% with grade 2 DD and concern for inferior WMA (but also with global hypokinesis).  - will need to repeat echo in a few months after being on GDMT consistently. May qualify for advance therapies if he can abstain from cocaine.  - continue carvedilol  6.25mg  BID. Previously on higher dose but could not tolerate as it made him feel bad - continue jardiance  10mg  daily - continue furosemide  40mg  daily - continue entresto  49/51mg  BID. Consider titration. Lisinopril  reaction was that he felt bad, no angioedema or cough.  - continue spironolactone  25mg  daily - May need bidil although TID dosing may be inconsistent - BNP 10/03/23 was 384.0  2: NSTEMI- - R/LHC 10/06/23:   Dist Cx lesion is 100% stenosed.   Mid LAD lesion is 40% stenosed. 1. Codominant coronary arteries with occluded  distal left circumflex which is a likely culprit for recent non-STEMI. Collaterals are noted to the PL branches. 2. Left ventricular angiography was not performed.  EF was severely reduced by echo. 3. Right heart catheterization showed normal right atrial pressure, mildly elevated wedge pressure, minimal pulmonary hypertension and mildly reduced cardiac output. - continue plavix  75mg  daily - continue ASA 81mg  daily  3: HTN- - BP 153/103, still elevated on recheck. He says that he's in a lot of pain due to his severe knee pain and thath he had to walk in to the office from the parking lot  - Reports SBP at PCP office 2 days ago was 124. - BMET 10/07/23 reviewed: sodium 133, potassium 3.5, creatinine 1.04, GFR >60. BMET next visit  4: Polysubstance use- - UDS 10/03/23 was + cocaine/ THC - did cocaine on day of discharge 4 days ago. None since and reports that he understands the detrimental effects of using cocaine. - rare alcohol use - was smoking 1-2 cigars daily but now isn't smoking 1 whole cigar  5: Hyperlipidemia- - continue atorvastatin  40mg  daily - LDL 10/07/23 was 97 with lipo (a) 285.8   Return in a few weeks to see HF MD, sooner if needed.   I spent 53 minutes reviewing records, interviewing/ examing patient and managing plan/ orders.    Ellouise DELENA Class, FNP 10/10/23

## 2023-10-10 NOTE — Telephone Encounter (Signed)
 Called to confirm/remind patient of their appointment at the Advanced [facility] Failure Clinic on 10/11/23.   Appointment:   [x] Confirmed  [] Left mess   [] No answer/No voice mail  [] VM Full/unable to leave message  [] Phone not in service  Patient reminded to bring all medications and/or complete list.  Confirmed patient has transportation. Gave directions, instructed to utilize valet parking.

## 2023-10-11 ENCOUNTER — Encounter: Payer: Self-pay | Admitting: Family

## 2023-10-11 ENCOUNTER — Ambulatory Visit: Attending: Family | Admitting: Family

## 2023-10-11 VITALS — BP 153/103 | HR 67 | Wt 260.0 lb

## 2023-10-11 DIAGNOSIS — M79606 Pain in leg, unspecified: Secondary | ICD-10-CM | POA: Diagnosis present

## 2023-10-11 DIAGNOSIS — I214 Non-ST elevation (NSTEMI) myocardial infarction: Secondary | ICD-10-CM

## 2023-10-11 DIAGNOSIS — I252 Old myocardial infarction: Secondary | ICD-10-CM | POA: Diagnosis not present

## 2023-10-11 DIAGNOSIS — Z7984 Long term (current) use of oral hypoglycemic drugs: Secondary | ICD-10-CM | POA: Insufficient documentation

## 2023-10-11 DIAGNOSIS — F1910 Other psychoactive substance abuse, uncomplicated: Secondary | ICD-10-CM

## 2023-10-11 DIAGNOSIS — E785 Hyperlipidemia, unspecified: Secondary | ICD-10-CM | POA: Diagnosis not present

## 2023-10-11 DIAGNOSIS — F17290 Nicotine dependence, other tobacco product, uncomplicated: Secondary | ICD-10-CM | POA: Insufficient documentation

## 2023-10-11 DIAGNOSIS — Z7902 Long term (current) use of antithrombotics/antiplatelets: Secondary | ICD-10-CM | POA: Diagnosis not present

## 2023-10-11 DIAGNOSIS — I110 Hypertensive heart disease with heart failure: Secondary | ICD-10-CM | POA: Insufficient documentation

## 2023-10-11 DIAGNOSIS — F419 Anxiety disorder, unspecified: Secondary | ICD-10-CM | POA: Diagnosis not present

## 2023-10-11 DIAGNOSIS — I5022 Chronic systolic (congestive) heart failure: Secondary | ICD-10-CM | POA: Diagnosis not present

## 2023-10-11 DIAGNOSIS — F1490 Cocaine use, unspecified, uncomplicated: Secondary | ICD-10-CM | POA: Insufficient documentation

## 2023-10-11 DIAGNOSIS — Z79899 Other long term (current) drug therapy: Secondary | ICD-10-CM | POA: Insufficient documentation

## 2023-10-11 DIAGNOSIS — G8929 Other chronic pain: Secondary | ICD-10-CM | POA: Insufficient documentation

## 2023-10-11 DIAGNOSIS — M25569 Pain in unspecified knee: Secondary | ICD-10-CM | POA: Diagnosis not present

## 2023-10-11 DIAGNOSIS — I10 Essential (primary) hypertension: Secondary | ICD-10-CM

## 2023-10-11 DIAGNOSIS — E782 Mixed hyperlipidemia: Secondary | ICD-10-CM

## 2023-10-11 NOTE — Patient Instructions (Addendum)
 It was nice to meet you today.   Medication Changes:  No medication changes today!   Follow-Up in: Please follow up with the Advanced Heart Failure Clinic in 1 month with Dr. Gardenia.    Thank you for choosing  [facility].    At the Advanced Heart Failure Clinic, you and your health needs are our priority. We have a designated team specialized in the treatment of Heart Failure. This Care Team includes your primary Heart Failure Specialized Cardiologist (physician), Advanced Practice Providers (APPs- Physician Assistants and Nurse Practitioners), and Pharmacist who all work together to provide you with the care you need, when you need it.   You may see any of the following providers on your designated Care Team at your next follow up:  Dr. Toribio Fuel Dr. Ezra Shuck Dr. Ria Gardenia Dr. Morene Brownie Ellouise Class, FNP Jaun Bash, RPH-CPP  Please be sure to bring in all your medications bottles to every appointment.   Need to Contact Us :  If you have any questions or concerns before your next appointment please send us  a message through [place] or call our office at [phone].    TO LEAVE A MESSAGE FOR THE NURSE SELECT OPTION 2, PLEASE LEAVE A MESSAGE INCLUDING: YOUR NAME DATE OF BIRTH CALL BACK NUMBER REASON FOR CALL**this is important as we prioritize the call backs  YOU WILL RECEIVE A CALL BACK THE SAME DAY AS LONG AS YOU CALL BEFORE 4:00 PM

## 2023-11-03 ENCOUNTER — Encounter: Admitting: Cardiology

## 2023-11-11 ENCOUNTER — Other Ambulatory Visit: Payer: Self-pay

## 2023-11-14 ENCOUNTER — Telehealth: Payer: Self-pay | Admitting: Family

## 2023-11-14 ENCOUNTER — Other Ambulatory Visit: Payer: Self-pay

## 2023-11-14 MED ORDER — SPIRONOLACTONE 25 MG PO TABS: 25.0000 mg | ORAL_TABLET | Freq: Every day | ORAL | 5 refills | Status: AC

## 2023-11-14 MED ORDER — ATORVASTATIN CALCIUM 40 MG PO TABS: 40.0000 mg | ORAL_TABLET | Freq: Every day | ORAL | 5 refills | Status: AC

## 2023-11-14 MED ORDER — FUROSEMIDE 40 MG PO TABS: 40.0000 mg | ORAL_TABLET | Freq: Every day | ORAL | 5 refills | Status: AC

## 2023-11-14 MED ORDER — EMPAGLIFLOZIN 10 MG PO TABS: 10.0000 mg | ORAL_TABLET | Freq: Every day | ORAL | 5 refills | Status: AC

## 2023-11-14 MED ORDER — SACUBITRIL-VALSARTAN 49-51 MG PO TABS: 1.0000 | ORAL_TABLET | Freq: Two times a day (BID) | ORAL | 5 refills | Status: AC

## 2023-11-14 MED ORDER — CARVEDILOL 6.25 MG PO TABS: 6.2500 mg | ORAL_TABLET | Freq: Two times a day (BID) | ORAL | 5 refills | Status: AC

## 2023-11-14 MED ORDER — CLOPIDOGREL BISULFATE 75 MG PO TABS: 75.0000 mg | ORAL_TABLET | Freq: Every day | ORAL | 5 refills | Status: AC

## 2023-11-14 NOTE — Telephone Encounter (Signed)
 All medications that we prescribe were refilled to preferred pharmacy.

## 2023-11-15 ENCOUNTER — Other Ambulatory Visit (HOSPITAL_COMMUNITY): Payer: Self-pay

## 2023-11-15 NOTE — Telephone Encounter (Signed)
 Error

## 2023-11-23 ENCOUNTER — Encounter: Attending: Cardiovascular Disease | Admitting: Emergency Medicine

## 2023-11-23 ENCOUNTER — Other Ambulatory Visit: Payer: Self-pay

## 2023-11-23 ENCOUNTER — Encounter: Payer: Self-pay | Admitting: Emergency Medicine

## 2023-11-23 DIAGNOSIS — I214 Non-ST elevation (NSTEMI) myocardial infarction: Secondary | ICD-10-CM

## 2023-11-23 NOTE — Progress Notes (Signed)
 Terrance Hawkins is a current tobacco user. Intervention for tobacco cessation was provided at the initial medical review. He was asked about readiness to quit and reported he has decreased use but is not ready to quit completely . Patient was advised and educated about tobacco cessation using combination therapy, tobacco cessation classes, quit line, and quit smoking apps. Patient demonstrated understanding of this material. Staff will continue to provide encouragement and follow up with the patient throughout the program.

## 2023-11-23 NOTE — Progress Notes (Signed)
 Initial phone call completed. Diagnosis can be found in [facility] 10/11/23. EP Orientation scheduled for 11/30/23 at 14:00.

## 2023-11-24 ENCOUNTER — Telehealth: Payer: Self-pay | Admitting: Family

## 2023-11-24 ENCOUNTER — Encounter: Admitting: Cardiology

## 2023-11-24 NOTE — Telephone Encounter (Signed)
 Called to confirm/remind patient of their appointment at [facility] on 11/27/23.   Appointment:   [x] Confirmed  [] Left mess   [] No answer/No voice mail  [] VM Full/unable to leave message  [] Phone not in service  Patient reminded to bring all medications and/or complete list.  Confirmed patient has transportation. Gave directions, instructed to utilize valet parking.

## 2023-11-27 ENCOUNTER — Telehealth: Payer: Self-pay | Admitting: Family

## 2023-11-27 ENCOUNTER — Ambulatory Visit: Admitting: Family

## 2023-11-27 NOTE — Telephone Encounter (Signed)
 Patient did not show for [facility] appointment on 02/06/23.

## 2023-11-30 ENCOUNTER — Encounter: Attending: Cardiovascular Disease

## 2023-11-30 VITALS — Ht 68.1 in | Wt 254.9 lb

## 2023-11-30 DIAGNOSIS — Z5189 Encounter for other specified aftercare: Secondary | ICD-10-CM | POA: Insufficient documentation

## 2023-11-30 DIAGNOSIS — I214 Non-ST elevation (NSTEMI) myocardial infarction: Secondary | ICD-10-CM

## 2023-11-30 DIAGNOSIS — I252 Old myocardial infarction: Secondary | ICD-10-CM | POA: Insufficient documentation

## 2023-11-30 NOTE — Progress Notes (Signed)
 Cardiac Individual Treatment Plan  Patient Details  Name: Terrance Hawkins MRN: [id number] Date of Birth: [birth year] Referring Provider:   Flowsheet Row Cardiac Rehab from 11/30/2023 in [facility] Cardiac and Pulmonary Rehab  Referring Provider Dr. Virgene Cage    Initial Encounter Date:  Flowsheet Row Cardiac Rehab from 11/30/2023 in [facility] Cardiac and Pulmonary Rehab  Date 11/30/23    Visit Diagnosis: NSTEMI (non-ST elevated myocardial infarction) [facility])  Patient's Home Medications on Admission:  Current Outpatient Medications:    aspirin  EC 81 MG tablet, Take 1 tablet (81 mg total) by mouth daily. Swallow whole., Disp: 30 tablet, Rfl: 12   atorvastatin  (LIPITOR) 40 MG tablet, Take 1 tablet (40 mg total) by mouth daily., Disp: 30 tablet, Rfl: 5   carvedilol  (COREG ) 6.25 MG tablet, Take 1 tablet (6.25 mg total) by mouth 2 (two) times daily., Disp: 60 tablet, Rfl: 5   clopidogrel  (PLAVIX ) 75 MG tablet, Take 1 tablet (75 mg total) by mouth daily with breakfast., Disp: 30 tablet, Rfl: 5   empagliflozin  (JARDIANCE ) 10 MG TABS tablet, Take 1 tablet (10 mg total) by mouth daily., Disp: 30 tablet, Rfl: 5   furosemide  (LASIX ) 40 MG tablet, Take 1 tablet (40 mg total) by mouth daily., Disp: 30 tablet, Rfl: 5   sacubitril -valsartan  (ENTRESTO ) 49-51 MG, Take 1 tablet by mouth 2 (two) times daily., Disp: 60 tablet, Rfl: 5   spironolactone  (ALDACTONE ) 25 MG tablet, Take 1 tablet (25 mg total) by mouth daily., Disp: 30 tablet, Rfl: 5   Venlafaxine HCl 37.5 MG TB24, Take 37.5 mg by mouth daily at 12 noon., Disp: , Rfl:   Past Medical History: Past Medical History:  Diagnosis Date   CHF (congestive heart failure) (HCC)    Hypertension     Tobacco Use: Social History   Tobacco Use  Smoking Status Every Day   Current packs/day: 0.50   Types: Cigarettes  Smokeless Tobacco Never    Labs: Review Flowsheet  More data exists      Latest Ref Rng & Units 09/08/2014 03/19/2017 10/04/2023 10/06/2023  10/07/2023  Labs for ITP Cardiac and Pulmonary Rehab  Cholestrol 0 - 200 mg/dL 849  - - - 844   LDL (calc) 0 - 99 mg/dL 95  - - - 97   HDL-C >59 mg/dL 29  - - - 35   Trlycerides <150 mg/dL 867  - - - 883   Hemoglobin A1c 4.8 - 5.6 % 5.3  5.3  - - -  PH, Arterial 7.35 - 7.45 - - 7.49  7.470  -  PCO2 arterial 32 - 48 mmHg - - 32  30.7  -  Bicarbonate 20.0 - 28.0 mmol/L - - 24.4  24.1  22.4  -  TCO2 22 - 32 mmol/L - - - 25  23  -  O2 Saturation % - - 90.3  61  88  -    Details       Multiple values from one day are sorted in reverse-chronological order          Exercise Target Goals: Exercise Program Goal: Individual exercise prescription set using results from initial 6 min walk test and THRR while considering  patient's activity barriers and safety.   Exercise Prescription Goal: Initial exercise prescription builds to 30-45 minutes a day of aerobic activity, 2-3 days per week.  Home exercise guidelines will be given to patient during program as part of exercise prescription that the participant will acknowledge.   Education: Aerobic Exercise: -  Group verbal and visual presentation on the components of exercise prescription. Introduces F.I.T.T principle from ACSM for exercise prescriptions.  Reviews F.I.T.T. principles of aerobic exercise including progression. Written material provided at class time.   Education: Resistance Exercise: - Group verbal and visual presentation on the components of exercise prescription. Introduces F.I.T.T principle from ACSM for exercise prescriptions  Reviews F.I.T.T. principles of resistance exercise including progression. Written material provided at class time.    Education: Exercise & Equipment Safety: - Individual verbal instruction and demonstration of equipment use and safety with use of the equipment. Flowsheet Row Cardiac Rehab from 11/30/2023 in [facility] Cardiac and Pulmonary Rehab  Date 11/30/23  Educator [facility]  Instruction Review Code 1-  Verbalizes Understanding    Education: Exercise Physiology & General Exercise Guidelines: - Group verbal and written instruction with models to review the exercise physiology of the cardiovascular system and associated critical values. Provides general exercise guidelines with specific guidelines to those with heart or lung disease. Written material provided at class time.   Education: Flexibility, Balance, Mind/Body Relaxation: - Group verbal and visual presentation with interactive activity on the components of exercise prescription. Introduces F.I.T.T principle from ACSM for exercise prescriptions. Reviews F.I.T.T. principles of flexibility and balance exercise training including progression. Also discusses the mind body connection.  Reviews various relaxation techniques to help reduce and manage stress (i.e. Deep breathing, progressive muscle relaxation, and visualization). Balance handout provided to take home. Written material provided at class time.   Activity Barriers & Risk Stratification:  Activity Barriers & Cardiac Risk Stratification - 11/30/23 1548       Activity Barriers & Cardiac Risk Stratification   Activity Barriers Joint Problems    Cardiac Risk Stratification High          6 Minute Walk:  6 Minute Walk     Row Name 11/30/23 1547         6 Minute Walk   Phase Initial     Distance 680 feet     Walk Time 4.33 minutes     # of Rest Breaks 2     MPH 1.79     METS 3.6     RPE 13     Perceived Dyspnea  2     VO2 Peak 12.7     Symptoms Yes (comment)     Comments bilater knee, and lower lag pain     Resting HR 76 bpm     Resting BP 132/86     Resting Oxygen Saturation  96 %     Exercise Oxygen Saturation  during 6 min walk 98 %     Max Ex. HR 121 bpm     Max Ex. BP 164/88     2 Minute Post BP 144/88        Oxygen Initial Assessment:   Oxygen Re-Evaluation:   Oxygen Discharge (Final Oxygen Re-Evaluation):   Initial Exercise Prescription:   Initial Exercise Prescription - 11/30/23 1500       Date of Initial Exercise RX and Referring Provider   Date 11/30/23    Referring Provider Dr. Virgene Cage      Oxygen   Oxygen --    Maintain Oxygen Saturation 88% or higher      Recumbant Bike   Level 3    RPM 50    Watts 25    Minutes 15    METs 3.6      Arm Ergometer   Level 2    Watts 25  RPM 25    Minutes 15    METs 3.6      T5 Nustep   Level 3    SPM 80    Minutes 15    METs 3.6      Prescription Details   Duration Progress to 30 minutes of continuous aerobic without signs/symptoms of physical distress      Intensity   THRR 40-80% of Max Heartrate 120-164    Ratings of Perceived Exertion 11-13    Perceived Dyspnea 0-4      Progression   Progression Continue to progress workloads to maintain intensity without signs/symptoms of physical distress.      Resistance Training   Training Prescription Yes    Weight 10lb    Reps 10-15          Perform Capillary Blood Glucose checks as needed.  Exercise Prescription Changes:   Exercise Prescription Changes     Row Name 11/30/23 1500             Response to Exercise   Blood Pressure (Admit) 132/86       Blood Pressure (Exercise) 164/88       Blood Pressure (Exit) 144/88       Heart Rate (Admit) 76 bpm       Heart Rate (Exercise) 121 bpm       Heart Rate (Exit) 80 bpm       Oxygen Saturation (Admit) 96 %       Oxygen Saturation (Exercise) 98 %       Oxygen Saturation (Exit) 98 %       Rating of Perceived Exertion (Exercise) 13       Perceived Dyspnea (Exercise) 2       Symptoms bilteral knee, and lower leg pain       Comments results          Exercise Comments:   Exercise Goals and Review:   Exercise Goals     Row Name 11/30/23 1558             Exercise Goals   Increase Physical Activity Yes       Intervention Provide advice, education, support and counseling about physical activity/exercise needs.;Develop an  individualized exercise prescription for aerobic and resistive training based on initial evaluation findings, risk stratification, comorbidities and participant's personal goals.       Expected Outcomes Short Term: Attend rehab on a regular basis to increase amount of physical activity.;Long Term: Add in home exercise to make exercise part of routine and to increase amount of physical activity.;Long Term: Exercising regularly at least 3-5 days a week.       Increase Strength and Stamina Yes       Intervention Provide advice, education, support and counseling about physical activity/exercise needs.;Develop an individualized exercise prescription for aerobic and resistive training based on initial evaluation findings, risk stratification, comorbidities and participant's personal goals.       Expected Outcomes Short Term: Increase workloads from initial exercise prescription for resistance, speed, and METs.;Short Term: Perform resistance training exercises routinely during rehab and add in resistance training at home;Long Term: Improve cardiorespiratory fitness, muscular endurance and strength as measured by increased METs and functional capacity ( )       Able to understand and use rate of perceived exertion (RPE) scale Yes       Intervention Provide education and explanation on how to use RPE scale       Expected Outcomes Short Term: Able  to use RPE daily in rehab to express subjective intensity level;Long Term:  Able to use RPE to guide intensity level when exercising independently       Able to understand and use Dyspnea scale Yes       Intervention Provide education and explanation on how to use Dyspnea scale       Expected Outcomes Short Term: Able to use Dyspnea scale daily in rehab to express subjective sense of shortness of breath during exertion;Long Term: Able to use Dyspnea scale to guide intensity level when exercising independently       Knowledge and understanding of Target Heart Rate Range  (THRR) Yes       Intervention Provide education and explanation of THRR including how the numbers were predicted and where they are located for reference       Expected Outcomes Short Term: Able to state/look up THRR;Short Term: Able to use daily as guideline for intensity in rehab;Long Term: Able to use THRR to govern intensity when exercising independently       Able to check pulse independently Yes       Intervention Provide education and demonstration on how to check pulse in carotid and radial arteries.;Review the importance of being able to check your own pulse for safety during independent exercise       Expected Outcomes Short Term: Able to explain why pulse checking is important during independent exercise;Long Term: Able to check pulse independently and accurately       Understanding of Exercise Prescription Yes       Intervention Provide education, explanation, and written materials on patient's individual exercise prescription       Expected Outcomes Short Term: Able to explain program exercise prescription;Long Term: Able to explain home exercise prescription to exercise independently          Exercise Goals Re-Evaluation :   Discharge Exercise Prescription (Final Exercise Prescription Changes):  Exercise Prescription Changes - 11/30/23 1500       Response to Exercise   Blood Pressure (Admit) 132/86    Blood Pressure (Exercise) 164/88    Blood Pressure (Exit) 144/88    Heart Rate (Admit) 76 bpm    Heart Rate (Exercise) 121 bpm    Heart Rate (Exit) 80 bpm    Oxygen Saturation (Admit) 96 %    Oxygen Saturation (Exercise) 98 %    Oxygen Saturation (Exit) 98 %    Rating of Perceived Exertion (Exercise) 13    Perceived Dyspnea (Exercise) 2    Symptoms bilteral knee, and lower leg pain    Comments results          Nutrition:  Target Goals: Understanding of nutrition guidelines, daily intake of sodium 1500mg , cholesterol 200mg , calories 30% from fat and 7% or less  from saturated fats, daily to have 5 or more servings of fruits and vegetables.  Education: Nutrition 1 -Group instruction provided by verbal, written material, interactive activities, discussions, models, and posters to present general guidelines for heart healthy nutrition including macronutrients, label reading, and promoting whole foods over processed counterparts. Education serves as pensions consultant of discussion of heart healthy eating for all. Written material provided at class time.    Education: Nutrition 2 -Group instruction provided by verbal, written material, interactive activities, discussions, models, and posters to present general guidelines for heart healthy nutrition including sodium, cholesterol, and saturated fat. Providing guidance of habit forming to improve blood pressure, cholesterol, and body weight. Written material provided at class time.  Biometrics:  Pre Biometrics - 11/30/23 1558       Pre Biometrics   Height 5' 8.1 (1.73 m)    Weight 254 lb 14.4 oz (115.6 kg)    Waist Circumference 46 inches    Hip Circumference 40 inches    Waist to Hip Ratio 1.15 %    BMI (Calculated) 38.63    Single Leg Stand 30 seconds           Nutrition Therapy Plan and Nutrition Goals:   Nutrition Assessments:  MEDIFICTS Score Key: >=70 Need to make dietary changes  40-70 Heart Healthy Diet <= 40 Therapeutic Level Cholesterol Diet   Picture Your Plate Scores: <59 Unhealthy dietary pattern with much room for improvement. 41-50 Dietary pattern unlikely to meet recommendations for good health and room for improvement. 51-60 More healthful dietary pattern, with some room for improvement.  >60 Healthy dietary pattern, although there may be some specific behaviors that could be improved.    Nutrition Goals Re-Evaluation:   Nutrition Goals Discharge (Final Nutrition Goals Re-Evaluation):   Psychosocial: Target Goals: Acknowledge presence or absence of significant  depression and/or stress, maximize coping skills, provide positive support system. Participant is able to verbalize types and ability to use techniques and skills needed for reducing stress and depression.   Education: Stress, Anxiety, and Depression - Group verbal and visual presentation to define topics covered.  Reviews how body is impacted by stress, anxiety, and depression.  Also discusses healthy ways to reduce stress and to treat/manage anxiety and depression. Written material provided at class time.   Education: Sleep Hygiene -Provides group verbal and written instruction about how sleep can affect your health.  Define sleep hygiene, discuss sleep cycles and impact of sleep habits. Review good sleep hygiene tips.   Initial Review & Psychosocial Screening:  Initial Psych Review & Screening - 11/23/23 1356       Initial Review   Current issues with Current Depression;Current Anxiety/Panic;Current Psychotropic Meds      Family Dynamics   Good Support System? Yes   patient feels supported but did not give specific individuals   Comments Deontra states he is excited to try cardiac rehab. He was hesitant regarding ability to come 2-3 times per week but states he will try. Kristopher reports that he is currently taking medication for anxiety and depression and that he enjoys working out when he feels overwhelmed and stressed out. Dontavion reports that he currenlty smokes tobacco and that he uses marijuana and cocain occasionally, however states he is trying to limit his cocaine use due to recent heart event. He does report bilateral knee pain, but states it usually does not limit his quality of life.      Barriers   Psychosocial barriers to participate in program The patient should benefit from training in stress management and relaxation.      Screening Interventions   Interventions Encouraged to exercise;To provide support and resources with identified psychosocial needs;Provide  feedback about the scores to participant    Expected Outcomes Short Term goal: Utilizing psychosocial counselor, staff and physician to assist with identification of specific Stressors or current issues interfering with healing process. Setting desired goal for each stressor or current issue identified.;Long Term Goal: Stressors or current issues are controlled or eliminated.;Short Term goal: Identification and review with participant of any Quality of Life or Depression concerns found by scoring the questionnaire.;Long Term goal: The participant improves quality of Life and PHQ9 Scores as seen by post scores and/or  verbalization of changes          Quality of Life Scores:   Scores of 19 and below usually indicate a poorer quality of life in these areas.  A difference of  2-3 points is a clinically meaningful difference.  A difference of 2-3 points in the total score of the Quality of Life Index has been associated with significant improvement in overall quality of life, self-image, physical symptoms, and general health in studies assessing change in quality of life.  PHQ-9: Review Flowsheet       11/30/2023 09/07/2017 09/26/2014  Depression screen PHQ 2/9  Decreased Interest 3 0 3  Down, Depressed, Hopeless 3 1 3   PHQ - 2 Score 6 1 6   Altered sleeping 3 - 2  Tired, decreased energy 3 - 0  Change in appetite 0 - 1  Feeling bad or failure about yourself  3 - 3  Trouble concentrating 3 - 0  Moving slowly or fidgety/restless 3 - 0  Suicidal thoughts 0 - 0   PHQ-9 Score 21 - 12   Difficult doing work/chores Extremely dIfficult - Not difficult at all    Details       Data saved with a previous flowsheet row definition        Interpretation of Total Score  Total Score Depression Severity:  1-4 = Minimal depression, 5-9 = Mild depression, 10-14 = Moderate depression, 15-19 = Moderately severe depression, 20-27 = Severe depression   Psychosocial Evaluation and Intervention:   Psychosocial Evaluation - 11/23/23 1401       Psychosocial Evaluation & Interventions   Interventions Encouraged to exercise with the program and follow exercise prescription;Stress management education;Relaxation education    Comments Adyan states he is excited to try cardiac rehab. He was hesitant regarding ability to come 2-3 times per week but states he will try. Elman reports that he is currently taking medication for anxiety and depression and that he enjoys working out when he feels overwhelmed and stressed out. Aemon reports that he currenlty smokes tobacco and that he uses marijuana and cocain occasionally, however states he is trying to limit his cocaine use due to recent heart event. He does report bilateral knee pain, but states it usually does not limit his quality of life.    Expected Outcomes Short: Attend cardiac rehab for education and exercise. Long: Develop and maintain positive self care habits.    Continue Psychosocial Services  Follow up required by staff          Psychosocial Re-Evaluation:   Psychosocial Discharge (Final Psychosocial Re-Evaluation):   Vocational Rehabilitation: Provide vocational rehab assistance to qualifying candidates.   Vocational Rehab Evaluation & Intervention:  Vocational Rehab - 11/23/23 1356       Initial Vocational Rehab Evaluation & Intervention   Assessment shows need for Vocational Rehabilitation No          Education: Education Goals: Education classes will be provided on a variety of topics geared toward better understanding of heart health and risk factor modification. Participant will state understanding/return demonstration of topics presented as noted by education test scores.  Learning Barriers/Preferences:  Learning Barriers/Preferences - 11/23/23 1356       Learning Barriers/Preferences   Learning Barriers None    Learning Preferences None          General Cardiac Education  Topics:  AED/CPR: - Group verbal and written instruction with the use of models to demonstrate the basic use of the AED with the basic  ABC's of resuscitation.   Test and Procedures: - Group verbal and visual presentation and models provide information about basic cardiac anatomy and function. Reviews the testing methods done to diagnose heart disease and the outcomes of the test results. Describes the treatment choices: Medical Management, Angioplasty, or Coronary Bypass Surgery for treating various heart conditions including Myocardial Infarction, Angina, Valve Disease, and Cardiac Arrhythmias. Written material provided at class time.   Medication Safety: - Group verbal and visual instruction to review commonly prescribed medications for heart and lung disease. Reviews the medication, class of the drug, and side effects. Includes the steps to properly store meds and maintain the prescription regimen. Written material provided at class time.   Intimacy: - Group verbal instruction through game format to discuss how heart and lung disease can affect sexual intimacy. Written material provided at class time.   Know Your Numbers and Heart Failure: - Group verbal and visual instruction to discuss disease risk factors for cardiac and pulmonary disease and treatment options.  Reviews associated critical values for Overweight/Obesity, Hypertension, Cholesterol, and Diabetes.  Discusses basics of heart failure: signs/symptoms and treatments.  Introduces Heart Failure Zone chart for action plan for heart failure. Written material provided at class time.   Infection Prevention: - Provides verbal and written material to individual with discussion of infection control including proper hand washing and proper equipment cleaning during exercise session. Flowsheet Row Cardiac Rehab from 11/30/2023 in [facility] Cardiac and Pulmonary Rehab  Date 11/30/23  Educator [facility]  Instruction Review Code 1- Verbalizes  Understanding    Falls Prevention: - Provides verbal and written material to individual with discussion of falls prevention and safety. Flowsheet Row Cardiac Rehab from 11/30/2023 in [facility] Cardiac and Pulmonary Rehab  Date 11/30/23  Educator [facility]  Instruction Review Code 1- Verbalizes Understanding    Other: -Provides group and verbal instruction on various topics (see comments)   Knowledge Questionnaire Score:   Core Components/Risk Factors/Patient Goals at Admission:  Personal Goals and Risk Factors at Admission - 11/23/23 1354       Core Components/Risk Factors/Patient Goals on Admission    Weight Management Yes;Weight Loss    Intervention Weight Management: Develop a combined nutrition and exercise program designed to reach desired caloric intake, while maintaining appropriate intake of nutrient and fiber, sodium and fats, and appropriate energy expenditure required for the weight goal.;Weight Management: Provide education and appropriate resources to help participant work on and attain dietary goals.;Weight Management/Obesity: Establish reasonable short term and long term weight goals.;Obesity: Provide education and appropriate resources to help participant work on and attain dietary goals.    Goal Weight: Short Term 225 lb (102.1 kg)    Goal Weight: Long Term 200 lb (90.7 kg)    Expected Outcomes Short Term: Continue to assess and modify interventions until short term weight is achieved;Long Term: Adherence to nutrition and physical activity/exercise program aimed toward attainment of established weight goal;Weight Loss: Understanding of general recommendations for a balanced deficit meal plan, which promotes 1-2 lb weight loss per week and includes a negative energy balance of [phone] kcal/d;Understanding recommendations for meals to include 15-35% energy as protein, 25-35% energy from fat, 35-60% energy from carbohydrates, less than 200mg  of dietary cholesterol, 20-35 gm of total  fiber daily;Understanding of distribution of calorie intake throughout the day with the consumption of 4-5 meals/snacks    Tobacco Cessation Yes    Number of packs per day 0.5    Intervention Assist the participant in steps to quit. Provide  individualized education and counseling about committing to Tobacco Cessation, relapse prevention, and pharmacological support that can be provided by physician.;Education officer, environmental, assist with locating and accessing local/national Quit Smoking programs, and support quit date choice.    Expected Outcomes Short Term: Will demonstrate readiness to quit, by selecting a quit date.;Long Term: Complete abstinence from all tobacco products for at least 12 months from quit date.;Short Term: Will quit all tobacco product use, adhering to prevention of relapse plan.    Heart Failure Yes    Intervention Provide a combined exercise and nutrition program that is supplemented with education, support and counseling about heart failure. Directed toward relieving symptoms such as shortness of breath, decreased exercise tolerance, and extremity edema.    Expected Outcomes Improve functional capacity of life;Short term: Attendance in program 2-3 days a week with increased exercise capacity. Reported lower sodium intake. Reported increased fruit and vegetable intake. Reports medication compliance.;Short term: Daily weights obtained and reported for increase. Utilizing diuretic protocols set by physician.;Long term: Adoption of self-care skills and reduction of barriers for early signs and symptoms recognition and intervention leading to self-care maintenance.    Hypertension Yes    Intervention Provide education on lifestyle modifcations including regular physical activity/exercise, weight management, moderate sodium restriction and increased consumption of fresh fruit, vegetables, and low fat dairy, alcohol moderation, and smoking cessation.;Monitor prescription use compliance.     Expected Outcomes Short Term: Continued assessment and intervention until BP is < 140/3mm HG in hypertensive participants. < 130/33mm HG in hypertensive participants with diabetes, heart failure or chronic kidney disease.;Long Term: Maintenance of blood pressure at goal levels.          Education:Diabetes - Individual verbal and written instruction to review signs/symptoms of diabetes, desired ranges of glucose level fasting, after meals and with exercise. Acknowledge that pre and post exercise glucose checks will be done for 3 sessions at entry of program.   Core Components/Risk Factors/Patient Goals Review:    Core Components/Risk Factors/Patient Goals at Discharge (Final Review):    ITP Comments:  ITP Comments     Row Name 11/23/23 1345 11/30/23 1546         ITP Comments Initial phone call completed. Diagnosis can be found in [facility] 10/11/23. EP Orientation scheduled for 11/30/23 at 14:00.  Le is a current tobacco user. Intervention for tobacco cessation was provided at the initial medical review. He was asked about readiness to quit and reported he has decreased use but is not ready to quit completely . Patient was advised and educated about tobacco cessation using combination therapy, tobacco cessation classes, quit line, and quit smoking apps. Patient demonstrated understanding of this material. Staff will continue to provide encouragement and follow up with the patient throughout the program. Completed and gym orientation for cardiac rehab. Initial ITP created and sent for review to Dr. Oneil Pinal, Medical Director.         Comments: Initial ITP

## 2023-11-30 NOTE — Patient Instructions (Signed)
 Patient Instructions  Patient Details  Name: Terrance Hawkins MRN: [id number] Date of Birth: [birth year] Referring Provider:  [facility] [employer]  Below are your personal goals for exercise, nutrition, and risk factors. Our goal is to help you stay on track towards obtaining and maintaining these goals. We will be discussing your progress on these goals with you throughout the program.  Initial Exercise Prescription:  Initial Exercise Prescription - 11/30/23 1500       Date of Initial Exercise RX and Referring Provider   Date 11/30/23    Referring Provider Dr. Virgene Cage      Oxygen   Oxygen --    Maintain Oxygen Saturation 88% or higher      Recumbant Bike   Level 3    RPM 50    Watts 25    Minutes 15    METs 3.6      Arm Ergometer   Level 2    Watts 25    RPM 25    Minutes 15    METs 3.6      T5 Nustep   Level 3    SPM 80    Minutes 15    METs 3.6      Prescription Details   Duration Progress to 30 minutes of continuous aerobic without signs/symptoms of physical distress      Intensity   THRR 40-80% of Max Heartrate 120-164    Ratings of Perceived Exertion 11-13    Perceived Dyspnea 0-4      Progression   Progression Continue to progress workloads to maintain intensity without signs/symptoms of physical distress.      Resistance Training   Training Prescription Yes    Weight 10lb    Reps 10-15          Exercise Goals: Frequency: Be able to perform aerobic exercise two to three times per week in program working toward 2-5 days per week of home exercise.  Intensity: Work with a perceived exertion of 11 (fairly light) - 15 (hard) while following your exercise prescription.  We will make changes to your prescription with you as you progress through the program.   Duration: Be able to do 30 to 45 minutes of continuous aerobic exercise in addition to a 5 minute warm-up and a 5 minute cool-down routine.   Nutrition Goals: Your personal  nutrition goals will be established when you do your nutrition analysis with the dietician.  The following are general nutrition guidelines to follow: Cholesterol < 200mg /day Sodium < 1500mg /day Fiber: Men under 50 yrs - 38 grams per day  Personal Goals:  Personal Goals and Risk Factors at Admission - 11/23/23 1354       Core Components/Risk Factors/Patient Goals on Admission    Weight Management Yes;Weight Loss    Intervention Weight Management: Develop a combined nutrition and exercise program designed to reach desired caloric intake, while maintaining appropriate intake of nutrient and fiber, sodium and fats, and appropriate energy expenditure required for the weight goal.;Weight Management: Provide education and appropriate resources to help participant work on and attain dietary goals.;Weight Management/Obesity: Establish reasonable short term and long term weight goals.;Obesity: Provide education and appropriate resources to help participant work on and attain dietary goals.    Goal Weight: Short Term 225 lb (102.1 kg)    Goal Weight: Long Term 200 lb (90.7 kg)    Expected Outcomes Short Term: Continue to assess and modify interventions until short term weight is achieved;Long Term: Adherence to  nutrition and physical activity/exercise program aimed toward attainment of established weight goal;Weight Loss: Understanding of general recommendations for a balanced deficit meal plan, which promotes 1-2 lb weight loss per week and includes a negative energy balance of [id number] kcal/d;Understanding recommendations for meals to include 15-35% energy as protein, 25-35% energy from fat, 35-60% energy from carbohydrates, less than 200mg  of dietary cholesterol, 20-35 gm of total fiber daily;Understanding of distribution of calorie intake throughout the day with the consumption of 4-5 meals/snacks    Tobacco Cessation Yes    Number of packs per day 0.5    Intervention Assist the participant in steps to  quit. Provide individualized education and counseling about committing to Tobacco Cessation, relapse prevention, and pharmacological support that can be provided by physician.;Education officer, environmental, assist with locating and accessing local/national Quit Smoking programs, and support quit date choice.    Expected Outcomes Short Term: Will demonstrate readiness to quit, by selecting a quit date.;Long Term: Complete abstinence from all tobacco products for at least 12 months from quit date.;Short Term: Will quit all tobacco product use, adhering to prevention of relapse plan.    Heart Failure Yes    Intervention Provide a combined exercise and nutrition program that is supplemented with education, support and counseling about heart failure. Directed toward relieving symptoms such as shortness of breath, decreased exercise tolerance, and extremity edema.    Expected Outcomes Improve functional capacity of life;Short term: Attendance in program 2-3 days a week with increased exercise capacity. Reported lower sodium intake. Reported increased fruit and vegetable intake. Reports medication compliance.;Short term: Daily weights obtained and reported for increase. Utilizing diuretic protocols set by physician.;Long term: Adoption of self-care skills and reduction of barriers for early signs and symptoms recognition and intervention leading to self-care maintenance.    Hypertension Yes    Intervention Provide education on lifestyle modifcations including regular physical activity/exercise, weight management, moderate sodium restriction and increased consumption of fresh fruit, vegetables, and low fat dairy, alcohol moderation, and smoking cessation.;Monitor prescription use compliance.    Expected Outcomes Short Term: Continued assessment and intervention until BP is < 140/40mm HG in hypertensive participants. < 130/78mm HG in hypertensive participants with diabetes, heart failure or chronic kidney  disease.;Long Term: Maintenance of blood pressure at goal levels.          Tobacco Use Initial Evaluation: Social History   Tobacco Use  Smoking Status Every Day   Current packs/day: 0.50   Types: Cigarettes  Smokeless Tobacco Never    Exercise Goals and Review:  Exercise Goals     Row Name 11/30/23 1558             Exercise Goals   Increase Physical Activity Yes       Intervention Provide advice, education, support and counseling about physical activity/exercise needs.;Develop an individualized exercise prescription for aerobic and resistive training based on initial evaluation findings, risk stratification, comorbidities and participant's personal goals.       Expected Outcomes Short Term: Attend rehab on a regular basis to increase amount of physical activity.;Long Term: Add in home exercise to make exercise part of routine and to increase amount of physical activity.;Long Term: Exercising regularly at least 3-5 days a week.       Increase Strength and Stamina Yes       Intervention Provide advice, education, support and counseling about physical activity/exercise needs.;Develop an individualized exercise prescription for aerobic and resistive training based on initial evaluation findings, risk stratification, comorbidities and participant's personal  goals.       Expected Outcomes Short Term: Increase workloads from initial exercise prescription for resistance, speed, and METs.;Short Term: Perform resistance training exercises routinely during rehab and add in resistance training at home;Long Term: Improve cardiorespiratory fitness, muscular endurance and strength as measured by increased METs and functional capacity ( )       Able to understand and use rate of perceived exertion (RPE) scale Yes       Intervention Provide education and explanation on how to use RPE scale       Expected Outcomes Short Term: Able to use RPE daily in rehab to express subjective intensity  level;Long Term:  Able to use RPE to guide intensity level when exercising independently       Able to understand and use Dyspnea scale Yes       Intervention Provide education and explanation on how to use Dyspnea scale       Expected Outcomes Short Term: Able to use Dyspnea scale daily in rehab to express subjective sense of shortness of breath during exertion;Long Term: Able to use Dyspnea scale to guide intensity level when exercising independently       Knowledge and understanding of Target Heart Rate Range (THRR) Yes       Intervention Provide education and explanation of THRR including how the numbers were predicted and where they are located for reference       Expected Outcomes Short Term: Able to state/look up THRR;Short Term: Able to use daily as guideline for intensity in rehab;Long Term: Able to use THRR to govern intensity when exercising independently       Able to check pulse independently Yes       Intervention Provide education and demonstration on how to check pulse in carotid and radial arteries.;Review the importance of being able to check your own pulse for safety during independent exercise       Expected Outcomes Short Term: Able to explain why pulse checking is important during independent exercise;Long Term: Able to check pulse independently and accurately       Understanding of Exercise Prescription Yes       Intervention Provide education, explanation, and written materials on patient's individual exercise prescription       Expected Outcomes Short Term: Able to explain program exercise prescription;Long Term: Able to explain home exercise prescription to exercise independently          Copy of goals given to participant.

## 2023-12-06 ENCOUNTER — Encounter

## 2023-12-06 DIAGNOSIS — Z5189 Encounter for other specified aftercare: Secondary | ICD-10-CM | POA: Diagnosis not present

## 2023-12-06 DIAGNOSIS — I214 Non-ST elevation (NSTEMI) myocardial infarction: Secondary | ICD-10-CM

## 2023-12-06 NOTE — Progress Notes (Signed)
 Daily Session Note  Patient Details  Name: Terrance Hawkins MRN: [id number] Date of Birth: 1990-12-07 Referring Provider:   Flowsheet Row Cardiac Rehab from 11/30/2023 in [facility] Cardiac and Pulmonary Rehab  Referring Provider Dr. Virgene Cage    Encounter Date: 12/06/2023  Check In:  Session Check In - 12/06/23 1413       Check-In   Supervising physician immediately available to respond to emergencies See telemetry face sheet for immediately available ER MD    Location [facility]-Cardiac & Pulmonary Rehab    Staff Present Burnard Davenport RN,BSN,MPA;Laura Cates RN,BSN;Aariz Maish Dyane BS, ACSM CEP, Exercise Physiologist;Noah Tickle, BS, Exercise Physiologist;Margaret Best, MS, Exercise Physiologist    Virtual Visit No    Medication changes reported     No    Fall or balance concerns reported    No    Warm-up and Cool-down Performed on first and last piece of equipment    Resistance Training Performed Yes    VAD Patient? No    PAD/SET Patient? No      Pain Assessment   Currently in Pain? No/denies             Social History   Tobacco Use  Smoking Status Every Day   Current packs/day: 0.50   Types: Cigarettes  Smokeless Tobacco Never    Goals Met:  Independence with exercise equipment Exercise tolerated well No report of concerns or symptoms today Strength training completed today  Goals Unmet:  Not Applicable  Comments: First full day of exercise!  Patient was oriented to gym and equipment including functions, settings, policies, and procedures.  Patient's individual exercise prescription and treatment plan were reviewed.  All starting workloads were established based on the results of the 6 minute walk test done at initial orientation visit.  The plan for exercise progression was also introduced and progression will be customized based on patient's performance and goals.    Dr. Oneil Pinal is Medical Director for [facility] Cardiac Rehabilitation.  Dr. Fuad Aleskerov  is Medical Director for [facility] Pulmonary Rehabilitation.

## 2023-12-11 ENCOUNTER — Encounter: Admitting: Emergency Medicine

## 2023-12-11 DIAGNOSIS — Z5189 Encounter for other specified aftercare: Secondary | ICD-10-CM | POA: Diagnosis not present

## 2023-12-11 DIAGNOSIS — I214 Non-ST elevation (NSTEMI) myocardial infarction: Secondary | ICD-10-CM

## 2023-12-11 NOTE — Progress Notes (Signed)
 Daily Session Note  Patient Details  Name: Terrance Hawkins MRN: [id number] Date of Birth: Feb 17, 1990 Referring Provider:   Flowsheet Row Cardiac Rehab from 11/30/2023 in [facility] Cardiac and Pulmonary Rehab  Referring Provider Dr. Virgene Cage    Encounter Date: 12/11/2023  Check In:  Session Check In - 12/11/23 1428       Check-In   Supervising physician immediately available to respond to emergencies See telemetry face sheet for immediately available ER MD    Location [facility]-Cardiac & Pulmonary Rehab    Staff Present Leita Franks RN,BSN;Meredith Tressa RN,BSN;Margaret Best, MS, Exercise Physiologist;Noah Tickle, BS, Exercise Physiologist    Virtual Visit No    Medication changes reported     No    Fall or balance concerns reported    No    Warm-up and Cool-down Performed on first and last piece of equipment    Resistance Training Performed Yes    VAD Patient? No    PAD/SET Patient? No      Pain Assessment   Currently in Pain? No/denies             Social History   Tobacco Use  Smoking Status Every Day   Current packs/day: 0.50   Types: Cigarettes  Smokeless Tobacco Never    Goals Met:  Independence with exercise equipment Exercise tolerated well No report of concerns or symptoms today Strength training completed today  Goals Unmet:  Not Applicable  Comments: Pt able to follow exercise prescription today without complaint.  Will continue to monitor for progression.    Dr. Oneil Pinal is Medical Director for [facility] Cardiac Rehabilitation.  Dr. Fuad Aleskerov is Medical Director for [facility] Pulmonary Rehabilitation.

## 2023-12-13 ENCOUNTER — Encounter: Admitting: Emergency Medicine

## 2023-12-13 DIAGNOSIS — Z5189 Encounter for other specified aftercare: Secondary | ICD-10-CM | POA: Diagnosis not present

## 2023-12-13 DIAGNOSIS — I214 Non-ST elevation (NSTEMI) myocardial infarction: Secondary | ICD-10-CM

## 2023-12-13 NOTE — Progress Notes (Signed)
 Daily Session Note  Patient Details  Name: Terrance Hawkins MRN: [id number] Date of Birth: 06-25-1990 Referring Provider:   Flowsheet Row Cardiac Rehab from 11/30/2023 in [facility] Cardiac and Pulmonary Rehab  Referring Provider Dr. Virgene Cage    Encounter Date: 12/13/2023  Check In:  Session Check In - 12/13/23 1416       Check-In   Supervising physician immediately available to respond to emergencies See telemetry face sheet for immediately available ER MD    Location [facility]-Cardiac & Pulmonary Rehab    Staff Present Leita Franks RN,BSN;Joseph Rolinda RCP,RRT,BSRT;Kelly Bollinger RN,BSN,MPA;Kelly Dyane BS, ACSM CEP, Exercise Physiologist    Virtual Visit No    Medication changes reported     No    Fall or balance concerns reported    No    Warm-up and Cool-down Performed on first and last piece of equipment    Resistance Training Performed Yes    VAD Patient? No    PAD/SET Patient? No      Pain Assessment   Currently in Pain? No/denies             Social History   Tobacco Use  Smoking Status Every Day   Current packs/day: 0.50   Types: Cigarettes  Smokeless Tobacco Never    Goals Met:  Independence with exercise equipment Exercise tolerated well No report of concerns or symptoms today Strength training completed today  Goals Unmet:  Not Applicable  Comments: Pt able to follow exercise prescription today without complaint.  Will continue to monitor for progression.    Dr. Oneil Pinal is Medical Director for [facility] Cardiac Rehabilitation.  Dr. Fuad Aleskerov is Medical Director for [facility] Pulmonary Rehabilitation.

## 2023-12-18 ENCOUNTER — Encounter: Admitting: Emergency Medicine

## 2023-12-18 DIAGNOSIS — I214 Non-ST elevation (NSTEMI) myocardial infarction: Secondary | ICD-10-CM

## 2023-12-18 DIAGNOSIS — Z5189 Encounter for other specified aftercare: Secondary | ICD-10-CM | POA: Diagnosis not present

## 2023-12-18 NOTE — Progress Notes (Signed)
 Daily Session Note  Patient Details  Name: Terrance Hawkins MRN: [id number] Date of Birth: [birth year] Referring Provider:   Flowsheet Row Cardiac Rehab from 11/30/2023 in [facility] Cardiac and Pulmonary Rehab  Referring Provider Dr. Virgene Cage    Encounter Date: 12/18/2023  Check In:  Session Check In - 12/18/23 1354       Check-In   Supervising physician immediately available to respond to emergencies See telemetry face sheet for immediately available ER MD    Location [facility]-Cardiac & Pulmonary Rehab    Staff Present Leita Franks RN,BSN;Maxon Burnell BS, Exercise Physiologist;Margaret Best, MS, Exercise Physiologist;Noah Tickle, BS, Exercise Physiologist    Virtual Visit No    Medication changes reported     No    Fall or balance concerns reported    No    Warm-up and Cool-down Performed on first and last piece of equipment    Resistance Training Performed Yes    VAD Patient? No    PAD/SET Patient? No      Pain Assessment   Currently in Pain? No/denies             Social History   Tobacco Use  Smoking Status Every Day   Current packs/day: 0.50   Types: Cigarettes  Smokeless Tobacco Never    Goals Met:  Independence with exercise equipment Exercise tolerated well No report of concerns or symptoms today Strength training completed today  Goals Unmet:  Not Applicable  Comments: Pt able to follow exercise prescription today without complaint.  Will continue to monitor for progression.    Dr. Oneil Pinal is Medical Director for [facility] Cardiac Rehabilitation.  Dr. Fuad Aleskerov is Medical Director for [facility] Pulmonary Rehabilitation.

## 2023-12-20 ENCOUNTER — Encounter: Admitting: Emergency Medicine

## 2023-12-20 DIAGNOSIS — I214 Non-ST elevation (NSTEMI) myocardial infarction: Secondary | ICD-10-CM

## 2023-12-20 DIAGNOSIS — Z5189 Encounter for other specified aftercare: Secondary | ICD-10-CM | POA: Diagnosis not present

## 2023-12-20 NOTE — Progress Notes (Signed)
 Daily Session Note  Patient Details  Name: JAGGER DEMONTE MRN: [id number] Date of Birth: 1990-08-12 Referring Provider:   Flowsheet Row Cardiac Rehab from 11/30/2023 in [facility] Cardiac and Pulmonary Rehab  Referring Provider Dr. Virgene Cage    Encounter Date: 12/20/2023  Check In:  Session Check In - 12/20/23 1403       Check-In   Supervising physician immediately available to respond to emergencies See telemetry face sheet for immediately available ER MD    Location [facility]-Cardiac & Pulmonary Rehab    Staff Present Leita Franks RN,BSN;Noah Tickle, BS, Exercise Physiologist;Margaret Best, MS, Exercise Physiologist;Meredith Tressa RN,BSN    Virtual Visit No    Medication changes reported     No    Fall or balance concerns reported    No    Warm-up and Cool-down Performed on first and last piece of equipment    Resistance Training Performed Yes    VAD Patient? No    PAD/SET Patient? No      Pain Assessment   Currently in Pain? No/denies             Social History   Tobacco Use  Smoking Status Every Day   Current packs/day: 0.50   Types: Cigarettes  Smokeless Tobacco Never    Goals Met:  Independence with exercise equipment Exercise tolerated well No report of concerns or symptoms today Strength training completed today  Goals Unmet:  Not Applicable  Comments: Pt able to follow exercise prescription today without complaint.  Will continue to monitor for progression.    Dr. Oneil Pinal is Medical Director for [facility] Cardiac Rehabilitation.  Dr. Fuad Aleskerov is Medical Director for [facility] Pulmonary Rehabilitation.

## 2023-12-25 ENCOUNTER — Encounter: Attending: Cardiovascular Disease

## 2023-12-25 DIAGNOSIS — I214 Non-ST elevation (NSTEMI) myocardial infarction: Secondary | ICD-10-CM | POA: Insufficient documentation

## 2023-12-27 ENCOUNTER — Encounter

## 2023-12-27 DIAGNOSIS — I214 Non-ST elevation (NSTEMI) myocardial infarction: Secondary | ICD-10-CM

## 2023-12-27 NOTE — Progress Notes (Signed)
 Daily Session Note  Patient Details  Name: Terrance Hawkins MRN: [id number] Date of Birth: 07/10/1990 Referring Provider:   Flowsheet Row Cardiac Rehab from 11/30/2023 in [facility] Cardiac and Pulmonary Rehab  Referring Provider Dr. Virgene Cage    Encounter Date: 12/27/2023  Check In:  Session Check In - 12/27/23 1355       Check-In   Supervising physician immediately available to respond to emergencies See telemetry face sheet for immediately available ER MD    Location [facility]-Cardiac & Pulmonary Rehab    Staff Present Burnard Davenport RN,BSN,MPA;Laura Cates RN,BSN;Madaline Lefeber Dyane BS, ACSM CEP, Exercise Physiologist;Noah Tickle, BS, Exercise Physiologist    Virtual Visit No    Medication changes reported     No    Fall or balance concerns reported    No    Warm-up and Cool-down Performed on first and last piece of equipment    Resistance Training Performed Yes    VAD Patient? No    PAD/SET Patient? No      Pain Assessment   Currently in Pain? No/denies             Social History   Tobacco Use  Smoking Status Every Day   Current packs/day: 0.50   Types: Cigarettes  Smokeless Tobacco Never    Goals Met:  Independence with exercise equipment Exercise tolerated well No report of concerns or symptoms today Strength training completed today  Goals Unmet:  Not Applicable  Comments: Pt able to follow exercise prescription today without complaint.  Will continue to monitor for progression.    Dr. Oneil Pinal is Medical Director for [facility] Cardiac Rehabilitation.  Dr. Fuad Aleskerov is Medical Director for [facility] Pulmonary Rehabilitation.

## 2024-01-01 ENCOUNTER — Encounter: Admitting: Emergency Medicine

## 2024-01-01 DIAGNOSIS — I214 Non-ST elevation (NSTEMI) myocardial infarction: Secondary | ICD-10-CM

## 2024-01-01 NOTE — Progress Notes (Signed)
 Daily Session Note  Patient Details  Name: Terrance Hawkins MRN: [id number] Date of Birth: 1990/06/24 Referring Provider:   Flowsheet Row Cardiac Rehab from 11/30/2023 in [facility] Cardiac and Pulmonary Rehab  Referring Provider Dr. Virgene Cage    Encounter Date: 01/01/2024  Check In:  Session Check In - 01/01/24 1359       Check-In   Supervising physician immediately available to respond to emergencies See telemetry face sheet for immediately available ER MD    Location [facility]-Cardiac & Pulmonary Rehab    Staff Present Leita Franks RN,BSN;Maxon Burnell BS, Exercise Physiologist;Margaret Best, MS, Exercise Physiologist;Noah Tickle, BS, Exercise Physiologist    Virtual Visit No    Medication changes reported     No    Fall or balance concerns reported    No    Warm-up and Cool-down Performed on first and last piece of equipment    Resistance Training Performed Yes    VAD Patient? No    PAD/SET Patient? No      Pain Assessment   Currently in Pain? No/denies             Social History   Tobacco Use  Smoking Status Every Day   Current packs/day: 0.50   Types: Cigarettes  Smokeless Tobacco Never    Goals Met:  Independence with exercise equipment Exercise tolerated well No report of concerns or symptoms today Strength training completed today  Goals Unmet:  Not Applicable  Comments: Pt able to follow exercise prescription today without complaint.  Will continue to monitor for progression.    Dr. Oneil Pinal is Medical Director for [facility] Cardiac Rehabilitation.  Dr. Fuad Aleskerov is Medical Director for [facility] Pulmonary Rehabilitation.

## 2024-01-03 ENCOUNTER — Encounter: Admitting: Emergency Medicine

## 2024-01-03 DIAGNOSIS — I214 Non-ST elevation (NSTEMI) myocardial infarction: Secondary | ICD-10-CM

## 2024-01-03 NOTE — Progress Notes (Signed)
 Daily Session Note  Patient Details  Name: ERHARDT DADA MRN: [id number] Date of Birth: 1990-04-07 Referring Provider:   Flowsheet Row Cardiac Rehab from 11/30/2023 in [facility] Cardiac and Pulmonary Rehab  Referring Provider Dr. Virgene Cage    Encounter Date: 01/03/2024  Check In:  Session Check In - 01/03/24 1405       Check-In   Supervising physician immediately available to respond to emergencies See telemetry face sheet for immediately available ER MD    Location [facility]-Cardiac & Pulmonary Rehab    Staff Present Leita Franks RN,BSN;Noah Tickle, BS, Exercise Physiologist;Kelly Dyane HECKLE, ACSM CEP, Exercise Physiologist;Kelly Bollinger [facility]    Virtual Visit No    Medication changes reported     No    Fall or balance concerns reported    No    Warm-up and Cool-down Performed on first and last piece of equipment    Resistance Training Performed Yes    VAD Patient? No    PAD/SET Patient? No      Pain Assessment   Currently in Pain? No/denies             Social History   Tobacco Use  Smoking Status Every Day   Current packs/day: 0.50   Types: Cigarettes  Smokeless Tobacco Never    Goals Met:  Independence with exercise equipment Exercise tolerated well No report of concerns or symptoms today Strength training completed today  Goals Unmet:  Not Applicable  Comments: Pt able to follow exercise prescription today without complaint.  Will continue to monitor for progression.    Dr. Oneil Pinal is Medical Director for [facility] Cardiac Rehabilitation.  Dr. Fuad Aleskerov is Medical Director for [facility] Pulmonary Rehabilitation.

## 2024-01-08 ENCOUNTER — Encounter

## 2024-01-10 ENCOUNTER — Encounter

## 2024-01-10 DIAGNOSIS — I214 Non-ST elevation (NSTEMI) myocardial infarction: Secondary | ICD-10-CM

## 2024-01-10 NOTE — Progress Notes (Signed)
 Cardiac Individual Treatment Plan  Patient Details  Name: Terrance Hawkins MRN: [id number] Date of Birth: January 02, 1991 Referring Provider:   Flowsheet Row Cardiac Rehab from 11/30/2023 in [facility] Cardiac and Pulmonary Rehab  Referring Provider Dr. Virgene Cage    Initial Encounter Date:  Flowsheet Row Cardiac Rehab from 11/30/2023 in [facility] Cardiac and Pulmonary Rehab  Date 11/30/23    Visit Diagnosis: NSTEMI (non-ST elevated myocardial infarction) [facility]  Patient's Home Medications on Admission: Current Medications[1]  Past Medical History: Past Medical History:  Diagnosis Date   CHF (congestive heart failure) (HCC)    Hypertension     Tobacco Use: Tobacco Use History[2]  Labs: Review Flowsheet  More data exists      Latest Ref Rng & Units 09/08/2014 03/19/2017 10/04/2023 10/06/2023 10/07/2023  Labs for ITP Cardiac and Pulmonary Rehab  Cholestrol 0 - 200 mg/dL 849  - - - 844   LDL (calc) 0 - 99 mg/dL 95  - - - 97   HDL-C >59 mg/dL 29  - - - 35   Trlycerides <150 mg/dL 867  - - - 883   Hemoglobin A1c 4.8 - 5.6 % 5.3  5.3  - - -  PH, Arterial 7.35 - 7.45 - - 7.49  7.470  -  PCO2 arterial 32 - 48 mmHg - - 32  30.7  -  Bicarbonate 20.0 - 28.0 mmol/L - - 24.4  24.1  22.4  -  TCO2 22 - 32 mmol/L - - - 25  23  -  O2 Saturation % - - 90.3  61  88  -    Details       Multiple values from one day are sorted in reverse-chronological order          Exercise Target Goals: Exercise Program Goal: Individual exercise prescription set using results from initial 6 min walk test and THRR while considering  patients activity barriers and safety.   Exercise Prescription Goal: Initial exercise prescription builds to 30-45 minutes a day of aerobic activity, 2-3 days per week.  Home exercise guidelines will be given to patient during program as part of exercise prescription that the participant will acknowledge.   Education: Aerobic Exercise: - Group verbal and visual presentation on  the components of exercise prescription. Introduces F.I.T.T principle from ACSM for exercise prescriptions.  Reviews F.I.T.T. principles of aerobic exercise including progression. Written material provided at class time.   Education: Resistance Exercise: - Group verbal and visual presentation on the components of exercise prescription. Introduces F.I.T.T principle from ACSM for exercise prescriptions  Reviews F.I.T.T. principles of resistance exercise including progression. Written material provided at class time.    Education: Exercise & Equipment Safety: - Individual verbal instruction and demonstration of equipment use and safety with use of the equipment. Flowsheet Row Cardiac Rehab from 11/30/2023 in [facility] Cardiac and Pulmonary Rehab  Date 11/30/23  Educator [facility]  Instruction Review Code 1- Verbalizes Understanding    Education: Exercise Physiology & General Exercise Guidelines: - Group verbal and written instruction with models to review the exercise physiology of the cardiovascular system and associated critical values. Provides general exercise guidelines with specific guidelines to those with heart or lung disease. Written material provided at class time.   Education: Flexibility, Balance, Mind/Body Relaxation: - Group verbal and visual presentation with interactive activity on the components of exercise prescription. Introduces F.I.T.T principle from ACSM for exercise prescriptions. Reviews F.I.T.T. principles of flexibility and balance exercise training including progression. Also discusses the mind body  connection.  Reviews various relaxation techniques to help reduce and manage stress (i.e. Deep breathing, progressive muscle relaxation, and visualization). Balance handout provided to take home. Written material provided at class time.   Activity Barriers & Risk Stratification:  Activity Barriers & Cardiac Risk Stratification - 11/30/23 1548       Activity Barriers & Cardiac Risk  Stratification   Activity Barriers Joint Problems    Cardiac Risk Stratification High          6 Minute Walk:  6 Minute Walk     Row Name 11/30/23 1547         6 Minute Walk   Phase Initial     Distance 680 feet     Walk Time 4.33 minutes     # of Rest Breaks 2     MPH 1.79     METS 3.6     RPE 13     Perceived Dyspnea  2     VO2 Peak 12.7     Symptoms Yes (comment)     Comments bilater knee, and lower lag pain     Resting HR 76 bpm     Resting BP 132/86     Resting Oxygen Saturation  96 %     Exercise Oxygen Saturation  during 6 min walk 98 %     Max Ex. HR 121 bpm     Max Ex. BP 164/88     2 Minute Post BP 144/88        Oxygen Initial Assessment:   Oxygen Re-Evaluation:   Oxygen Discharge (Final Oxygen Re-Evaluation):   Initial Exercise Prescription:  Initial Exercise Prescription - 11/30/23 1500       Date of Initial Exercise RX and Referring Provider   Date 11/30/23    Referring Provider Dr. Virgene Cage      Oxygen   Oxygen --    Maintain Oxygen Saturation 88% or higher      Recumbant Bike   Level 3    RPM 50    Watts 25    Minutes 15    METs 3.6      Arm Ergometer   Level 2    Watts 25    RPM 25    Minutes 15    METs 3.6      T5 Nustep   Level 3    SPM 80    Minutes 15    METs 3.6      Prescription Details   Duration Progress to 30 minutes of continuous aerobic without signs/symptoms of physical distress      Intensity   THRR 40-80% of Max Heartrate 120-164    Ratings of Perceived Exertion 11-13    Perceived Dyspnea 0-4      Progression   Progression Continue to progress workloads to maintain intensity without signs/symptoms of physical distress.      Resistance Training   Training Prescription Yes    Weight 10lb    Reps 10-15          Perform Capillary Blood Glucose checks as needed.  Exercise Prescription Changes:   Exercise Prescription Changes     Row Name 11/30/23 1500 12/18/23 1100 01/03/24 1500          Response to Exercise   Blood Pressure (Admit) 132/86 142/90 130/80     Blood Pressure (Exercise) 164/88 180/100 160/74     Blood Pressure (Exit) 144/88 170/100 136/84     Heart Rate (Admit) 76 bpm 92  bpm 79 bpm     Heart Rate (Exercise) 121 bpm 119 bpm 126 bpm     Heart Rate (Exit) 80 bpm 97 bpm 94 bpm     Oxygen Saturation (Admit) 96 % -- --     Oxygen Saturation (Exercise) 98 % -- --     Oxygen Saturation (Exit) 98 % -- --     Rating of Perceived Exertion (Exercise) 13 13 13      Perceived Dyspnea (Exercise) 2 -- --     Symptoms bilteral knee, and lower leg pain none none     Comments results 1st 2 weeks of exercise sessions --     Duration -- Progress to 30 minutes of  aerobic without signs/symptoms of physical distress Continue with 30 min of aerobic exercise without signs/symptoms of physical distress.     Intensity -- THRR unchanged THRR unchanged       Progression   Progression -- Continue to progress workloads to maintain intensity without signs/symptoms of physical distress. Continue to progress workloads to maintain intensity without signs/symptoms of physical distress.     Average METs -- 3.3 5.03       Resistance Training   Training Prescription -- Yes Yes     Weight -- 10lb 10 lb     Reps -- 10-15 10-15       Interval Training   Interval Training -- -- No       NuStep   Level -- 5 6     SPM -- 80 --     Minutes -- 15 15     METs -- 5.3 6       Arm Ergometer   Level -- 5 7     Minutes -- 15 15     METs -- 1 4.2       T5 Nustep   Level -- 5 --     Minutes -- 15 --     METs -- 2.2 --       Oxygen   Maintain Oxygen Saturation -- 88% or higher 88% or higher        Exercise Comments:   Exercise Comments     Row Name 12/06/23 1414           Exercise Comments First full day of exercise!  Patient was oriented to gym and equipment including functions, settings, policies, and procedures.  Patient's individual exercise prescription and  treatment plan were reviewed.  All starting workloads were established based on the results of the 6 minute walk test done at initial orientation visit.  The plan for exercise progression was also introduced and progression will be customized based on patient's performance and goals.          Exercise Goals and Review:   Exercise Goals     Row Name 11/30/23 1558             Exercise Goals   Increase Physical Activity Yes       Intervention Provide advice, education, support and counseling about physical activity/exercise needs.;Develop an individualized exercise prescription for aerobic and resistive training based on initial evaluation findings, risk stratification, comorbidities and participant's personal goals.       Expected Outcomes Short Term: Attend rehab on a regular basis to increase amount of physical activity.;Long Term: Add in home exercise to make exercise part of routine and to increase amount of physical activity.;Long Term: Exercising regularly at least 3-5 days a week.  Increase Strength and Stamina Yes       Intervention Provide advice, education, support and counseling about physical activity/exercise needs.;Develop an individualized exercise prescription for aerobic and resistive training based on initial evaluation findings, risk stratification, comorbidities and participant's personal goals.       Expected Outcomes Short Term: Increase workloads from initial exercise prescription for resistance, speed, and METs.;Short Term: Perform resistance training exercises routinely during rehab and add in resistance training at home;Long Term: Improve cardiorespiratory fitness, muscular endurance and strength as measured by increased METs and functional capacity ( )       Able to understand and use rate of perceived exertion (RPE) scale Yes       Intervention Provide education and explanation on how to use RPE scale       Expected Outcomes Short Term: Able to use RPE daily  in rehab to express subjective intensity level;Long Term:  Able to use RPE to guide intensity level when exercising independently       Able to understand and use Dyspnea scale Yes       Intervention Provide education and explanation on how to use Dyspnea scale       Expected Outcomes Short Term: Able to use Dyspnea scale daily in rehab to express subjective sense of shortness of breath during exertion;Long Term: Able to use Dyspnea scale to guide intensity level when exercising independently       Knowledge and understanding of Target Heart Rate Range (THRR) Yes       Intervention Provide education and explanation of THRR including how the numbers were predicted and where they are located for reference       Expected Outcomes Short Term: Able to state/look up THRR;Short Term: Able to use daily as guideline for intensity in rehab;Long Term: Able to use THRR to govern intensity when exercising independently       Able to check pulse independently Yes       Intervention Provide education and demonstration on how to check pulse in carotid and radial arteries.;Review the importance of being able to check your own pulse for safety during independent exercise       Expected Outcomes Short Term: Able to explain why pulse checking is important during independent exercise;Long Term: Able to check pulse independently and accurately       Understanding of Exercise Prescription Yes       Intervention Provide education, explanation, and written materials on patient's individual exercise prescription       Expected Outcomes Short Term: Able to explain program exercise prescription;Long Term: Able to explain home exercise prescription to exercise independently          Exercise Goals Re-Evaluation :  Exercise Goals Re-Evaluation     Row Name 12/06/23 1414 12/18/23 1146 01/03/24 1558         Exercise Goal Re-Evaluation   Exercise Goals Review Increase Physical Activity;Able to understand and use rate of  perceived exertion (RPE) scale;Knowledge and understanding of Target Heart Rate Range (THRR);Understanding of Exercise Prescription;Increase Strength and Stamina;Able to understand and use Dyspnea scale;Able to check pulse independently Increase Physical Activity;Understanding of Exercise Prescription;Increase Strength and Stamina Increase Physical Activity;Understanding of Exercise Prescription;Increase Strength and Stamina     Comments Reviewed RPE and dyspnea scale, THR and program prescription with pt today.  Pt voiced understanding and was given a copy of goals to take home. Terrance Hawkins is off to a good start in the program. He completed his first 2 weeks of exercise  sessions in this review. He worked at level 5 on the T4 nustep, T5 nustep, and arm ergometer. We will continue to monitor his progress in the program. Terrance Hawkins is doing well in the program. He improved to level 6 on the T4 nustep and level 7 on the arm ergometer. He has also done well with 10 lb hand weights for resistance training. We will continue to monitor his progress in the program.     Expected Outcomes Short: Use RPE daily to regulate intensity. Long: Follow program prescription in THR. Short: Continue to follow current exercise prescription. Long: Continue exercise to improve strength and stamina. Short: Continue to progressively increase workloads. Long: Continue exercise to improve strength and stamina.        Discharge Exercise Prescription (Final Exercise Prescription Changes):  Exercise Prescription Changes - 01/03/24 1500       Response to Exercise   Blood Pressure (Admit) 130/80    Blood Pressure (Exercise) 160/74    Blood Pressure (Exit) 136/84    Heart Rate (Admit) 79 bpm    Heart Rate (Exercise) 126 bpm    Heart Rate (Exit) 94 bpm    Rating of Perceived Exertion (Exercise) 13    Symptoms none    Duration Continue with 30 min of aerobic exercise without signs/symptoms of physical distress.    Intensity THRR  unchanged      Progression   Progression Continue to progress workloads to maintain intensity without signs/symptoms of physical distress.    Average METs 5.03      Resistance Training   Training Prescription Yes    Weight 10 lb    Reps 10-15      Interval Training   Interval Training No      NuStep   Level 6    Minutes 15    METs 6      Arm Ergometer   Level 7    Minutes 15    METs 4.2      Oxygen   Maintain Oxygen Saturation 88% or higher          Nutrition:  Target Goals: Understanding of nutrition guidelines, daily intake of sodium 1500mg , cholesterol 200mg , calories 30% from fat and 7% or less from saturated fats, daily to have 5 or more servings of fruits and vegetables.  Education: Nutrition 1 -Group instruction provided by verbal, written material, interactive activities, discussions, models, and posters to present general guidelines for heart healthy nutrition including macronutrients, label reading, and promoting whole foods over processed counterparts. Education serves as pensions consultant of discussion of heart healthy eating for all. Written material provided at class time.    Education: Nutrition 2 -Group instruction provided by verbal, written material, interactive activities, discussions, models, and posters to present general guidelines for heart healthy nutrition including sodium, cholesterol, and saturated fat. Providing guidance of habit forming to improve blood pressure, cholesterol, and body weight. Written material provided at class time.     Biometrics:  Pre Biometrics - 11/30/23 1558       Pre Biometrics   Height 5' 8.1 (1.73 m)    Weight 254 lb 14.4 oz (115.6 kg)    Waist Circumference 46 inches    Hip Circumference 40 inches    Waist to Hip Ratio 1.15 %    BMI (Calculated) 38.63    Single Leg Stand 30 seconds           Nutrition Therapy Plan and Nutrition Goals:   Nutrition Assessments:  MEDIFICTS Score Key: >=70  Need to make  dietary changes  40-70 Heart Healthy Diet <= 40 Therapeutic Level Cholesterol Diet   Picture Your Plate Scores: <59 Unhealthy dietary pattern with much room for improvement. 41-50 Dietary pattern unlikely to meet recommendations for good health and room for improvement. 51-60 More healthful dietary pattern, with some room for improvement.  >60 Healthy dietary pattern, although there may be some specific behaviors that could be improved.    Nutrition Goals Re-Evaluation:   Nutrition Goals Discharge (Final Nutrition Goals Re-Evaluation):   Psychosocial: Target Goals: Acknowledge presence or absence of significant depression and/or stress, maximize coping skills, provide positive support system. Participant is able to verbalize types and ability to use techniques and skills needed for reducing stress and depression.   Education: Stress, Anxiety, and Depression - Group verbal and visual presentation to define topics covered.  Reviews how body is impacted by stress, anxiety, and depression.  Also discusses healthy ways to reduce stress and to treat/manage anxiety and depression. Written material provided at class time.   Education: Sleep Hygiene -Provides group verbal and written instruction about how sleep can affect your health.  Define sleep hygiene, discuss sleep cycles and impact of sleep habits. Review good sleep hygiene tips.   Initial Review & Psychosocial Screening:  Initial Psych Review & Screening - 11/23/23 1356       Initial Review   Current issues with Current Depression;Current Anxiety/Panic;Current Psychotropic Meds      Family Dynamics   Good Support System? Yes   patient feels supported but did not give specific individuals   Comments Terrance Hawkins states he is excited to try cardiac rehab. He was hesitant regarding ability to come 2-3 times per week but states he will try. Terrance Hawkins reports that he is currently taking medication for anxiety and depression and that he  enjoys working out when he feels overwhelmed and stressed out. Terrance Hawkins reports that he currenlty smokes tobacco and that he uses marijuana and cocain occasionally, however states he is trying to limit his cocaine use due to recent heart event. He does report bilateral knee pain, but states it usually does not limit his quality of life.      Barriers   Psychosocial barriers to participate in program The patient should benefit from training in stress management and relaxation.      Screening Interventions   Interventions Encouraged to exercise;To provide support and resources with identified psychosocial needs;Provide feedback about the scores to participant    Expected Outcomes Short Term goal: Utilizing psychosocial counselor, staff and physician to assist with identification of specific Stressors or current issues interfering with healing process. Setting desired goal for each stressor or current issue identified.;Long Term Goal: Stressors or current issues are controlled or eliminated.;Short Term goal: Identification and review with participant of any Quality of Life or Depression concerns found by scoring the questionnaire.;Long Term goal: The participant improves quality of Life and PHQ9 Scores as seen by post scores and/or verbalization of changes          Quality of Life Scores:   Scores of 19 and below usually indicate a poorer quality of life in these areas.  A difference of  2-3 points is a clinically meaningful difference.  A difference of 2-3 points in the total score of the Quality of Life Index has been associated with significant improvement in overall quality of life, self-image, physical symptoms, and general health in studies assessing change in quality of life.  PHQ-9: Review Flowsheet  11/30/2023 09/07/2017 09/26/2014  Depression screen PHQ 2/9  Decreased Interest 3 0 3  Down, Depressed, Hopeless 3 1 3   PHQ - 2 Score 6 1 6   Altered sleeping 3 - 2  Tired, decreased  energy 3 - 0  Change in appetite 0 - 1  Feeling bad or failure about yourself  3 - 3  Trouble concentrating 3 - 0  Moving slowly or fidgety/restless 3 - 0  Suicidal thoughts 0 - 0   PHQ-9 Score 21 - 12   Difficult doing work/chores Extremely dIfficult - Not difficult at all    Details       Data saved with a previous flowsheet row definition        Interpretation of Total Score  Total Score Depression Severity:  1-4 = Minimal depression, 5-9 = Mild depression, 10-14 = Moderate depression, 15-19 = Moderately severe depression, 20-27 = Severe depression   Psychosocial Evaluation and Intervention:  Psychosocial Evaluation - 11/23/23 1401       Psychosocial Evaluation & Interventions   Interventions Encouraged to exercise with the program and follow exercise prescription;Stress management education;Relaxation education    Comments Terrance Hawkins states he is excited to try cardiac rehab. He was hesitant regarding ability to come 2-3 times per week but states he will try. Terrance Hawkins reports that he is currently taking medication for anxiety and depression and that he enjoys working out when he feels overwhelmed and stressed out. Terrance Hawkins reports that he currenlty smokes tobacco and that he uses marijuana and cocain occasionally, however states he is trying to limit his cocaine use due to recent heart event. He does report bilateral knee pain, but states it usually does not limit his quality of life.    Expected Outcomes Short: Attend cardiac rehab for education and exercise. Long: Develop and maintain positive self care habits.    Continue Psychosocial Services  Follow up required by staff          Psychosocial Re-Evaluation:   Psychosocial Discharge (Final Psychosocial Re-Evaluation):   Vocational Rehabilitation: Provide vocational rehab assistance to qualifying candidates.   Vocational Rehab Evaluation & Intervention:  Vocational Rehab - 11/23/23 1356       Initial Vocational  Rehab Evaluation & Intervention   Assessment shows need for Vocational Rehabilitation No          Education: Education Goals: Education classes will be provided on a variety of topics geared toward better understanding of heart health and risk factor modification. Participant will state understanding/return demonstration of topics presented as noted by education test scores.  Learning Barriers/Preferences:  Learning Barriers/Preferences - 11/23/23 1356       Learning Barriers/Preferences   Learning Barriers None    Learning Preferences None          General Cardiac Education Topics:  AED/CPR: - Group verbal and written instruction with the use of models to demonstrate the basic use of the AED with the basic ABC's of resuscitation.   Test and Procedures: - Group verbal and visual presentation and models provide information about basic cardiac anatomy and function. Reviews the testing methods done to diagnose heart disease and the outcomes of the test results. Describes the treatment choices: Medical Management, Angioplasty, or Coronary Bypass Surgery for treating various heart conditions including Myocardial Infarction, Angina, Valve Disease, and Cardiac Arrhythmias. Written material provided at class time.   Medication Safety: - Group verbal and visual instruction to review commonly prescribed medications for heart and lung disease. Reviews the medication, class of  the drug, and side effects. Includes the steps to properly store meds and maintain the prescription regimen. Written material provided at class time.   Intimacy: - Group verbal instruction through game format to discuss how heart and lung disease can affect sexual intimacy. Written material provided at class time.   Know Your Numbers and Heart Failure: - Group verbal and visual instruction to discuss disease risk factors for cardiac and pulmonary disease and treatment options.  Reviews associated critical values  for Overweight/Obesity, Hypertension, Cholesterol, and Diabetes.  Discusses basics of heart failure: signs/symptoms and treatments.  Introduces Heart Failure Zone chart for action plan for heart failure. Written material provided at class time.   Infection Prevention: - Provides verbal and written material to individual with discussion of infection control including proper hand washing and proper equipment cleaning during exercise session. Flowsheet Row Cardiac Rehab from 11/30/2023 in [facility] Cardiac and Pulmonary Rehab  Date 11/30/23  Educator [facility]  Instruction Review Code 1- Verbalizes Understanding    Falls Prevention: - Provides verbal and written material to individual with discussion of falls prevention and safety. Flowsheet Row Cardiac Rehab from 11/30/2023 in [facility] [facility]  Date 11/30/23  Educator [facility]  Instruction Review Code 1- Verbalizes Understanding    Other: -Provides group and verbal instruction on various topics (see comments)   Knowledge Questionnaire Score:   Core Components/Risk Factors/Patient Goals at Admission:  Personal Goals and Risk Factors at Admission - 11/23/23 1354       Core Components/Risk Factors/Patient Goals on Admission    Weight Management Yes;Weight Loss    Intervention Weight Management: Develop a combined nutrition and exercise program designed to reach desired caloric intake, while maintaining appropriate intake of nutrient and fiber, sodium and fats, and appropriate energy expenditure required for the weight goal.;Weight Management: Provide education and appropriate resources to help participant work on and attain dietary goals.;Weight Management/Obesity: Establish reasonable short term and long term weight goals.;Obesity: Provide education and appropriate resources to help participant work on and attain dietary goals.    Goal Weight: Short Term 225 lb (102.1 kg)    Goal Weight: Long Term 200 lb (90.7 kg)    Expected Outcomes  Short Term: Continue to assess and modify interventions until short term weight is achieved;Long Term: Adherence to nutrition and physical activity/exercise program aimed toward attainment of established weight goal;Weight Loss: Understanding of general recommendations for a balanced deficit meal plan, which promotes 1-2 lb weight loss per week and includes a negative energy balance of [phone] kcal/d;Understanding recommendations for meals to include 15-35% energy as protein, 25-35% energy from fat, 35-60% energy from carbohydrates, less than 200mg  of dietary cholesterol, 20-35 gm of total fiber daily;Understanding of distribution of calorie intake throughout the day with the consumption of 4-5 meals/snacks    Tobacco Cessation Yes    Number of packs per day 0.5    Intervention Assist the participant in steps to quit. Provide individualized education and counseling about committing to Tobacco Cessation, relapse prevention, and pharmacological support that can be provided by physician.;Education officer, environmental, assist with locating and accessing local/national Quit Smoking programs, and support quit date choice.    Expected Outcomes Short Term: Will demonstrate readiness to quit, by selecting a quit date.;Long Term: Complete abstinence from all tobacco products for at least 12 months from quit date.;Short Term: Will quit all tobacco product use, adhering to prevention of relapse plan.    Heart Failure Yes    Intervention Provide a combined exercise and  nutrition program that is supplemented with education, support and counseling about heart failure. Directed toward relieving symptoms such as shortness of breath, decreased exercise tolerance, and extremity edema.    Expected Outcomes Improve functional capacity of life;Short term: Attendance in program 2-3 days a week with increased exercise capacity. Reported lower sodium intake. Reported increased fruit and vegetable intake. Reports medication  compliance.;Short term: Daily weights obtained and reported for increase. Utilizing diuretic protocols set by physician.;Long term: Adoption of self-care skills and reduction of barriers for early signs and symptoms recognition and intervention leading to self-care maintenance.    Hypertension Yes    Intervention Provide education on lifestyle modifcations including regular physical activity/exercise, weight management, moderate sodium restriction and increased consumption of fresh fruit, vegetables, and low fat dairy, alcohol moderation, and smoking cessation.;Monitor prescription use compliance.    Expected Outcomes Short Term: Continued assessment and intervention until BP is < 140/39mm HG in hypertensive participants. < 130/50mm HG in hypertensive participants with diabetes, heart failure or chronic kidney disease.;Long Term: Maintenance of blood pressure at goal levels.          Education:Diabetes - Individual verbal and written instruction to review signs/symptoms of diabetes, desired ranges of glucose level fasting, after meals and with exercise. Acknowledge that pre and post exercise glucose checks will be done for 3 sessions at entry of program.   Core Components/Risk Factors/Patient Goals Review:    Core Components/Risk Factors/Patient Goals at Discharge (Final Review):    ITP Comments:  ITP Comments     Row Name 11/23/23 1345 11/30/23 1546 12/06/23 1414 01/10/24 0930     ITP Comments Initial phone call completed. Diagnosis can be found in [facility] 10/11/23. EP Orientation scheduled for 11/30/23 at 14:00.  Compton is a current tobacco user. Intervention for tobacco cessation was provided at the initial medical review. He was asked about readiness to quit and reported he has decreased use but is not ready to quit completely . Patient was advised and educated about tobacco cessation using combination therapy, tobacco cessation classes, quit line, and quit smoking apps. Patient demonstrated  understanding of this material. Staff will continue to provide encouragement and follow up with the patient throughout the program. Completed and gym orientation for cardiac rehab. Initial ITP created and sent for review to Dr. Oneil Pinal, Medical Director. First full day of exercise!  Patient was oriented to gym and equipment including functions, settings, policies, and procedures.  Patient's individual exercise prescription and treatment plan were reviewed.  All starting workloads were established based on the results of the 6 minute walk test done at initial orientation visit.  The plan for exercise progression was also introduced and progression will be customized based on patient's performance and goals. 30 Day review completed. Medical Director ITP review done, changes made as directed, and signed approval by Medical Director. New to program.       Comments: 30 day review     [1]  Current Outpatient Medications:    aspirin  EC 81 MG tablet, Take 1 tablet (81 mg total) by mouth daily. Swallow whole., Disp: 30 tablet, Rfl: 12   atorvastatin  (LIPITOR) 40 MG tablet, Take 1 tablet (40 mg total) by mouth daily., Disp: 30 tablet, Rfl: 5   carvedilol  (COREG ) 6.25 MG tablet, Take 1 tablet (6.25 mg total) by mouth 2 (two) times daily., Disp: 60 tablet, Rfl: 5   clopidogrel  (PLAVIX ) 75 MG tablet, Take 1 tablet (75 mg total) by mouth daily with breakfast., Disp: 30 tablet,  Rfl: 5   empagliflozin  (JARDIANCE ) 10 MG TABS tablet, Take 1 tablet (10 mg total) by mouth daily., Disp: 30 tablet, Rfl: 5   furosemide  (LASIX ) 40 MG tablet, Take 1 tablet (40 mg total) by mouth daily., Disp: 30 tablet, Rfl: 5   sacubitril -valsartan  (ENTRESTO ) 49-51 MG, Take 1 tablet by mouth 2 (two) times daily., Disp: 60 tablet, Rfl: 5   spironolactone  (ALDACTONE ) 25 MG tablet, Take 1 tablet (25 mg total) by mouth daily., Disp: 30 tablet, Rfl: 5   Venlafaxine HCl 37.5 MG TB24, Take 37.5 mg by mouth daily at 12 noon., Disp: ,  Rfl:  [2]  Social History Tobacco Use  Smoking Status Every Day   Current packs/day: 0.50   Types: Cigarettes  Smokeless Tobacco Never

## 2024-01-15 ENCOUNTER — Encounter: Admitting: Emergency Medicine

## 2024-01-15 DIAGNOSIS — I214 Non-ST elevation (NSTEMI) myocardial infarction: Secondary | ICD-10-CM | POA: Diagnosis not present

## 2024-01-15 NOTE — Progress Notes (Signed)
 Daily Session Note  Patient Details  Name: Terrance Hawkins MRN: [id number] Date of Birth: 11-19-1990 Referring Provider:   Flowsheet Row Cardiac Rehab from 11/30/2023 in [facility] Cardiac and Pulmonary Rehab  Referring Provider Dr. Virgene Cage    Encounter Date: 01/15/2024  Check In:  Session Check In - 01/15/24 1423       Check-In   Supervising physician immediately available to respond to emergencies See telemetry face sheet for immediately available ER MD    Location [facility]-Cardiac & Pulmonary Rehab    Staff Present Leita Franks RN,BSN;Noah Tickle, BS, Exercise Physiologist;Kristen Coble RN,BC,MSN;Margaret Best, MS, Exercise Physiologist    Virtual Visit No    Medication changes reported     No    Fall or balance concerns reported    No    Warm-up and Cool-down Performed on first and last piece of equipment    Resistance Training Performed Yes    VAD Patient? No    PAD/SET Patient? No      Pain Assessment   Currently in Pain? No/denies             Tobacco Use History[1]  Goals Met:  Independence with exercise equipment Exercise tolerated well No report of concerns or symptoms today Strength training completed today  Goals Unmet:  Not Applicable  Comments: Pt able to follow exercise prescription today without complaint.  Will continue to monitor for progression.    Dr. Oneil Pinal is Medical Director for [facility] Cardiac Rehabilitation.  Dr. Fuad Aleskerov is Medical Director for [facility] Pulmonary Rehabilitation.    [1]  Social History Tobacco Use  Smoking Status Every Day   Current packs/day: 0.50   Types: Cigarettes  Smokeless Tobacco Never

## 2024-01-17 ENCOUNTER — Encounter

## 2024-01-22 ENCOUNTER — Encounter: Admitting: *Deleted

## 2024-01-22 DIAGNOSIS — I214 Non-ST elevation (NSTEMI) myocardial infarction: Secondary | ICD-10-CM

## 2024-01-22 NOTE — Progress Notes (Signed)
 Daily Session Note  Patient Details  Name: Terrance Hawkins MRN: [id number] Date of Birth: 07-05-1990 Referring Provider:   Flowsheet Row Cardiac Rehab from 11/30/2023 in [facility] Cardiac and Pulmonary Rehab  Referring Provider Dr. Virgene Cage    Encounter Date: 01/22/2024  Check In:  Session Check In - 01/22/24 1433       Check-In   Supervising physician immediately available to respond to emergencies See telemetry face sheet for immediately available ER MD    Location [facility]-Cardiac & Pulmonary Rehab    Staff Present Ronal Picket, RN, DNP, NE-BC    Virtual Visit No    Medication changes reported     No    Fall or balance concerns reported    No    Warm-up and Cool-down Performed on first and last piece of equipment    Resistance Training Performed Yes    VAD Patient? No      Pain Assessment   Currently in Pain? No/denies             Tobacco Use History[1]  Goals Met:  Independence with exercise equipment Exercise tolerated well No report of concerns or symptoms today Strength training completed today  Goals Unmet:  Not Applicable  Comments: Pt able to follow exercise prescription today without complaint.  Will continue to monitor for progression.    Dr. Oneil Pinal is Medical Director for [facility] Cardiac Rehabilitation.  Dr. Fuad Aleskerov is Medical Director for [facility] Pulmonary Rehabilitation.    [1]  Social History Tobacco Use  Smoking Status Every Day   Current packs/day: 0.50   Types: Cigarettes  Smokeless Tobacco Never

## 2024-01-24 ENCOUNTER — Encounter

## 2024-01-24 DIAGNOSIS — I214 Non-ST elevation (NSTEMI) myocardial infarction: Secondary | ICD-10-CM | POA: Diagnosis not present

## 2024-01-24 NOTE — Progress Notes (Signed)
 Daily Session Note  Patient Details  Name: Terrance Hawkins MRN: [id number] Date of Birth: Aug 04, 1990 Referring Provider:   Flowsheet Row Cardiac Rehab from 11/30/2023 in [facility] Cardiac and Pulmonary Rehab  Referring Provider Dr. Virgene Cage    Encounter Date: 01/24/2024  Check In:  Session Check In - 01/24/24 1405       Check-In   Supervising physician immediately available to respond to emergencies See telemetry face sheet for immediately available ER MD    Location [facility]-Cardiac & Pulmonary Rehab    Staff Present Burnard Davenport RN,BSN,MPA;Laura Cates RN,BSN;Noah Laird, BS, Exercise Physiologist;Laureen Delores, BS, RRT, CPFT    Virtual Visit No    Medication changes reported     No    Fall or balance concerns reported    No    Warm-up and Cool-down Performed on first and last piece of equipment    Resistance Training Performed Yes    VAD Patient? No    PAD/SET Patient? No      Pain Assessment   Currently in Pain? No/denies             Tobacco Use History[1]  Goals Met:  Independence with exercise equipment Exercise tolerated well No report of concerns or symptoms today Strength training completed today  Goals Unmet:  Not Applicable  Comments: Pt able to follow exercise prescription today without complaint.  Will continue to monitor for progression.    Dr. Oneil Pinal is Medical Director for [facility] Cardiac Rehabilitation.  Dr. Fuad Aleskerov is Medical Director for [facility] Pulmonary Rehabilitation.    [1]  Social History Tobacco Use  Smoking Status Every Day   Current packs/day: 0.50   Types: Cigarettes  Smokeless Tobacco Never

## 2024-01-29 ENCOUNTER — Encounter: Attending: Cardiovascular Disease | Admitting: Emergency Medicine

## 2024-01-29 DIAGNOSIS — Z5189 Encounter for other specified aftercare: Secondary | ICD-10-CM | POA: Diagnosis present

## 2024-01-29 DIAGNOSIS — F17210 Nicotine dependence, cigarettes, uncomplicated: Secondary | ICD-10-CM | POA: Insufficient documentation

## 2024-01-29 DIAGNOSIS — I252 Old myocardial infarction: Secondary | ICD-10-CM | POA: Insufficient documentation

## 2024-01-29 DIAGNOSIS — I214 Non-ST elevation (NSTEMI) myocardial infarction: Secondary | ICD-10-CM

## 2024-01-29 NOTE — Progress Notes (Signed)
 Daily Session Note  Patient Details  Name: Terrance Hawkins MRN: [id number] Date of Birth: 04-16-1990 Referring Provider:   Flowsheet Row Cardiac Rehab from 11/30/2023 in [facility] Cardiac and Pulmonary Rehab  Referring Provider Dr. Virgene Cage    Encounter Date: 01/29/2024  Check In:  Session Check In - 01/29/24 1401       Check-In   Supervising physician immediately available to respond to emergencies See telemetry face sheet for immediately available ER MD    Location [facility]-Cardiac & Pulmonary Rehab    Staff Present Leita Franks RN,BSN;Maxon Burnell BS, Exercise Physiologist;Margaret Best, MS, Exercise Physiologist;Noah Tickle, BS, Exercise Physiologist    Virtual Visit No    Medication changes reported     No    Fall or balance concerns reported    No    Warm-up and Cool-down Performed on first and last piece of equipment    Resistance Training Performed Yes    VAD Patient? No    PAD/SET Patient? No      Pain Assessment   Currently in Pain? No/denies             Tobacco Use History[1]  Goals Met:  Independence with exercise equipment Exercise tolerated well No report of concerns or symptoms today Strength training completed today  Goals Unmet:  Not Applicable  Comments: Pt able to follow exercise prescription today without complaint.  Will continue to monitor for progression.    Dr. Oneil Pinal is Medical Director for [facility] Cardiac Rehabilitation.  Dr. Fuad Aleskerov is Medical Director for [facility] Pulmonary Rehabilitation.    [1]  Social History Tobacco Use  Smoking Status Every Day   Current packs/day: 0.50   Types: Cigarettes  Smokeless Tobacco Never

## 2024-01-31 ENCOUNTER — Encounter

## 2024-01-31 DIAGNOSIS — Z5189 Encounter for other specified aftercare: Secondary | ICD-10-CM | POA: Diagnosis not present

## 2024-01-31 DIAGNOSIS — I214 Non-ST elevation (NSTEMI) myocardial infarction: Secondary | ICD-10-CM

## 2024-01-31 NOTE — Progress Notes (Signed)
 Daily Session Note  Patient Details  Name: Terrance Hawkins MRN: [id number] Date of Birth: 31-Mar-1990 Referring Provider:   Flowsheet Row Cardiac Rehab from 11/30/2023 in [facility] Cardiac and Pulmonary Rehab  Referring Provider Dr. Virgene Cage    Encounter Date: 01/31/2024  Check In:  Session Check In - 01/31/24 1356       Check-In   Supervising physician immediately available to respond to emergencies See telemetry face sheet for immediately available ER MD    Location [facility]-Cardiac & Pulmonary Rehab    Staff Present Burnard Davenport RN,BSN,MPA;Laura Cates RN,BSN;Noah Laird HECKLE, Exercise Physiologist;Marietta Sikkema Dyane HECKLE, ACSM CEP, Exercise Physiologist    Virtual Visit No    Medication changes reported     No    Fall or balance concerns reported    No    Warm-up and Cool-down Performed on first and last piece of equipment    Resistance Training Performed Yes    VAD Patient? No    PAD/SET Patient? No      Pain Assessment   Currently in Pain? No/denies             Tobacco Use History[1]  Goals Met:  Independence with exercise equipment Exercise tolerated well No report of concerns or symptoms today Strength training completed today  Goals Unmet:  Not Applicable  Comments: Pt able to follow exercise prescription today without complaint.  Will continue to monitor for progression.    Dr. Oneil Pinal is Medical Director for [facility] Cardiac Rehabilitation.  Dr. Fuad Aleskerov is Medical Director for [facility] Pulmonary Rehabilitation.    [1]  Social History Tobacco Use  Smoking Status Every Day   Current packs/day: 0.50   Types: Cigarettes  Smokeless Tobacco Never

## 2024-02-05 ENCOUNTER — Encounter

## 2024-02-07 ENCOUNTER — Encounter: Payer: Self-pay | Admitting: *Deleted

## 2024-02-07 ENCOUNTER — Encounter

## 2024-02-07 DIAGNOSIS — I214 Non-ST elevation (NSTEMI) myocardial infarction: Secondary | ICD-10-CM

## 2024-02-07 NOTE — Progress Notes (Signed)
 Cardiac Individual Treatment Plan  Patient Details  Name: Terrance Hawkins MRN: [id number] Date of Birth: 19-Sep-1990 Referring Provider:   Flowsheet Row Cardiac Rehab from 11/30/2023 in [facility] Cardiac and Pulmonary Rehab  Referring Provider Dr. Virgene Cage    Initial Encounter Date:  Flowsheet Row Cardiac Rehab from 11/30/2023 in [facility] Cardiac and Pulmonary Rehab  Date 11/30/23    Visit Diagnosis: NSTEMI (non-ST elevated myocardial infarction) [facility])  Patient's Home Medications on Admission: Current Medications[1]  Past Medical History: Past Medical History:  Diagnosis Date   CHF (congestive heart failure) (HCC)    Hypertension     Tobacco Use: Tobacco Use History[2]  Labs: Review Flowsheet  More data exists      Latest Ref Rng & Units 09/08/2014 03/19/2017 10/04/2023 10/06/2023 10/07/2023  Labs for ITP Cardiac and Pulmonary Rehab  Cholestrol 0 - 200 mg/dL 849  - - - 844   LDL (calc) 0 - 99 mg/dL 95  - - - 97   HDL-C >59 mg/dL 29  - - - 35   Trlycerides <150 mg/dL 867  - - - 883   Hemoglobin A1c 4.8 - 5.6 % 5.3  5.3  - - -  PH, Arterial 7.35 - 7.45 - - 7.49  7.470  -  PCO2 arterial 32 - 48 mmHg - - 32  30.7  -  Bicarbonate 20.0 - 28.0 mmol/L - - 24.4  24.1  22.4  -  TCO2 22 - 32 mmol/L - - - 25  23  -  O2 Saturation % - - 90.3  61  88  -    Details       Multiple values from one day are sorted in reverse-chronological order          Exercise Target Goals: Exercise Program Goal: Individual exercise prescription set using results from initial 6 min walk test and THRR while considering  patients activity barriers and safety.   Exercise Prescription Goal: Initial exercise prescription builds to 30-45 minutes a day of aerobic activity, 2-3 days per week.  Home exercise guidelines will be given to patient during program as part of exercise prescription that the participant will acknowledge.   Education: Aerobic Exercise: - Group verbal and visual presentation on  the components of exercise prescription. Introduces F.I.T.T principle from ACSM for exercise prescriptions.  Reviews F.I.T.T. principles of aerobic exercise including progression. Written material provided at class time.   Education: Resistance Exercise: - Group verbal and visual presentation on the components of exercise prescription. Introduces F.I.T.T principle from ACSM for exercise prescriptions  Reviews F.I.T.T. principles of resistance exercise including progression. Written material provided at class time.    Education: Exercise & Equipment Safety: - Individual verbal instruction and demonstration of equipment use and safety with use of the equipment. Flowsheet Row Cardiac Rehab from 11/30/2023 in [facility] Cardiac and Pulmonary Rehab  Date 11/30/23  Educator [facility]  Instruction Review Code 1- Verbalizes Understanding    Education: Exercise Physiology & General Exercise Guidelines: - Group verbal and written instruction with models to review the exercise physiology of the cardiovascular system and associated critical values. Provides general exercise guidelines with specific guidelines to those with heart or lung disease. Written material provided at class time.   Education: Flexibility, Balance, Mind/Body Relaxation: - Group verbal and visual presentation with interactive activity on the components of exercise prescription. Introduces F.I.T.T principle from ACSM for exercise prescriptions. Reviews F.I.T.T. principles of flexibility and balance exercise training including progression. Also discusses the mind body  connection.  Reviews various relaxation techniques to help reduce and manage stress (i.e. Deep breathing, progressive muscle relaxation, and visualization). Balance handout provided to take home. Written material provided at class time.   Activity Barriers & Risk Stratification:  Activity Barriers & Cardiac Risk Stratification - 11/30/23 1548       Activity Barriers & Cardiac Risk  Stratification   Activity Barriers Joint Problems    Cardiac Risk Stratification High          6 Minute Walk:  6 Minute Walk     Row Name 11/30/23 1547         6 Minute Walk   Phase Initial     Distance 680 feet     Walk Time 4.33 minutes     # of Rest Breaks 2     MPH 1.79     METS 3.6     RPE 13     Perceived Dyspnea  2     VO2 Peak 12.7     Symptoms Yes (comment)     Comments bilater knee, and lower lag pain     Resting HR 76 bpm     Resting BP 132/86     Resting Oxygen Saturation  96 %     Exercise Oxygen Saturation  during 6 min walk 98 %     Max Ex. HR 121 bpm     Max Ex. BP 164/88     2 Minute Post BP 144/88        Oxygen Initial Assessment:   Oxygen Re-Evaluation:   Oxygen Discharge (Final Oxygen Re-Evaluation):   Initial Exercise Prescription:  Initial Exercise Prescription - 11/30/23 1500       Date of Initial Exercise RX and Referring Provider   Date 11/30/23    Referring Provider Dr. Virgene Cage      Oxygen   Oxygen --    Maintain Oxygen Saturation 88% or higher      Recumbant Bike   Level 3    RPM 50    Watts 25    Minutes 15    METs 3.6      Arm Ergometer   Level 2    Watts 25    RPM 25    Minutes 15    METs 3.6      T5 Nustep   Level 3    SPM 80    Minutes 15    METs 3.6      Prescription Details   Duration Progress to 30 minutes of continuous aerobic without signs/symptoms of physical distress      Intensity   THRR 40-80% of Max Heartrate 120-164    Ratings of Perceived Exertion 11-13    Perceived Dyspnea 0-4      Progression   Progression Continue to progress workloads to maintain intensity without signs/symptoms of physical distress.      Resistance Training   Training Prescription Yes    Weight 10lb    Reps 10-15          Perform Capillary Blood Glucose checks as needed.  Exercise Prescription Changes:   Exercise Prescription Changes     Row Name 11/30/23 1500 12/18/23 1100 01/03/24 1500  01/24/24 0800 01/31/24 1600     Response to Exercise   Blood Pressure (Admit) 132/86 142/90 130/80 158/74 164/100   Blood Pressure (Exercise) 164/88 180/100 160/74 210/110 162/94   Blood Pressure (Exit) 144/88 170/100 136/84 178/92 152/94   Heart Rate (Admit) 76 bpm 92  bpm 79 bpm 101 bpm 89 bpm   Heart Rate (Exercise) 121 bpm 119 bpm 126 bpm 124 bpm 129 bpm   Heart Rate (Exit) 80 bpm 97 bpm 94 bpm 101 bpm 100 bpm   Oxygen Saturation (Admit) 96 % -- -- -- --   Oxygen Saturation (Exercise) 98 % -- -- -- --   Oxygen Saturation (Exit) 98 % -- -- -- --   Rating of Perceived Exertion (Exercise) 13 13 13 13 12    Perceived Dyspnea (Exercise) 2 -- -- -- --   Symptoms bilteral knee, and lower leg pain none none none none   Comments results 1st 2 weeks of exercise sessions -- -- --   Duration -- Progress to 30 minutes of  aerobic without signs/symptoms of physical distress Continue with 30 min of aerobic exercise without signs/symptoms of physical distress. Continue with 30 min of aerobic exercise without signs/symptoms of physical distress. Continue with 30 min of aerobic exercise without signs/symptoms of physical distress.   Intensity -- THRR unchanged THRR unchanged THRR unchanged THRR unchanged     Progression   Progression -- Continue to progress workloads to maintain intensity without signs/symptoms of physical distress. Continue to progress workloads to maintain intensity without signs/symptoms of physical distress. Continue to progress workloads to maintain intensity without signs/symptoms of physical distress. Continue to progress workloads to maintain intensity without signs/symptoms of physical distress.   Average METs -- 3.3 5.03 4.9 3.56     Resistance Training   Training Prescription -- Yes Yes -- --   Weight -- 10lb 10 lb 10 lb 10 lb   Reps -- 10-15 10-15 10-15 10-15     Interval Training   Interval Training -- -- No No No     NuStep   Level -- 5 6 7 7    SPM -- 80 -- -- --    Minutes -- 15 15 15 15    METs -- 5.3 6 4.9 4.1     Arm Ergometer   Level -- 5 7 9 9    Watts -- -- -- -- 60   Minutes -- 15 15 15 15    METs -- 1 4.2 4.9 3.81     T5 Nustep   Level -- 5 -- 7 5   Minutes -- 15 -- 15 15   METs -- 2.2 -- -- 2.5     Oxygen   Maintain Oxygen Saturation -- 88% or higher 88% or higher 88% or higher 88% or higher      Exercise Comments:   Exercise Comments     Row Name 12/06/23 1414           Exercise Comments First full day of exercise!  Patient was oriented to gym and equipment including functions, settings, policies, and procedures.  Patient's individual exercise prescription and treatment plan were reviewed.  All starting workloads were established based on the results of the 6 minute walk test done at initial orientation visit.  The plan for exercise progression was also introduced and progression will be customized based on patient's performance and goals.          Exercise Goals and Review:   Exercise Goals     Row Name 11/30/23 1558             Exercise Goals   Increase Physical Activity Yes       Intervention Provide advice, education, support and counseling about physical activity/exercise needs.;Develop an individualized exercise prescription for aerobic and resistive training based  on initial evaluation findings, risk stratification, comorbidities and participant's personal goals.       Expected Outcomes Short Term: Attend rehab on a regular basis to increase amount of physical activity.;Long Term: Add in home exercise to make exercise part of routine and to increase amount of physical activity.;Long Term: Exercising regularly at least 3-5 days a week.       Increase Strength and Stamina Yes       Intervention Provide advice, education, support and counseling about physical activity/exercise needs.;Develop an individualized exercise prescription for aerobic and resistive training based on initial evaluation findings, risk  stratification, comorbidities and participant's personal goals.       Expected Outcomes Short Term: Increase workloads from initial exercise prescription for resistance, speed, and METs.;Short Term: Perform resistance training exercises routinely during rehab and add in resistance training at home;Long Term: Improve cardiorespiratory fitness, muscular endurance and strength as measured by increased METs and functional capacity ( )       Able to understand and use rate of perceived exertion (RPE) scale Yes       Intervention Provide education and explanation on how to use RPE scale       Expected Outcomes Short Term: Able to use RPE daily in rehab to express subjective intensity level;Long Term:  Able to use RPE to guide intensity level when exercising independently       Able to understand and use Dyspnea scale Yes       Intervention Provide education and explanation on how to use Dyspnea scale       Expected Outcomes Short Term: Able to use Dyspnea scale daily in rehab to express subjective sense of shortness of breath during exertion;Long Term: Able to use Dyspnea scale to guide intensity level when exercising independently       Knowledge and understanding of Target Heart Rate Range (THRR) Yes       Intervention Provide education and explanation of THRR including how the numbers were predicted and where they are located for reference       Expected Outcomes Short Term: Able to state/look up THRR;Short Term: Able to use daily as guideline for intensity in rehab;Long Term: Able to use THRR to govern intensity when exercising independently       Able to check pulse independently Yes       Intervention Provide education and demonstration on how to check pulse in carotid and radial arteries.;Review the importance of being able to check your own pulse for safety during independent exercise       Expected Outcomes Short Term: Able to explain why pulse checking is important during independent  exercise;Long Term: Able to check pulse independently and accurately       Understanding of Exercise Prescription Yes       Intervention Provide education, explanation, and written materials on patient's individual exercise prescription       Expected Outcomes Short Term: Able to explain program exercise prescription;Long Term: Able to explain home exercise prescription to exercise independently          Exercise Goals Re-Evaluation :  Exercise Goals Re-Evaluation     Row Name 12/06/23 1414 12/18/23 1146 01/03/24 1558 01/24/24 0853 01/31/24 1612     Exercise Goal Re-Evaluation   Exercise Goals Review Increase Physical Activity;Able to understand and use rate of perceived exertion (RPE) scale;Knowledge and understanding of Target Heart Rate Range (THRR);Understanding of Exercise Prescription;Increase Strength and Stamina;Able to understand and use Dyspnea scale;Able to check pulse independently Increase  Physical Activity;Understanding of Exercise Prescription;Increase Strength and Stamina Increase Physical Activity;Understanding of Exercise Prescription;Increase Strength and Stamina Increase Physical Activity;Understanding of Exercise Prescription;Increase Strength and Stamina Increase Physical Activity;Understanding of Exercise Prescription;Increase Strength and Stamina   Comments Reviewed RPE and dyspnea scale, THR and program prescription with pt today.  Pt voiced understanding and was given a copy of goals to take home. Shaurya is off to a good start in the program. He completed his first 2 weeks of exercise sessions in this review. He worked at level 5 on the T4 nustep, T5 nustep, and arm ergometer. We will continue to monitor his progress in the program. Osmel is doing well in the program. He improved to level 6 on the T4 nustep and level 7 on the arm ergometer. He has also done well with 10 lb hand weights for resistance training. We will continue to monitor his progress in the program.  Nyheim is doing well in the program. He improved to level 7 on both the T4 and T5 nustep machines. He also improved to level 9 on the arm crank. We will continue to monitor his progress in the program. Tzvi is doing well in rehab. He was able to maintain level 7 on the T4 nustep, and level 9 on the arm ergometer. We will continue to monitor his progress in the program.   Expected Outcomes Short: Use RPE daily to regulate intensity. Long: Follow program prescription in THR. Short: Continue to follow current exercise prescription. Long: Continue exercise to improve strength and stamina. Short: Continue to progressively increase workloads. Long: Continue exercise to improve strength and stamina. Short: Continue to progressively increase workloads. Long: Continue exercise to improve strength and stamina. Short: Continue to progressively increase workloads. Long: Continue exercise to improve strength and stamina.      Discharge Exercise Prescription (Final Exercise Prescription Changes):  Exercise Prescription Changes - 01/31/24 1600       Response to Exercise   Blood Pressure (Admit) 164/100    Blood Pressure (Exercise) 162/94    Blood Pressure (Exit) 152/94    Heart Rate (Admit) 89 bpm    Heart Rate (Exercise) 129 bpm    Heart Rate (Exit) 100 bpm    Rating of Perceived Exertion (Exercise) 12    Symptoms none    Duration Continue with 30 min of aerobic exercise without signs/symptoms of physical distress.    Intensity THRR unchanged      Progression   Progression Continue to progress workloads to maintain intensity without signs/symptoms of physical distress.    Average METs 3.56      Resistance Training   Weight 10 lb    Reps 10-15      Interval Training   Interval Training No      NuStep   Level 7    Minutes 15    METs 4.1      Arm Ergometer   Level 9    Watts 60    Minutes 15    METs 3.81      T5 Nustep   Level 5    Minutes 15    METs 2.5      Oxygen   Maintain  Oxygen Saturation 88% or higher          Nutrition:  Target Goals: Understanding of nutrition guidelines, daily intake of sodium 1500mg , cholesterol 200mg , calories 30% from fat and 7% or less from saturated fats, daily to have 5 or more servings of fruits and vegetables.  Education: Nutrition  1 -Group instruction provided by verbal, written material, interactive activities, discussions, models, and posters to present general guidelines for heart healthy nutrition including macronutrients, label reading, and promoting whole foods over processed counterparts. Education serves as pensions consultant of discussion of heart healthy eating for all. Written material provided at class time.    Education: Nutrition 2 -Group instruction provided by verbal, written material, interactive activities, discussions, models, and posters to present general guidelines for heart healthy nutrition including sodium, cholesterol, and saturated fat. Providing guidance of habit forming to improve blood pressure, cholesterol, and body weight. Written material provided at class time.     Biometrics:  Pre Biometrics - 11/30/23 1558       Pre Biometrics   Height 5' 8.1 (1.73 m)    Weight 254 lb 14.4 oz (115.6 kg)    Waist Circumference 46 inches    Hip Circumference 40 inches    Waist to Hip Ratio 1.15 %    BMI (Calculated) 38.63    Single Leg Stand 30 seconds           Nutrition Therapy Plan and Nutrition Goals:   Nutrition Assessments:  MEDIFICTS Score Key: >=70 Need to make dietary changes  40-70 Heart Healthy Diet <= 40 Therapeutic Level Cholesterol Diet   Picture Your Plate Scores: <59 Unhealthy dietary pattern with much room for improvement. 41-50 Dietary pattern unlikely to meet recommendations for good health and room for improvement. 51-60 More healthful dietary pattern, with some room for improvement.  >60 Healthy dietary pattern, although there may be some specific behaviors that could  be improved.    Nutrition Goals Re-Evaluation:   Nutrition Goals Discharge (Final Nutrition Goals Re-Evaluation):   Psychosocial: Target Goals: Acknowledge presence or absence of significant depression and/or stress, maximize coping skills, provide positive support system. Participant is able to verbalize types and ability to use techniques and skills needed for reducing stress and depression.   Education: Stress, Anxiety, and Depression - Group verbal and visual presentation to define topics covered.  Reviews how body is impacted by stress, anxiety, and depression.  Also discusses healthy ways to reduce stress and to treat/manage anxiety and depression. Written material provided at class time.   Education: Sleep Hygiene -Provides group verbal and written instruction about how sleep can affect your health.  Define sleep hygiene, discuss sleep cycles and impact of sleep habits. Review good sleep hygiene tips.   Initial Review & Psychosocial Screening:  Initial Psych Review & Screening - 11/23/23 1356       Initial Review   Current issues with Current Depression;Current Anxiety/Panic;Current Psychotropic Meds      Family Dynamics   Good Support System? Yes   patient feels supported but did not give specific individuals   Comments Siddhartha states he is excited to try cardiac rehab. He was hesitant regarding ability to come 2-3 times per week but states he will try. Aveion reports that he is currently taking medication for anxiety and depression and that he enjoys working out when he feels overwhelmed and stressed out. Aaryan reports that he currenlty smokes tobacco and that he uses marijuana and cocain occasionally, however states he is trying to limit his cocaine use due to recent heart event. He does report bilateral knee pain, but states it usually does not limit his quality of life.      Barriers   Psychosocial barriers to participate in program The patient should benefit from  training in stress management and relaxation.  Screening Interventions   Interventions Encouraged to exercise;To provide support and resources with identified psychosocial needs;Provide feedback about the scores to participant    Expected Outcomes Short Term goal: Utilizing psychosocial counselor, staff and physician to assist with identification of specific Stressors or current issues interfering with healing process. Setting desired goal for each stressor or current issue identified.;Long Term Goal: Stressors or current issues are controlled or eliminated.;Short Term goal: Identification and review with participant of any Quality of Life or Depression concerns found by scoring the questionnaire.;Long Term goal: The participant improves quality of Life and PHQ9 Scores as seen by post scores and/or verbalization of changes          Quality of Life Scores:   Scores of 19 and below usually indicate a poorer quality of life in these areas.  A difference of  2-3 points is a clinically meaningful difference.  A difference of 2-3 points in the total score of the Quality of Life Index has been associated with significant improvement in overall quality of life, self-image, physical symptoms, and general health in studies assessing change in quality of life.  PHQ-9: Review Flowsheet       11/30/2023 09/07/2017 09/26/2014  Depression screen PHQ 2/9  Decreased Interest 3 0 3  Down, Depressed, Hopeless 3 1 3   PHQ - 2 Score 6 1 6   Altered sleeping 3 - 2  Tired, decreased energy 3 - 0  Change in appetite 0 - 1  Feeling bad or failure about yourself  3 - 3  Trouble concentrating 3 - 0  Moving slowly or fidgety/restless 3 - 0  Suicidal thoughts 0 - 0   PHQ-9 Score 21 - 12   Difficult doing work/chores Extremely dIfficult - Not difficult at all    Details       Data saved with a previous flowsheet row definition        Interpretation of Total Score  Total Score Depression Severity:  1-4 =  Minimal depression, 5-9 = Mild depression, 10-14 = Moderate depression, 15-19 = Moderately severe depression, 20-27 = Severe depression   Psychosocial Evaluation and Intervention:  Psychosocial Evaluation - 11/23/23 1401       Psychosocial Evaluation & Interventions   Interventions Encouraged to exercise with the program and follow exercise prescription;Stress management education;Relaxation education    Comments Treavor states he is excited to try cardiac rehab. He was hesitant regarding ability to come 2-3 times per week but states he will try. Bonifacio reports that he is currently taking medication for anxiety and depression and that he enjoys working out when he feels overwhelmed and stressed out. Korvin reports that he currenlty smokes tobacco and that he uses marijuana and cocain occasionally, however states he is trying to limit his cocaine use due to recent heart event. He does report bilateral knee pain, but states it usually does not limit his quality of life.    Expected Outcomes Short: Attend cardiac rehab for education and exercise. Long: Develop and maintain positive self care habits.    Continue Psychosocial Services  Follow up required by staff          Psychosocial Re-Evaluation:   Psychosocial Discharge (Final Psychosocial Re-Evaluation):   Vocational Rehabilitation: Provide vocational rehab assistance to qualifying candidates.   Vocational Rehab Evaluation & Intervention:  Vocational Rehab - 11/23/23 1356       Initial Vocational Rehab Evaluation & Intervention   Assessment shows need for Vocational Rehabilitation No  Education: Education Goals: Education classes will be provided on a variety of topics geared toward better understanding of heart health and risk factor modification. Participant will state understanding/return demonstration of topics presented as noted by education test scores.  Learning Barriers/Preferences:  Learning  Barriers/Preferences - 11/23/23 1356       Learning Barriers/Preferences   Learning Barriers None    Learning Preferences None          General Cardiac Education Topics:  AED/CPR: - Group verbal and written instruction with the use of models to demonstrate the basic use of the AED with the basic ABC's of resuscitation.   Test and Procedures: - Group verbal and visual presentation and models provide information about basic cardiac anatomy and function. Reviews the testing methods done to diagnose heart disease and the outcomes of the test results. Describes the treatment choices: Medical Management, Angioplasty, or Coronary Bypass Surgery for treating various heart conditions including Myocardial Infarction, Angina, Valve Disease, and Cardiac Arrhythmias. Written material provided at class time.   Medication Safety: - Group verbal and visual instruction to review commonly prescribed medications for heart and lung disease. Reviews the medication, class of the drug, and side effects. Includes the steps to properly store meds and maintain the prescription regimen. Written material provided at class time.   Intimacy: - Group verbal instruction through game format to discuss how heart and lung disease can affect sexual intimacy. Written material provided at class time.   Know Your Numbers and Heart Failure: - Group verbal and visual instruction to discuss disease risk factors for cardiac and pulmonary disease and treatment options.  Reviews associated critical values for Overweight/Obesity, Hypertension, Cholesterol, and Diabetes.  Discusses basics of heart failure: signs/symptoms and treatments.  Introduces Heart Failure Zone chart for action plan for heart failure. Written material provided at class time.   Infection Prevention: - Provides verbal and written material to individual with discussion of infection control including proper hand washing and proper equipment cleaning during  exercise session. Flowsheet Row Cardiac Rehab from 11/30/2023 in [facility] Cardiac and Pulmonary Rehab  Date 11/30/23  Educator [facility]  Instruction Review Code 1- Verbalizes Understanding    Falls Prevention: - Provides verbal and written material to individual with discussion of falls prevention and safety. Flowsheet Row Cardiac Rehab from 11/30/2023 in [facility] Cardiac and Pulmonary Rehab  Date 11/30/23  Educator [facility]  Instruction Review Code 1- Verbalizes Understanding    Other: -Provides group and verbal instruction on various topics (see comments)   Knowledge Questionnaire Score:   Core Components/Risk Factors/Patient Goals at Admission:  Personal Goals and Risk Factors at Admission - 11/23/23 1354       Core Components/Risk Factors/Patient Goals on Admission    Weight Management Yes;Weight Loss    Intervention Weight Management: Develop a combined nutrition and exercise program designed to reach desired caloric intake, while maintaining appropriate intake of nutrient and fiber, sodium and fats, and appropriate energy expenditure required for the weight goal.;Weight Management: Provide education and appropriate resources to help participant work on and attain dietary goals.;Weight Management/Obesity: Establish reasonable short term and long term weight goals.;Obesity: Provide education and appropriate resources to help participant work on and attain dietary goals.    Goal Weight: Short Term 225 lb (102.1 kg)    Goal Weight: Long Term 200 lb (90.7 kg)    Expected Outcomes Short Term: Continue to assess and modify interventions until short term weight is achieved;Long Term: Adherence to nutrition and physical activity/exercise program aimed toward  attainment of established weight goal;Weight Loss: Understanding of general recommendations for a balanced deficit meal plan, which promotes 1-2 lb weight loss per week and includes a negative energy balance of [phone] kcal/d;Understanding recommendations  for meals to include 15-35% energy as protein, 25-35% energy from fat, 35-60% energy from carbohydrates, less than 200mg  of dietary cholesterol, 20-35 gm of total fiber daily;Understanding of distribution of calorie intake throughout the day with the consumption of 4-5 meals/snacks    Tobacco Cessation Yes    Number of packs per day 0.5    Intervention Assist the participant in steps to quit. Provide individualized education and counseling about committing to Tobacco Cessation, relapse prevention, and pharmacological support that can be provided by physician.;Education officer, environmental, assist with locating and accessing local/national Quit Smoking programs, and support quit date choice.    Expected Outcomes Short Term: Will demonstrate readiness to quit, by selecting a quit date.;Long Term: Complete abstinence from all tobacco products for at least 12 months from quit date.;Short Term: Will quit all tobacco product use, adhering to prevention of relapse plan.    Heart Failure Yes    Intervention Provide a combined exercise and nutrition program that is supplemented with education, support and counseling about heart failure. Directed toward relieving symptoms such as shortness of breath, decreased exercise tolerance, and extremity edema.    Expected Outcomes Improve functional capacity of life;Short term: Attendance in program 2-3 days a week with increased exercise capacity. Reported lower sodium intake. Reported increased fruit and vegetable intake. Reports medication compliance.;Short term: Daily weights obtained and reported for increase. Utilizing diuretic protocols set by physician.;Long term: Adoption of self-care skills and reduction of barriers for early signs and symptoms recognition and intervention leading to self-care maintenance.    Hypertension Yes    Intervention Provide education on lifestyle modifcations including regular physical activity/exercise, weight management, moderate sodium  restriction and increased consumption of fresh fruit, vegetables, and low fat dairy, alcohol moderation, and smoking cessation.;Monitor prescription use compliance.    Expected Outcomes Short Term: Continued assessment and intervention until BP is < 140/62mm HG in hypertensive participants. < 130/34mm HG in hypertensive participants with diabetes, heart failure or chronic kidney disease.;Long Term: Maintenance of blood pressure at goal levels.          Education:Diabetes - Individual verbal and written instruction to review signs/symptoms of diabetes, desired ranges of glucose level fasting, after meals and with exercise. Acknowledge that pre and post exercise glucose checks will be done for 3 sessions at entry of program.   Core Components/Risk Factors/Patient Goals Review:    Core Components/Risk Factors/Patient Goals at Discharge (Final Review):    ITP Comments:  ITP Comments     Row Name 11/23/23 1345 11/30/23 1546 12/06/23 1414 01/10/24 0930 02/07/24 1106   ITP Comments Initial phone call completed. Diagnosis can be found in [facility] 10/11/23. EP Orientation scheduled for 11/30/23 at 14:00.  Frederic is a current tobacco user. Intervention for tobacco cessation was provided at the initial medical review. He was asked about readiness to quit and reported he has decreased use but is not ready to quit completely . Patient was advised and educated about tobacco cessation using combination therapy, tobacco cessation classes, quit line, and quit smoking apps. Patient demonstrated understanding of this material. Staff will continue to provide encouragement and follow up with the patient throughout the program. Completed and gym orientation for cardiac rehab. Initial ITP created and sent for review to Dr. Oneil Pinal, Medical Director. First full  day of exercise!  Patient was oriented to gym and equipment including functions, settings, policies, and procedures.  Patient's individual exercise  prescription and treatment plan were reviewed.  All starting workloads were established based on the results of the 6 minute walk test done at initial orientation visit.  The plan for exercise progression was also introduced and progression will be customized based on patient's performance and goals. 30 Day review completed. Medical Director ITP review done, changes made as directed, and signed approval by Medical Director. New to program. 30 Day review completed. Medical Director ITP review done, changes made as directed, and signed approval by Medical Director.      Comments: 30 Day Review     [1]  Current Outpatient Medications:    aspirin  EC 81 MG tablet, Take 1 tablet (81 mg total) by mouth daily. Swallow whole., Disp: 30 tablet, Rfl: 12   atorvastatin  (LIPITOR) 40 MG tablet, Take 1 tablet (40 mg total) by mouth daily., Disp: 30 tablet, Rfl: 5   carvedilol  (COREG ) 6.25 MG tablet, Take 1 tablet (6.25 mg total) by mouth 2 (two) times daily., Disp: 60 tablet, Rfl: 5   clopidogrel  (PLAVIX ) 75 MG tablet, Take 1 tablet (75 mg total) by mouth daily with breakfast., Disp: 30 tablet, Rfl: 5   empagliflozin  (JARDIANCE ) 10 MG TABS tablet, Take 1 tablet (10 mg total) by mouth daily., Disp: 30 tablet, Rfl: 5   furosemide  (LASIX ) 40 MG tablet, Take 1 tablet (40 mg total) by mouth daily., Disp: 30 tablet, Rfl: 5   sacubitril -valsartan  (ENTRESTO ) 49-51 MG, Take 1 tablet by mouth 2 (two) times daily., Disp: 60 tablet, Rfl: 5   spironolactone  (ALDACTONE ) 25 MG tablet, Take 1 tablet (25 mg total) by mouth daily., Disp: 30 tablet, Rfl: 5   Venlafaxine HCl 37.5 MG TB24, Take 37.5 mg by mouth daily at 12 noon., Disp: , Rfl:  [2]  Social History Tobacco Use  Smoking Status Every Day   Current packs/day: 0.50   Types: Cigarettes  Smokeless Tobacco Never

## 2024-02-12 ENCOUNTER — Encounter: Admitting: Emergency Medicine

## 2024-02-12 DIAGNOSIS — Z5189 Encounter for other specified aftercare: Secondary | ICD-10-CM | POA: Diagnosis not present

## 2024-02-12 DIAGNOSIS — I214 Non-ST elevation (NSTEMI) myocardial infarction: Secondary | ICD-10-CM

## 2024-02-12 NOTE — Progress Notes (Signed)
 Daily Session Note  Patient Details  Name: IRISH PIECH MRN: [id number] Date of Birth: 07-12-1990 Referring Provider:   Flowsheet Row Cardiac Rehab from 11/30/2023 in [facility] Cardiac and Pulmonary Rehab  Referring Provider Dr. Virgene Cage    Encounter Date: 02/12/2024  Check In:  Session Check In - 02/12/24 1405       Check-In   Supervising physician immediately available to respond to emergencies See telemetry face sheet for immediately available ER MD    Location [facility]-Cardiac & Pulmonary Rehab    Staff Present Leita Franks RN,BSN;Maxon Burnell BS, Exercise Physiologist;Margaret Best, MS, Exercise Physiologist;Noah Tickle, BS, Exercise Physiologist    Virtual Visit No    Medication changes reported     No    Fall or balance concerns reported    No    Warm-up and Cool-down Performed on first and last piece of equipment    Resistance Training Performed Yes    VAD Patient? No    PAD/SET Patient? No      Pain Assessment   Currently in Pain? No/denies             Tobacco Use History[1]  Goals Met:  Independence with exercise equipment Exercise tolerated well No report of concerns or symptoms today Strength training completed today  Goals Unmet:  Not Applicable  Comments: Pt able to follow exercise prescription today without complaint.  Will continue to monitor for progression.    Dr. Oneil Pinal is Medical Director for [facility] Cardiac Rehabilitation.  Dr. Fuad Aleskerov is Medical Director for [facility] Pulmonary Rehabilitation.    [1]  Social History Tobacco Use  Smoking Status Every Day   Current packs/day: 0.50   Types: Cigarettes  Smokeless Tobacco Never

## 2024-02-14 ENCOUNTER — Encounter

## 2024-02-14 DIAGNOSIS — I214 Non-ST elevation (NSTEMI) myocardial infarction: Secondary | ICD-10-CM

## 2024-02-14 DIAGNOSIS — Z5189 Encounter for other specified aftercare: Secondary | ICD-10-CM | POA: Diagnosis not present

## 2024-02-14 NOTE — Progress Notes (Signed)
 Daily Session Note  Patient Details  Name: Terrance Hawkins MRN: [id number] Date of Birth: [birth year] Referring Provider:   Flowsheet Row Cardiac Rehab from 11/30/2023 in [facility] Cardiac and Pulmonary Rehab  Referring Provider Dr. Virgene Cage    Encounter Date: 02/14/2024  Check In:  Session Check In - 02/14/24 1438       Check-In   Supervising physician immediately available to respond to emergencies See telemetry face sheet for immediately available ER MD    Location [facility]-Cardiac & Pulmonary Rehab    Staff Present Burnard Davenport RN,BSN,MPA;Joseph Rolinda RCP,RRT,BSRT;Laura Cates RN,BSN;Daeron Carreno Dyane BS, ACSM CEP, Exercise Physiologist;Noah Tickle, BS, Exercise Physiologist    Virtual Visit No    Medication changes reported     No    Fall or balance concerns reported    No    Warm-up and Cool-down Performed on first and last piece of equipment    Resistance Training Performed Yes    VAD Patient? No    PAD/SET Patient? No      Pain Assessment   Currently in Pain? No/denies             Tobacco Use History[1]  Goals Met:  Independence with exercise equipment Exercise tolerated well No report of concerns or symptoms today Strength training completed today  Goals Unmet:  Not Applicable  Comments: Pt able to follow exercise prescription today without complaint.  Will continue to monitor for progression.    Dr. Oneil Pinal is Medical Director for [facility] Cardiac Rehabilitation.  Dr. Fuad Aleskerov is Medical Director for [facility] Pulmonary Rehabilitation.    [1]  Social History Tobacco Use  Smoking Status Every Day   Current packs/day: 0.50   Types: Cigarettes  Smokeless Tobacco Never

## 2024-02-19 ENCOUNTER — Encounter

## 2024-02-21 ENCOUNTER — Encounter

## 2024-02-26 ENCOUNTER — Encounter

## 2024-02-28 ENCOUNTER — Encounter: Attending: Cardiovascular Disease

## 2024-03-04 ENCOUNTER — Encounter

## 2024-03-06 ENCOUNTER — Encounter

## 2024-03-11 ENCOUNTER — Encounter

## 2024-03-13 ENCOUNTER — Encounter

## 2024-03-18 ENCOUNTER — Encounter

## 2024-03-20 ENCOUNTER — Encounter

## 2024-03-25 ENCOUNTER — Encounter: Attending: Cardiovascular Disease

## 2024-03-27 ENCOUNTER — Encounter
